# Patient Record
Sex: Male | Born: 1950 | Race: Black or African American | Hispanic: No | Marital: Married | Smoking: Former smoker
Health system: Southern US, Community
[De-identification: ages and names within clinical notes are randomized; demographics above are authoritative.]

## PROBLEM LIST (undated history)

## (undated) DIAGNOSIS — E785 Hyperlipidemia, unspecified: Secondary | ICD-10-CM

## (undated) DIAGNOSIS — I10 Essential (primary) hypertension: Secondary | ICD-10-CM

## (undated) DIAGNOSIS — Z720 Tobacco use: Secondary | ICD-10-CM

## (undated) DIAGNOSIS — E0590 Thyrotoxicosis, unspecified without thyrotoxic crisis or storm: Secondary | ICD-10-CM

## (undated) HISTORY — DX: Thyrotoxicosis, unspecified without thyrotoxic crisis or storm: E05.90

## (undated) HISTORY — DX: Hyperlipidemia, unspecified: E78.5

## (undated) HISTORY — PX: DENTAL SURGERY: SHX609

## (undated) HISTORY — PX: OTHER SURGICAL HISTORY: SHX169

## (undated) HISTORY — DX: Essential (primary) hypertension: I10

## (undated) HISTORY — DX: Tobacco use: Z72.0

---

## 2010-10-16 ENCOUNTER — Encounter: Payer: Self-pay | Admitting: Internal Medicine

## 2010-10-22 LAB — CONVERTED CEMR LAB
AST: 29 units/L
Albumin: 5 g/dL
Cholesterol: 279 mg/dL
Glucose, Bld: 144 mg/dL
HDL: 31 mg/dL
Hgb A1c MFr Bld: 8.9 %
PSA: 1.1 ng/mL
Protein, U semiquant: 53
Specific Gravity, Urine: 1.029
Total Protein: 7.3 g/dL
Triglyceride fasting, serum: 390 mg/dL

## 2010-11-08 ENCOUNTER — Ambulatory Visit: Payer: Self-pay | Admitting: Internal Medicine

## 2010-11-08 DIAGNOSIS — J309 Allergic rhinitis, unspecified: Secondary | ICD-10-CM | POA: Insufficient documentation

## 2010-11-08 DIAGNOSIS — E1169 Type 2 diabetes mellitus with other specified complication: Secondary | ICD-10-CM | POA: Insufficient documentation

## 2010-11-08 DIAGNOSIS — M1990 Unspecified osteoarthritis, unspecified site: Secondary | ICD-10-CM | POA: Insufficient documentation

## 2010-11-08 DIAGNOSIS — E785 Hyperlipidemia, unspecified: Secondary | ICD-10-CM | POA: Insufficient documentation

## 2010-11-08 DIAGNOSIS — K219 Gastro-esophageal reflux disease without esophagitis: Secondary | ICD-10-CM | POA: Insufficient documentation

## 2011-01-09 NOTE — Assessment & Plan Note (Signed)
Summary: New / Bcbs/ # cd   Vital Signs:  Patient profile:   61 year old male Height:      71 inches Weight:      252.50 pounds BMI:     35.34 O2 Sat:      97 % on Room air Temp:     98.5 degrees F oral Pulse rate:   64 / minute Pulse rhythm:   regular Resp:     16 per minute BP sitting:   126 / 80  (left arm) Cuff size:   large  Vitals Entered By: Rock Nephew CMA (November 08, 2010 10:15 AM)  Nutrition Counseling: Patient's BMI is greater than 25 and therefore counseled on weight management options.  O2 Flow:  Room air CC: New to establish Is Patient Diabetic? Yes Did you bring your meter with you today? No Pain Assessment Patient in pain? no       Does patient need assistance? Functional Status Self care Ambulation Normal   Primary Care Provider:  Etta Grandchild MD  CC:  New to establish.  History of Present Illness: New to me he was found to have Type II DM on recent labs done for a life insurance physical ( see scanned documents). He feels well and has no complaints today.  Preventive Screening-Counseling & Management  Alcohol-Tobacco     Alcohol drinks/day: 0     Alcohol Counseling: not indicated; patient does not drink     Smoking Status: current     Tobacco Counseling: to quit use of tobacco products  Caffeine-Diet-Exercise     Does Patient Exercise: yes  Hep-HIV-STD-Contraception     Hepatitis Risk: no risk noted     HIV Risk: no risk noted     STD Risk: no risk noted      Sexual History:  currently monogamous.        Drug Use:  no.        Blood Transfusions:  no.    Clinical Review Panels:  Prevention   Last Colonoscopy:  Normal (08/24/2008)   Last PSA:  1.1 (10/22/2010)  Lipid Management   Cholesterol:  279 (10/22/2010)   LDL (bad choesterol):  170 (10/22/2010)   HDL (good cholesterol):  31 (10/22/2010)   Triglycerides:  390 (10/22/2010)  Diabetes Management   HgBA1C:  8.9 (10/22/2010)   Creatinine:  1.0 (10/22/2010)   Last  Foot Exam:  yes (11/08/2010)  Complete Metabolic Panel   Glucose:  144 (10/22/2010)   BUN:  10 (10/22/2010)   Creatinine:  1.0 (10/22/2010)   Albumin:  5.0 (10/22/2010)   Total Protein:  7.3 (10/22/2010)   Total Bili:  0.4 (10/22/2010)   Alk Phos:  67 (10/22/2010)   SGPT (ALT):  31 (10/22/2010)   SGOT (AST):  29 (10/22/2010)   -  Date:  10/22/2010    BUN: 10    Creatinine: 1.0    Cholesterol: 279    LDL: 170    HDL: 31    Triglycerides: 161    HgbA1c: 8.9    Alk Phos: 67    SGPT (ALT): 31    SGOT (AST): 29    GGT: 36    Bilirubin-total: 0.4    BG Random: 144    Total Protein: 7.3    Albumin: 5.0    CMP Comment: cholesterol ratio- 9.0, Fructosamine-2.2, LDL/HDL ratio-5.48, Globulin 2.3,     Specific Gravity: 1.029    Protein: 53    PSA: 1.1    Urinalysis  Comments: Cotinine- 1.09,cocaine -Neg, Microalbumin/creatin-198, Protein/creatinine 0.3533, Microalbuminuria 29.7, Adult Creatinine-150.0   Medications Prior to Update: 1)  None  Current Medications (verified): 1)  Janumet 50-500 Mg Tabs (Sitagliptin-Metformin Hcl) .... One By Mouth Two Times A Day For Diabetes 2)  Crestor 20 Mg Tabs (Rosuvastatin Calcium) .... One By Mouth Once Daily For Cholesterol 3)  Onetouch Ultra 2 W/device Kit (Blood Glucose Monitoring Suppl) .... Use Two Times A Day As Directed  Allergies (verified): No Known Drug Allergies  Past History:  Past Medical History: Allergic rhinitis GERD Hyperlipidemia Osteoarthritis  Past Surgical History: Denies surgical history  Family History: Family History of Alcoholism/Addiction Family History Hypertension Family History of diabetes Family History of Heart disease Family History of Colon CA 1st degree relative age 54  Social History: Occupation: truck Hospital doctor Married Current Smoker Alcohol use-no Drug use-no Regular exercise-yes Smoking Status:  current Hepatitis Risk:  no risk noted HIV Risk:  no risk noted STD Risk:  no risk  noted Sexual History:  currently monogamous Blood Transfusions:  no Drug Use:  no Does Patient Exercise:  yes  Review of Systems       The patient complains of weight gain.  The patient denies anorexia, fever, weight loss, chest pain, syncope, dyspnea on exertion, peripheral edema, prolonged cough, headaches, hemoptysis, abdominal pain, melena, hematochezia, severe indigestion/heartburn, hematuria, and suspicious skin lesions.   Endo:  Denies cold intolerance, excessive hunger, excessive thirst, excessive urination, heat intolerance, polyuria, and weight change.  Physical Exam  General:  alert, well-developed, well-nourished, well-hydrated, appropriate dress, normal appearance, healthy-appearing, cooperative to examination, and overweight-appearing.   Head:  normocephalic, atraumatic, no abnormalities observed, and no abnormalities palpated.   Eyes:  vision grossly intact, pupils equal, and no injection.   Nose:  External nasal examination shows no deformity or inflammation. Nasal mucosa are pink and moist without lesions or exudates. Mouth:  pharynx pink and moist, no erythema, no exudates, no posterior lymphoid hypertrophy, no postnasal drip, no pharyngeal crowing, no lesions, no aphthous ulcers, no erosions, no tongue abnormalities, no leukoplakia, no petechiae, and edentulous.   Neck:  supple, full ROM, no masses, no thyromegaly, no JVD, normal carotid upstroke, no carotid bruits, no cervical lymphadenopathy, and no neck tenderness.   Lungs:  normal respiratory effort, no intercostal retractions, no accessory muscle use, normal breath sounds, no dullness, no fremitus, no crackles, and no wheezes.   Heart:  normal rate, regular rhythm, no murmur, no gallop, and no rub.   Abdomen:  soft, non-tender, normal bowel sounds, no distention, no masses, no guarding, no rigidity, no rebound tenderness, no abdominal hernia, no inguinal hernia, no hepatomegaly, and no splenomegaly.   Msk:  normal ROM,  no joint tenderness, no joint swelling, no joint warmth, no redness over joints, no joint deformities, no joint instability, and no crepitation.   Pulses:  R and L carotid,radial,femoral,dorsalis pedis and posterior tibial pulses are full and equal bilaterally Extremities:  No clubbing, cyanosis, edema, or deformity noted with normal full range of motion of all joints.   Neurologic:  No cranial nerve deficits noted. Station and gait are normal. Plantar reflexes are down-going bilaterally. DTRs are symmetrical throughout. Sensory, motor and coordinative functions appear intact. Skin:  turgor normal, color normal, no rashes, no suspicious lesions, no ecchymoses, no petechiae, no purpura, no ulcerations, and no edema.   Cervical Nodes:  no anterior cervical adenopathy and no posterior cervical adenopathy.   Axillary Nodes:  no R axillary adenopathy and no L axillary adenopathy.  Inguinal Nodes:  no R inguinal adenopathy and no L inguinal adenopathy.   Psych:  Cognition and judgment appear intact. Alert and cooperative with normal attention span and concentration. No apparent delusions, illusions, hallucinations  Diabetes Management Exam:    Foot Exam (with socks and/or shoes not present):       Sensory-Pinprick/Light touch:          Left medial foot (L-4): normal          Left dorsal foot (L-5): normal          Left lateral foot (S-1): normal          Right medial foot (L-4): normal          Right dorsal foot (L-5): normal          Right lateral foot (S-1): normal       Sensory-Monofilament:          Left foot: normal          Right foot: normal       Inspection:          Left foot: normal          Right foot: normal       Nails:          Left foot: normal          Right foot: normal   Impression & Recommendations:  Problem # 1:  DIABETES-TYPE 2 (ICD-250.00) Assessment New  His updated medication list for this problem includes:    Janumet 50-500 Mg Tabs (Sitagliptin-metformin hcl)  ..... One by mouth two times a day for diabetes  Orders: Diabetic Clinic Referral (Diabetic) Ophthalmology Referral (Ophthalmology)  Labs Reviewed: Creat: 1.0 (10/22/2010)    Reviewed HgBA1c results: 8.9 (10/22/2010)  Problem # 2:  HYPERLIPIDEMIA (ICD-272.4) Assessment: New  His updated medication list for this problem includes:    Crestor 20 Mg Tabs (Rosuvastatin calcium) ..... One by mouth once daily for cholesterol  Labs Reviewed: SGOT: 29 (10/22/2010)   SGPT: 31 (10/22/2010)   HDL:31 (10/22/2010)  LDL:170 (10/22/2010)  Chol:279 (10/22/2010)  Trig:390 (10/22/2010)  Problem # 3:  GERD (ICD-530.81) Assessment: Improved  His updated medication list for this problem includes:    Prevacid 24hr 15 Mg Cpdr (Lansoprazole) ..... One by mouth once daily  Complete Medication List: 1)  Janumet 50-500 Mg Tabs (Sitagliptin-metformin hcl) .... One by mouth two times a day for diabetes 2)  Crestor 20 Mg Tabs (Rosuvastatin calcium) .... One by mouth once daily for cholesterol 3)  Onetouch Ultra 2 W/device Kit (Blood glucose monitoring suppl) .... Use two times a day as directed 4)  Prevacid 24hr 15 Mg Cpdr (Lansoprazole) .... One by mouth once daily  Colorectal Screening:  Colonoscopy Results:    Date of Exam: 08/24/2008    Results: Normal  PSA Screening:    PSA: 1.1  (10/22/2010)  Patient Instructions: 1)  Please schedule a follow-up appointment in 2 months. 2)  It is important that you exercise regularly at least 20 minutes 5 times a week. If you develop chest pain, have severe difficulty breathing, or feel very tired , stop exercising immediately and seek medical attention. 3)  You need to lose weight. Consider a lower calorie diet and regular exercise.  4)  Check your blood sugars regularly. If your readings are usually above 200 or below 70 you should contact our office. 5)  It is important that your Diabetic A1c level is checked every 3 months. 6)  See  your eye doctor yearly to  check for diabetic eye damage. 7)  Check your feet each night for sore areas, calluses or signs of infection. 8)  Check your Blood Pressure regularly. If it is above 130/80: you should make an appointment. Prescriptions: ONETOUCH ULTRA 2 W/DEVICE KIT (BLOOD GLUCOSE MONITORING SUPPL) Use two times a day as directed  #1 x 0   Entered and Authorized by:   Etta Grandchild MD   Signed by:   Etta Grandchild MD on 11/08/2010   Method used:   Samples Given   RxID:   [id number] CRESTOR 20 MG TABS (ROSUVASTATIN CALCIUM) one by mouth once daily for cholesterol  #77 x 0   Entered and Authorized by:   Etta Grandchild MD   Signed by:   Etta Grandchild MD on 11/08/2010   Method used:   Samples Given   RxID:   [id number] JANUMET 50-500 MG TABS (SITAGLIPTIN-METFORMIN HCL) one by mouth two times a day for diabetes  #112 x 0   Entered and Authorized by:   Etta Grandchild MD   Signed by:   Etta Grandchild MD on 11/08/2010   Method used:   Samples Given   RxID:   [id number]    Orders Added: 1)  Diabetic Clinic Referral [Diabetic] 2)  Ophthalmology Referral [Ophthalmology] 3)  New Patient Level IV [99204]   Not Administered:    Tetanus Vaccine not given due to: declined    Pneumonia Vaccine not given due to: declined    Influenza Vaccine not given due to: declined

## 2011-01-23 ENCOUNTER — Ambulatory Visit: Payer: Self-pay | Admitting: Internal Medicine

## 2011-11-23 ENCOUNTER — Ambulatory Visit (INDEPENDENT_AMBULATORY_CARE_PROVIDER_SITE_OTHER): Payer: Self-pay | Admitting: Internal Medicine

## 2011-11-23 ENCOUNTER — Encounter: Payer: Self-pay | Admitting: Internal Medicine

## 2011-11-23 VITALS — BP 161/99 | HR 86 | Temp 97.3°F | Ht 71.0 in | Wt 250.6 lb

## 2011-11-23 DIAGNOSIS — E785 Hyperlipidemia, unspecified: Secondary | ICD-10-CM

## 2011-11-23 DIAGNOSIS — I10 Essential (primary) hypertension: Secondary | ICD-10-CM

## 2011-11-23 DIAGNOSIS — E119 Type 2 diabetes mellitus without complications: Secondary | ICD-10-CM

## 2011-11-23 DIAGNOSIS — L03019 Cellulitis of unspecified finger: Secondary | ICD-10-CM | POA: Insufficient documentation

## 2011-11-23 MED ORDER — AMOXICILLIN 500 MG PO TABS: 500.0000 mg | ORAL_TABLET | Freq: Two times a day (BID) | ORAL | Status: DC

## 2011-11-23 MED ORDER — LISINOPRIL 20 MG PO TABS: 20.0000 mg | ORAL_TABLET | Freq: Every day | ORAL | Status: DC

## 2011-11-23 MED ORDER — GLIPIZIDE 10 MG PO TABS: 10.0000 mg | ORAL_TABLET | Freq: Every day | ORAL | Status: DC

## 2011-11-23 MED ORDER — ASPIRIN EC 81 MG PO TBEC: 81.0000 mg | DELAYED_RELEASE_TABLET | Freq: Every day | ORAL | Status: DC

## 2011-11-23 MED ORDER — METFORMIN HCL 1000 MG PO TABS: 1000.0000 mg | ORAL_TABLET | Freq: Two times a day (BID) | ORAL | Status: DC

## 2011-11-23 MED ORDER — DOXYCYCLINE HYCLATE 100 MG PO TABS: 100.0000 mg | ORAL_TABLET | Freq: Two times a day (BID) | ORAL | Status: DC

## 2011-11-23 MED ORDER — PRAVASTATIN SODIUM 40 MG PO TABS: 40.0000 mg | ORAL_TABLET | Freq: Every day | ORAL | Status: DC

## 2011-11-23 MED ORDER — GLIPIZIDE 5 MG PO TABS: 5.0000 mg | ORAL_TABLET | Freq: Two times a day (BID) | ORAL | Status: DC

## 2011-11-23 NOTE — Progress Notes (Signed)
Addended by: Lyn Hollingshead C on: 11/23/2011 03:12 PM   Modules accepted: Orders

## 2011-11-23 NOTE — Assessment & Plan Note (Signed)
Lab Results  Component Value Date   HGBA1C 10.8 11/23/2011   HGBA1C 8.9 10/22/2010   CREATININE 1.0 10/22/2010   CHOL 279 10/22/2010   HDL 31 10/22/2010    Last eye exam and foot exam: No results found for this basename: HMDIABEYEEXA, HMDIABFOOTEX    Assessment: Diabetes control: not controlled Progress toward goals: deteriorated Barriers to meeting goals: nonadherence to medications  Plan: Diabetes treatment: Start on metformin thousand milligram by mouth twice a day and glipizide 5 mg by mouth daily. I would refer to [facility]- but would wait until he has orange card. I did do referral today already. Refer to: diabetes educator for self-management training, diabetes educator for medical nutrition therapy and nutritionist Instruction/counseling given: reminded to get eye exam, reminded to bring medications to each visit and discussed the need for weight loss

## 2011-11-23 NOTE — Assessment & Plan Note (Addendum)
Lab Results  Component Value Date   BUN 10 10/22/2010   CREATININE 1.0 10/22/2010    BP Readings from Last 3 Encounters:  11/23/11 161/99  11/08/10 126/80    Assessment: Hypertension control:  moderately elevated  Progress toward goals:  deteriorated Barriers to meeting goals:  nonadherence to medications  Plan: Hypertension treatment:  Start on lisinopril 20 mg daily considering his diabetes. I will check BMP today and due to problem with needle sticks- I will see him back in 3 months and we'll recheck his creatinine at that time- provided his creatinine today is within normal limits.

## 2011-11-23 NOTE — Assessment & Plan Note (Signed)
Check lipid profile today. Start Pravachol 40 mg daily.

## 2011-11-23 NOTE — Patient Instructions (Signed)
Please make an appointment in 3 months.  Finger infection:   Soak finger in warm water for about 10 minutes every 3 hours. Also start taking amoxicillin 500 mg one tablet twice daily and doxycycline 100 mg one tablet twice daily- both for total of 7 days. If the finger starts having more or gets swollen or red and tense again, give [name] a call to make an early appointment.  Diabetes :              Start taking metformin 1000 mg one tablet at breakfast and one tablet at supper.              Also start taking glipizide 5 mg one tablet at breakfast for diabetes.  High blood pressure Start taking lisinopril 20 mg one tablet daily.  High cholesterol  Start taking Pravachol- 40 mg one tablet daily.

## 2011-11-23 NOTE — Assessment & Plan Note (Signed)
As described in history of present illness, Carlos Bowman has acute paronychia of right middle finger- which is getting better. He soaked his finger in Epsom salt at home. 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 Also advised him to soak his finger in warm water for 10 minutes every 3 hours. No indication for surgery at present- but described him and his wife that if it gets worse give [name] a call and we'll do the surgical referral.

## 2011-11-23 NOTE — Progress Notes (Signed)
  Subjective:    Patient ID: Carlos Bowman, male    DOB: Jan 04, 1951, 60 y.o.   MRN: [id number]  HPI Carlos Bowman is a pleasant 60 year old man who comes to the clinic as a new patient, has history of DM 2, hyper lipidemia, osteoarthritis. He comes today with distal right middle finger pain and swelling.  He noticed the pain and swelling along with redness of his right middle finger on medial side of his nailbed on last Sunday- it got bigger and tense and more red and was tender to touch until this morning. Today he feels much better and the pain is down, is less tense than before and less red. He denies any fever, chills, nausea, vomiting. He did try to stick needle at the prominent white part on the medial side of the nail bed- thinking to get the pus out- but was unsuccessful. He used alcohol to sterilize the needle and skin before doing it. Advised him not to do in again in future.  He has history of diabetes with last HbA1c of 8.7 in November 2011. Rechecked today- 10.8. Has history of hyperlipidemia- last total cholesterol 279 and LDL cholesterol 170 with HDL 31. He is not on any medications for past 6 months or so due to financial issues.  He is really afraid of needles sticks- since early age when he had few incidences of needles breaking into the skin.  Since then he tries to avoid lab tests if possible and also does not take any immunizations.  He is accompanied by his wife today.  He denies any nausea vomiting, abdominal pain, diarrhea, constipation, headache, chest pain, shortness of breath or recent weight loss.   Review of Systems    as per history of present illness, all other systems reviewed and negative. Objective:   Physical Exam  Vitals: Reviewed. General: resting in bed HEENT: PERRL, EOMI, no scleral icterus Cardiac: S1, S2, RRR, no rubs, murmurs or gallops Pulm: clear to auscultation bilaterally, moving normal volumes of air Abd: soft, nontender, nondistended, BS  present Ext: Right middle finger- distal acute paronychia - medial side of nailbed , minimal redness and minimal pain. Not tense. No open wound . Neuro: alert and oriented X3, cranial nerves II-XII grossly intact, strength and sensation to light touch equal in bilateral upper and lower extremities       Assessment & Plan:

## 2011-11-24 LAB — LIPID PANEL
Cholesterol: 274 mg/dL — ABNORMAL HIGH (ref 0–200)
Total CHOL/HDL Ratio: 9.1 Ratio

## 2011-11-24 LAB — BASIC METABOLIC PANEL WITH GFR
Chloride: 101 mEq/L (ref 96–112)
GFR, Est African American: >89 mL/min
GFR, Est Non African American: 88 mL/min
Potassium: 4.6 mEq/L (ref 3.5–5.3)
Sodium: 136 mEq/L (ref 135–145)

## 2011-11-26 ENCOUNTER — Telehealth: Payer: Self-pay | Admitting: Dietician

## 2011-11-26 NOTE — Telephone Encounter (Signed)
Thank you very much. Carlos Bowman

## 2011-11-26 NOTE — Telephone Encounter (Signed)
Appointment scheduled for 12/16/10. Patient to bring wife and also paperwork to complete application for Financial assistance.

## 2011-11-30 ENCOUNTER — Encounter: Payer: Self-pay | Admitting: Internal Medicine

## 2011-11-30 ENCOUNTER — Ambulatory Visit (INDEPENDENT_AMBULATORY_CARE_PROVIDER_SITE_OTHER): Payer: Self-pay | Admitting: Internal Medicine

## 2011-11-30 DIAGNOSIS — E119 Type 2 diabetes mellitus without complications: Secondary | ICD-10-CM

## 2011-11-30 DIAGNOSIS — L03019 Cellulitis of unspecified finger: Secondary | ICD-10-CM

## 2011-11-30 DIAGNOSIS — I10 Essential (primary) hypertension: Secondary | ICD-10-CM

## 2011-11-30 NOTE — Assessment & Plan Note (Signed)
Lab Results  Component Value Date   NA 136 11/23/2011   K 4.6 11/23/2011   CL 101 11/23/2011   CO2 25 11/23/2011   BUN 11 11/23/2011   CREATININE 0.93 11/23/2011   CREATININE 1.0 10/22/2010    BP Readings from Last 3 Encounters:  11/30/11 150/100  11/23/11 161/99  11/08/10 126/80    Assessment: Hypertension control:  mildly elevated  Progress toward goals:  improved Barriers to meeting goals:  no barriers identified  Plan: Hypertension treatment:  continue current medications

## 2011-11-30 NOTE — Assessment & Plan Note (Signed)
As described in physical exam, superficial incision and drainage of right middle finger nail base was done by Dr. Phillips Odor. Consent was obtained. Alcohol for was used to sterilize the area and small stab incision was made with #11 scalpel. Pus was drained out and was irrigated with normal saline and was dry dressed.  Recommend him to wash the area daily with warm soap water 2 to 3 times and dress it with dry dressing.  Will see him back if it gets worse.

## 2011-11-30 NOTE — Assessment & Plan Note (Signed)
Started taking all his medications. Reports that CBGs are better- 140s now.  Is going to see Norm Parcel on 12/17/2011.  will call him back inin 3 months for repeat A1c.

## 2011-11-30 NOTE — Patient Instructions (Signed)
Followup with appointment with Norm Parcel.  Take all your medications regularly.  Wash the finger with warm soap water 2-3 times a day and dress it with dry dressing. If you have increased pain, swelling-fever and chills since give [name] a call to make an early appointment.

## 2011-11-30 NOTE — Progress Notes (Signed)
  Subjective:    Patient ID: Jaelyn Cloninger, male    DOB: 07/08/1951, 60 y.o.   MRN: [id number]  HPI Mr. Wenner is a pleasant 60 year old man with past history of DM 2, hyper lipidemia, hypertension who comes the clinic for followup visit for his acute paronychia.  I saw him a week before - but he came to the clinic for the first time.  He was sent home on one week course of amoxicillin and doxycycline for his right middle finger paronychia. He feels better today and the pain has disappeared. But has a pocket of pus collection at the base of nail of right middle finger. He denies any fever, chills, nausea vomiting or night sweats.  He is afraid of needles and sticks- due to childhood bad experiences with needles.  He started all his medications which are prescribed during last visit- namely metformin, glipizide, lisinopril, Pravachol, aspirin and also completed his 7 day course of antibiotics.  He is going to see Norm Parcel on 12/16/2010.    Review of Systems    as per history of present illness, all other systems reviewed and negative. Objective:   Physical Exam  General: NAD HEENT: PERRL, EOMI, no scleral icterus Cardiac: S1, S2, RRR, no rubs, murmurs or gallops Pulm: clear to auscultation bilaterally, moving normal volumes of air Abd: soft, nontender, nondistended, BS present Ext: Right middle finger swelling little better than last visit except pocket of pus collection at base of right middle nail . No tenderness to palpation.  Post procedure exam: After superficial incision and drainage of the pus collection- the pus was successfully drained without any complications. Neuro: alert and oriented X3, cranial nerves II-XII grossly intact       Assessment & Plan:

## 2011-12-17 ENCOUNTER — Ambulatory Visit (INDEPENDENT_AMBULATORY_CARE_PROVIDER_SITE_OTHER): Payer: Self-pay | Admitting: Dietician

## 2011-12-17 DIAGNOSIS — E119 Type 2 diabetes mellitus without complications: Secondary | ICD-10-CM

## 2011-12-17 NOTE — Patient Instructions (Signed)
Please make a follow up appointment in 3-4 weeks.  Try to eat at least 3-5 times a day keeping your carbohydrates consistent- (around the same amount each time you eat)  Call with any questions about your diabetes

## 2011-12-17 NOTE — Progress Notes (Signed)
Diabetes Self-Management Training (DSMT)  Initial Visit  12/17/2011 [name], identified by name and date of birth, is a 61 y.o. male with Type 2 Diabetes. Year of diabetes diagnosis: 2011 Other persons present: spouse/SO  ASSESSMENT Patient concerns are Healthy Lifestyle, Glycemic control and Weight control.  Height 5' 10.5" (1.791 m), weight 255 lb 1.6 oz (115.713 kg). Body mass index is 36.09 kg/(m^2). Lab Results  Component Value Date   LDLCALC Comment:   Not calculated due to Triglyceride >400. Suggest ordering Direct LDL (Unit Code: 56213).   Total Cholesterol/HDL Ratio:CHD Risk                        Coronary Heart Disease Risk Table                                        Men       Women          1/2 Average Risk              3.4        3.3              Average Risk              5.0        4.4           2X Average Risk              9.6        7.1           3X Average Risk             23.4       11.0 Use the calculated Patient Ratio above and the CHD Risk table  to determine the patient's CHD Risk. ATP III Classification (LDL):       < 100        mg/dL         Optimal      086 - 129     mg/dL         Near or Above Optimal      130 - 159     mg/dL         Borderline High      160 - 189     mg/dL         High       > 578        mg/dL         Very High   46/96/2952   Lab Results  Component Value Date   HGBA1C 10.8 11/23/2011   Medication Nutrition Monitor: one touch ultra 2  Labs reviewed.  DIABETES BUNDLE: A1C in past 6 months? Yes.  Less than 7%? No LDL in past year? Yes.  Less than 100 mg/dL? No Microalbumin ratio in past year? No. Patient taking ACE or ARB? Yes. Blood pressure less than 130/80? No.  Sent note to MD. Foot exam in last year? No. Sent note to MD. Eye exam in past year? No.  Reminded patient to schedule eye exam. Tobacco use? No. Pneumovax? No Flu vaccine? No Asprin? Yes  Family history of diabetes: No Support systems: spouse friends Special needs:  None Patients belief/attitude about diabetes: Diabetes can be controlled. Self foot exams daily: No Diabetes Complications: None Prior DM Education: No  Medications See Medications list.  Has adequate knowledge and Is interested in learning more   Exercise Plan Doing physical/active work and walking for 30 minutesa day.   Self-Monitoring Frequency of testing: 1-2 times/day Breakfast: 80-105 is late- 11-1 PM Lunch- skips Supper: 120-151 after meal 7-10 PM Bedtime: 94  Hyperglycemia: Yes Rarely Hypoglycemia: Yes Rarely at blood sugar of 80 mg/dl   Meal Planning Some knowledge   Assessment comments: wife with patient and very supportive. Patient seems serious about caring for his diabetes. He is using the after meal flag in his meter. His goal seems to be a1c< 6.5% and weight loss.  ( recommended by AACE) He may need to back off glipizide to as he learns how to eat to improve his diabetes control and starts near future exercise program of walking with his wife.      INDIVIDUAL DIABETES EDUCATION PLAN:  Diabetes disease state Nutrition management Physical activity and exercise Medication Monitoring Acute complication: Chronic complications Goal setting _______________________________________________________________________  Intervention TOPICS COVERED TODAY:  Diabetes disease state Definition of diabetes, type 1 and 2, and the diagnosis of diabetes. Explored patient's options for treatment of their diabetes. Nutrition management  Role of diet in the treatment of diabetes and the relationship between the three main macronutritents and blood glucose control. Food label reading, portion sizes and measuring food. very litte label readings today- did show him one serving carb is 15 grams. used Living with diabetes and basic carb coutjnhign pamphlet Monitoring  Taught/discussed recording of test results and interpretation of SMBG. Identified appropriate SMBG and A1C  goals.  PATIENTS GOALS/PLAN (copy and paste in patient instructions so patient receives a copy): 1.  Learning Objective:       Know what makes blood sugar go up and down 2.  Behavioral Objective:         Nutrition: To improve blood glucose control I will follow meal plan of consistent carbs throughout day Half of the time 50% Medications: To improve blood glucose levels, I will take my medication as prescribed Most of the time 75% Monitoring: To identify blood glucose trends, I will bring meter to all visits Most of the time 75%  Personalized Follow-Up Plan for Ongoing Self Management Support:  Doctor's Office, church, friends, family and CDE visits ______________________________________________________________________   Outcomes Expected outcomes: Demonstrated interest in learning.Expect positive changes in lifestyle.  Self-care Barriers: None  Education material provided: living with diabetes and basic carb counting Patient to contact team via Phone if problems or questions. Time in: 1330     Time out: 1430 Future DSMT - 4-6 wks   Plyler, Lupita Leash

## 2012-02-20 ENCOUNTER — Ambulatory Visit (INDEPENDENT_AMBULATORY_CARE_PROVIDER_SITE_OTHER): Payer: Self-pay | Admitting: Internal Medicine

## 2012-02-20 ENCOUNTER — Encounter: Payer: Self-pay | Admitting: Internal Medicine

## 2012-02-20 VITALS — BP 146/86 | HR 94 | Temp 97.8°F | Ht 71.0 in | Wt 253.9 lb

## 2012-02-20 DIAGNOSIS — Z79899 Other long term (current) drug therapy: Secondary | ICD-10-CM

## 2012-02-20 DIAGNOSIS — E119 Type 2 diabetes mellitus without complications: Secondary | ICD-10-CM

## 2012-02-20 DIAGNOSIS — E785 Hyperlipidemia, unspecified: Secondary | ICD-10-CM

## 2012-02-20 DIAGNOSIS — I10 Essential (primary) hypertension: Secondary | ICD-10-CM

## 2012-02-20 LAB — POCT GLYCOSYLATED HEMOGLOBIN (HGB A1C): Hemoglobin A1C: 6.8

## 2012-02-20 LAB — GLUCOSE, CAPILLARY: Glucose-Capillary: 88 mg/dL (ref 70–99)

## 2012-02-20 MED ORDER — GLIPIZIDE 5 MG PO TABS: 5.0000 mg | ORAL_TABLET | Freq: Two times a day (BID) | ORAL | Status: DC

## 2012-02-20 NOTE — Progress Notes (Signed)
  Subjective:    Patient ID: Carlos Bowman, male    DOB: 12-26-1950, 61 y.o.   MRN: [id number]  HPI Carlos Bowman is a pleasant 61 year old gentleman with past history of DM 2, hyper lipidemia, GERD, hypertension who comes the clinic for diabetes followup and sinus congestion for past 5 days.  He complains of nasal congestion with postnasal drip and cough with thick light yellow sputum since last Saturday. He reports improvement in his symptoms and feels much better today compared to yesterday.  He does complain of chronic seasonal allergic sinusitis- which gets worse with season changes.  Had some mild chills but denies any fever, swelling, palpitations, headache, vision changes. He also denies any chest pain, short of breath, abdominal pain, diarrhea, recurrent fingernail infection.  He takes all his medications regularly and checks his sugars intermittently.  His blood pressure today is 146/86 and hemoglobin A1c is 6.8 today from 10.4 in December 2012. His glucose meter shows lowest reading of 62 in first week of February-with highest being 159. He denies any significant hypoglycemic symptoms including dizziness or loss of consciousness or confusion.    Review of Systems    as per history of present illness, all other systems reviewed and negative. Objective:   Physical Exam  General: NAD HEENT: PERRL, EOMI, no scleral icterus. TM normal bilaterally. Right ear wax present. Cardiac: RRR, no rubs, murmurs or gallops Pulm: clear to auscultation bilaterally, moving normal volumes of air Abd: soft, nontender, nondistended, BS present Ext: warm and well perfused, no pedal edema Neuro: alert and oriented X3, cranial nerves II-XII grossly intact       Assessment & Plan:

## 2012-02-20 NOTE — Patient Instructions (Signed)
Please make a followup appointment in 3-4 months for diabetes check up.  Your hemoglobin A1c number is 6.8 today which is improved from 10 in December- which is really good.  Decrease the dose of Glipizide to 5 mg twice a day before meals. Keep taking metformin 1000 mg twice a day as you do.  Take your aspirin, Pravachol and lisinopril at similar doses.  You can take over-the-counter Benadryl or Claritin or Zyrtec or Allegra for sinus congestion and allergies.

## 2012-02-20 NOTE — Assessment & Plan Note (Signed)
Lab Results  Component Value Date   HGBA1C 6.8 02/20/2012   HGBA1C 8.9 10/22/2010   CREATININE 0.93 11/23/2011   CREATININE 1.0 10/22/2010   CHOL 274* 11/23/2011   HDL 30* 11/23/2011   TRIG 119* 11/23/2011    Last eye exam and foot exam: No results found for this basename: HMDIABEYEEXA, HMDIABFOOTEX    Assessment: Diabetes control: controlled Progress toward goals: improved Barriers to meeting goals: no barriers identified  Plan: Diabetes treatment: Due to 1 hypoglycemic episode   with CBG 62 and 1 reading of 82 , I will decrease the dose of glipizide to 5 mg twice a day and continue metformin 1000 mg twice a day . Will check hemoglobin A1c in 3 months.  discussed with him about signs and symptoms of hypoglycemia and prompt management. He verbalized understanding. Refer to: none Instruction/counseling given: reminded to bring blood glucose meter & log to each visit, reminded to bring medications to each visit, discussed foot care and discussed diet

## 2012-02-20 NOTE — Assessment & Plan Note (Signed)
His last lipid profile shows high triglyceride in 500s and so LDL was not calculated. He is on Pravachol 40 mg daily.  I will recheck his lipid profile next visit and make appropriate changes in statin therapy.  If needed, will called him back just for a lab appointment for lipid profile- but he is uninsured and would probably do it during next visit which will put less burden on him financially.

## 2012-02-20 NOTE — Assessment & Plan Note (Addendum)
Lab Results  Component Value Date   NA 136 11/23/2011   K 4.6 11/23/2011   CL 101 11/23/2011   CO2 25 11/23/2011   BUN 11 11/23/2011   CREATININE 0.93 11/23/2011   CREATININE 1.0 10/22/2010    BP Readings from Last 3 Encounters:  02/20/12 146/86  11/30/11 150/100  11/23/11 161/99    Assessment: Hypertension control:  mildly elevated  Progress toward goals:  improved Barriers to meeting goals:  no barriers identified  Plan: Hypertension treatment:  continue current medications- lisinopril 20 mg daily.

## 2012-05-14 ENCOUNTER — Encounter: Payer: Self-pay | Admitting: Internal Medicine

## 2012-06-11 ENCOUNTER — Encounter: Payer: Self-pay | Admitting: Internal Medicine

## 2012-06-11 ENCOUNTER — Ambulatory Visit (INDEPENDENT_AMBULATORY_CARE_PROVIDER_SITE_OTHER): Payer: Self-pay | Admitting: Internal Medicine

## 2012-06-11 VITALS — BP 138/85 | HR 82 | Temp 98.4°F | Ht 71.0 in | Wt 253.3 lb

## 2012-06-11 DIAGNOSIS — Z79899 Other long term (current) drug therapy: Secondary | ICD-10-CM

## 2012-06-11 DIAGNOSIS — E119 Type 2 diabetes mellitus without complications: Secondary | ICD-10-CM

## 2012-06-11 DIAGNOSIS — I10 Essential (primary) hypertension: Secondary | ICD-10-CM

## 2012-06-11 LAB — GLUCOSE, CAPILLARY: Glucose-Capillary: 85 mg/dL (ref 70–99)

## 2012-06-11 LAB — POCT GLYCOSYLATED HEMOGLOBIN (HGB A1C): Hemoglobin A1C: 6.8

## 2012-06-11 MED ORDER — GLUCOSE BLOOD VI STRP: ORAL_STRIP | Status: DC

## 2012-06-11 NOTE — Progress Notes (Signed)
  Subjective:    Patient ID: Carlos Bowman, male    DOB: Dec 30, 1950, 61 y.o.   MRN: [id number]  HPI Mr. Stairs is a pleasant 61 year old man with past history of DM 2, hyper lipidemia, hypertension who comes in the clinic for followup visit for his chronic medical problems. He reports that his CBGs are staying low and he is experiencing hypoglycemic symptoms 2-3 times a week- feels dizzy, sweaty and anxious. His lowest CBG reading was 54 in April, 63 in may.  He is on metformin thousand milligram twice daily and glipizide 5 mg twice daily- although he stopped taking the evening dose of both medications for last one month and is feeling better after that.  His blood pressure is well-controlled.  He denies any chest pain, short of breath, abdominal pain, nausea, vomiting, increased urination, headache, palpitations. He wants prescription for glucose test strips.  Also was curious about interpretation of the glucose meter reading print out.    Review of Systems    is per history of present illness, all other systems reviewed and negative. Objective:   Physical Exam  General: resting in bed HEENT: PERRL, EOMI, no scleral icterus Cardiac: S1, S2, RRR, no rubs, murmurs or gallops Pulm: clear to auscultation bilaterally, moving normal volumes of air Abd: soft, nontender, nondistended, BS present Ext: warm and well perfused, no pedal edema Neuro: alert and oriented X3, cranial nerves II-XII grossly intact       Assessment & Plan:

## 2012-06-11 NOTE — Patient Instructions (Signed)
Please make a follow up appointment in 3-4 months for diabetes follow up.  Diabetes: Please stop taking Glipizide altogether. Continue taking Metformin 1000 mg 2 times daily for Diabetes. Please check your sugars when you feel that your sugars are low.  Also continue taking all other meds daily.  Call clinic or early appointment.

## 2012-06-11 NOTE — Assessment & Plan Note (Signed)
Blood pressure better controlled than before. At goal. Continue lisinopril 20 mg daily.

## 2012-06-11 NOTE — Assessment & Plan Note (Signed)
Patient had hypoglycemia and symptoms 2-3 times a week and so decided to stop evening dose of metformin and glipizide for last one month and is doing well after that. I discussed with him in detail about the likely cause of his hyperglycemia being glipizide. His HbA1c today 6.8 which is exactly similar to what it was in March 2013.  Plan: Stop glipizide due to hypoglycemia and symptoms. - Continue metformin at 1000 mg twice daily- he will start taking evening dose from today. - Recheck A1c in 3-4 months and adjustment of medications as needed. - He will call the clinic if his CBGs running high or low.

## 2012-09-17 ENCOUNTER — Encounter: Payer: Self-pay | Admitting: Internal Medicine

## 2012-09-17 ENCOUNTER — Ambulatory Visit (INDEPENDENT_AMBULATORY_CARE_PROVIDER_SITE_OTHER): Payer: Self-pay | Admitting: Internal Medicine

## 2012-09-17 VITALS — BP 131/81 | HR 78 | Temp 98.2°F | Ht 71.0 in | Wt 244.4 lb

## 2012-09-17 DIAGNOSIS — I10 Essential (primary) hypertension: Secondary | ICD-10-CM

## 2012-09-17 DIAGNOSIS — E785 Hyperlipidemia, unspecified: Secondary | ICD-10-CM

## 2012-09-17 DIAGNOSIS — Z79899 Other long term (current) drug therapy: Secondary | ICD-10-CM

## 2012-09-17 DIAGNOSIS — R0789 Other chest pain: Secondary | ICD-10-CM

## 2012-09-17 DIAGNOSIS — R071 Chest pain on breathing: Secondary | ICD-10-CM

## 2012-09-17 DIAGNOSIS — E119 Type 2 diabetes mellitus without complications: Secondary | ICD-10-CM

## 2012-09-17 LAB — POCT GLYCOSYLATED HEMOGLOBIN (HGB A1C): Hemoglobin A1C: 6.5

## 2012-09-17 MED ORDER — SIMVASTATIN 40 MG PO TABS: 40.0000 mg | ORAL_TABLET | Freq: Every evening | ORAL | Status: DC

## 2012-09-17 NOTE — Progress Notes (Signed)
  Subjective:    Patient ID: Carlos Bowman, male    DOB: 1951/03/13, 61 y.o.   MRN: [id number]  HPI patient is a pleasant 61 year old man with past history of DM 2, hyper lipidemia and hypertension who comes the clinic for followup visit. His sugar and A1c today 6.5. His blood pressure is at goal. He denies any short of breath, abdominal pain, nausea, vomiting, fever, chills, diarrhea. He does complain of intermittent right lower rib cage pain- which comes on when he goes to bed after work. Does not happen during work time. Does not get worse with food. Has no associated nausea or vomiting.     Review of Systems    As per history of present illness, all other systems reviewed and negative. Objective:   Physical Exam  Constitutional: Vital signs reviewed.  Patient is a well-developed and well-nourished in no acute distress and cooperative with exam. Alert and oriented x3.  Head: Normocephalic and atraumatic Mouth: no erythema or exudates, MMM Eyes: PERRL, EOMI, conjunctivae normal, No scleral icterus.  Neck: Supple, Trachea midline normal ROM, No JVD Cardiovascular: RRR, S1 normal, S2 normal, no MRG Pulmonary/Chest: CTAB, no wheezes, rales, or rhonchi Abdominal: Soft. Non-tender, non-distended, bowel sounds are normal Musculoskeletal: No joint deformities, erythema, or stiffness, ROM full and no nontender Neurological: A&O x3, Strength is normal and symmetric bilaterally, cranial nerve II-XII are grossly intact, no focal motor deficit, sensory intact to light touch bilaterally.  Skin: Warm, dry and intact. No rash, cyanosis, or clubbing.  Psychiatric: Normal mood and affect. speech and behavior is normal. Judgment and thought content normal. Cognition and memory are normal.          Assessment & Plan:

## 2012-09-17 NOTE — Patient Instructions (Addendum)
Please make a follow up appointment in 3-4 months for diabetes. Please take Ibuprofen 600 mg after dinner to try for the R chest/rib pain. Please continue same medications. Take Benadryl 25 mg 3 times a day as needed for sinus congestion.

## 2012-09-17 NOTE — Assessment & Plan Note (Signed)
Lab Results  Component Value Date   HGBA1C 6.5 09/17/2012   HGBA1C 8.9 10/22/2010   CREATININE 0.93 11/23/2011   CREATININE 1.0 10/22/2010   CHOL 274* 11/23/2011   HDL 30* 11/23/2011   TRIG 119* 11/23/2011    Last eye exam and foot exam: No results found for this basename: HMDIABEYEEXA, HMDIABFOOTEX    Assessment: Diabetes control: controlled Progress toward goals: at goal Barriers to meeting goals: no barriers identified  Plan: Diabetes treatment: continue current medications- metformin 1000 mg twice daily. Refer to: none Instruction/counseling given: reminded to bring blood glucose meter & log to each visit, reminded to bring medications to each visit and discussed diet

## 2012-09-17 NOTE — Assessment & Plan Note (Signed)
Lab Results  Component Value Date   NA 136 11/23/2011   K 4.6 11/23/2011   CL 101 11/23/2011   CO2 25 11/23/2011   BUN 11 11/23/2011   CREATININE 0.93 11/23/2011   CREATININE 1.0 10/22/2010    BP Readings from Last 3 Encounters:  09/17/12 131/81  06/11/12 138/85  02/20/12 146/86    Assessment: Hypertension control:  controlled  Progress toward goals:  improved Barriers to meeting goals:  no barriers identified  Plan: Hypertension treatment:  continue current medications. Lisinopril 20 mg daily.

## 2012-09-17 NOTE — Assessment & Plan Note (Addendum)
Right lower rib cage pain- intermittent, comes on while lying in bed. As described in history of present illness, is not associated with any nausea, vomiting, fever, chills or not associated with food intake. Also does not have any diarrhea.  Most likely musculoskeletal pain. Advised him to take ibuprofen 600 mg after dinner as needed. He is not Much of a medicine taker- but stated will try if needed. If pain continues, I will see him back and do further workup including possible chest x-ray.

## 2012-09-17 NOTE — Addendum Note (Signed)
Addended by: Lyn Hollingshead C on: 09/17/2012 05:44 PM   Modules accepted: Orders

## 2012-09-17 NOTE — Assessment & Plan Note (Addendum)
Discontinue Pravachol and start on Zocor 40 mg daily after discussing with Dr. Eben Burow. Will check lipid profile as soon as possible. Mamie will call the patient tomorrow morning. We'll possibly add ezetimibe / colestipol /fish oil as needed for hypertriglyceridemia.

## 2012-09-24 ENCOUNTER — Telehealth: Payer: Self-pay | Admitting: *Deleted

## 2012-09-24 NOTE — Telephone Encounter (Signed)
Pharmacy called new chol med is not a $4.00 med. Does have Bar Special. Suggest for pt to call [facility] pharmacy for med. Stanton Kidney Ajax Schroll RN 09/24/12 11:30AM

## 2012-11-24 ENCOUNTER — Other Ambulatory Visit: Payer: Self-pay | Admitting: Internal Medicine

## 2012-12-16 ENCOUNTER — Ambulatory Visit: Payer: Self-pay

## 2012-12-25 NOTE — Addendum Note (Signed)
Addended by: Remus Blake on: 12/25/2012 09:16 AM   Modules accepted: Orders

## 2012-12-31 ENCOUNTER — Ambulatory Visit (INDEPENDENT_AMBULATORY_CARE_PROVIDER_SITE_OTHER): Payer: No Typology Code available for payment source | Admitting: Internal Medicine

## 2012-12-31 ENCOUNTER — Encounter: Payer: Self-pay | Admitting: Internal Medicine

## 2012-12-31 ENCOUNTER — Ambulatory Visit: Payer: Self-pay

## 2012-12-31 VITALS — BP 136/86 | HR 96 | Temp 97.2°F | Ht 71.0 in | Wt 240.6 lb

## 2012-12-31 DIAGNOSIS — E785 Hyperlipidemia, unspecified: Secondary | ICD-10-CM

## 2012-12-31 DIAGNOSIS — R071 Chest pain on breathing: Secondary | ICD-10-CM

## 2012-12-31 DIAGNOSIS — E119 Type 2 diabetes mellitus without complications: Secondary | ICD-10-CM

## 2012-12-31 DIAGNOSIS — I10 Essential (primary) hypertension: Secondary | ICD-10-CM

## 2012-12-31 DIAGNOSIS — R0789 Other chest pain: Secondary | ICD-10-CM

## 2012-12-31 DIAGNOSIS — Z79899 Other long term (current) drug therapy: Secondary | ICD-10-CM

## 2012-12-31 LAB — POCT GLYCOSYLATED HEMOGLOBIN (HGB A1C): Hemoglobin A1C: 6.5

## 2012-12-31 LAB — LIPID PANEL
Cholesterol: 262 mg/dL — ABNORMAL HIGH (ref 0–200)
Total CHOL/HDL Ratio: 7.5 Ratio

## 2012-12-31 MED ORDER — ASPIRIN EC 81 MG PO TBEC: 81.0000 mg | DELAYED_RELEASE_TABLET | Freq: Every day | ORAL | Status: AC

## 2012-12-31 MED ORDER — LISINOPRIL 20 MG PO TABS: 20.0000 mg | ORAL_TABLET | Freq: Every day | ORAL | Status: DC

## 2012-12-31 MED ORDER — PRAVASTATIN SODIUM 40 MG PO TABS: 40.0000 mg | ORAL_TABLET | Freq: Every day | ORAL | Status: DC

## 2012-12-31 NOTE — Assessment & Plan Note (Signed)
Patient was advised during last visit take ibuprofen after meals as needed for pain- which he hasn't tried yet. Has no new changes in pain including no fever, nausea vomiting or, signs of urinary stones. Discussed with him about trying ibuprofen again along with heat pad. He will try this and see if it helps. Will not do imaging at this point of time yet.

## 2012-12-31 NOTE — Assessment & Plan Note (Signed)
Lab Results  Component Value Date   HGBA1C 6.5 12/31/2012   HGBA1C 8.9 10/22/2010   CREATININE 0.93 11/23/2011   CREATININE 1.0 10/22/2010   CHOL 274* 11/23/2011   HDL 30* 11/23/2011   TRIG 478* 11/23/2011    Last eye exam and foot exam: No results found for this basename: HMDIABEYEEXA, HMDIABFOOTEX    Assessment: Diabetes control: controlled Progress toward goals: at goal Barriers to meeting goals: no barriers identified  Plan: Diabetes treatment: continue current medications. Continue metformin 1000 mg twice daily. Asked patient to get help from his wife reminded him to take the evening pill which he is forgetting lately. We'll consider changing him to extended release during next visit if he still continues to have this problem. Refer to: none Instruction/counseling given: reminded to bring blood glucose meter & log to each visit, reminded to bring medications to each visit, discussed foot care and discussed diet

## 2012-12-31 NOTE — Assessment & Plan Note (Signed)
Lab Results  Component Value Date   NA 136 11/23/2011   K 4.6 11/23/2011   CL 101 11/23/2011   CO2 25 11/23/2011   BUN 11 11/23/2011   CREATININE 0.93 11/23/2011   CREATININE 1.0 10/22/2010    BP Readings from Last 3 Encounters:  12/31/12 136/86  09/17/12 131/81  06/11/12 138/85    Assessment: Hypertension control:  controlled  Progress toward goals:  at goal Barriers to meeting goals:  no barriers identified  Plan: Hypertension treatment:  continue current medications continue lisinopril 20 mg daily.

## 2012-12-31 NOTE — Patient Instructions (Addendum)
Please make a follow up appointment in 3-4 months. Take all meds regularly. Will check your Cholesterol today. Try Advil 2 pills before bed with water- if you have the back pain.

## 2012-12-31 NOTE — Progress Notes (Signed)
  Subjective:    Patient ID: Carlos Bowman, male    DOB: 11/14/1951, 62 y.o.   MRN: [id number]  HPI patient is a pleasant 62 year old man with history of well-controlled DM 2, hyperlipidemia, hypertension who comes to the clinic for diabetes and other medical problems followup.  His A1c today is 6.5 which is stable. His blood pressure today is 136/86. He denies any fever, nausea, vomiting abdominal pain, chest pain, short of breath, diarrhea.  She does report forgetting taking evening dose of metformin. 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 He denies forgetting phone numbers, paying bills, names, addresses. Does not have any other signs of dementia.  History of complaints of right-sided upper back pain- intermittent, mostly at night when he sleeps. 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 Tylenol does not help. Hasn't tried ibuprofen or heat yet.    Review of Systems    As per history of present illness.  Objective:   Physical Exam  General: NAD HEENT: PERRL, EOMI, no scleral icterus Cardiac: S1, S2, RRR, no rubs, murmurs or gallops Pulm: clear to auscultation bilaterally, moving normal volumes of air Abd: soft, nontender, nondistended, BS present Ext: warm and well perfused, no pedal edema Neuro: alert and oriented X3, cranial nerves II-XII grossly intact       Assessment & Plan:

## 2012-12-31 NOTE — Assessment & Plan Note (Signed)
Repeat lipid panel today. Patient not taking Pravachol for past few months as the refill was sent to [facility] which was not cost effective for him. I gave him printed prescription today which he will take to get [facility] pharmacy and get it filled today. Advised him to call the clinic if something like this happens again.

## 2013-04-22 ENCOUNTER — Ambulatory Visit (INDEPENDENT_AMBULATORY_CARE_PROVIDER_SITE_OTHER): Payer: No Typology Code available for payment source | Admitting: Internal Medicine

## 2013-04-22 ENCOUNTER — Encounter: Payer: Self-pay | Admitting: Internal Medicine

## 2013-04-22 VITALS — BP 126/83 | HR 82 | Temp 97.6°F | Ht 71.0 in | Wt 237.5 lb

## 2013-04-22 DIAGNOSIS — J309 Allergic rhinitis, unspecified: Secondary | ICD-10-CM

## 2013-04-22 DIAGNOSIS — E785 Hyperlipidemia, unspecified: Secondary | ICD-10-CM

## 2013-04-22 DIAGNOSIS — E119 Type 2 diabetes mellitus without complications: Secondary | ICD-10-CM

## 2013-04-22 DIAGNOSIS — I10 Essential (primary) hypertension: Secondary | ICD-10-CM

## 2013-04-22 LAB — GLUCOSE, CAPILLARY: Glucose-Capillary: 91 mg/dL (ref 70–99)

## 2013-04-22 MED ORDER — FEXOFENADINE-PSEUDOEPHED ER 60-120 MG PO TB12: 1.0000 | ORAL_TABLET | Freq: Two times a day (BID) | ORAL | Status: DC

## 2013-04-22 NOTE — Assessment & Plan Note (Signed)
Continue Pravachol 40 mg daily. Last LDL was 165 in January 2014.  Repeat lipid panel due in January 2015.

## 2013-04-22 NOTE — Assessment & Plan Note (Signed)
Prescription for Allegra-D given.

## 2013-04-22 NOTE — Patient Instructions (Signed)
Please make a followup appointment in 6-8 months.  Meanwhile continue taking metformin, Pravachol, aspirin, lisinopril regularly as you do.  Start taking Allegra-D for allergies until the allergy season is over.  Call clinic for further questions or concerns or early appointment if needed.

## 2013-04-22 NOTE — Assessment & Plan Note (Signed)
BP Readings from Last 3 Encounters:  04/22/13 126/83  12/31/12 136/86  09/17/12 131/81    Lab Results  Component Value Date   NA 136 11/23/2011   K 4.6 11/23/2011   CREATININE 0.93 11/23/2011    Assessment: Blood pressure control:   at goal Progress toward BP goal:    improved Comments:   Plan: Medications:  continue current medications, Lisinopril 20 daily Educational resources provided: brochure;handout;video Self management tools provided:   Other plans: No changes in medications.

## 2013-04-22 NOTE — Assessment & Plan Note (Signed)
Lab Results  Component Value Date   HGBA1C 6.0 04/22/2013   HGBA1C 6.5 12/31/2012   HGBA1C 6.5 09/17/2012     Assessment: Diabetes control:   excellent Progress toward A1C goal:    improved Comments:   Plan: Medications:  continue current medications, metformin 1000 mg twice daily Home glucose monitoring: Frequency:   Timing:   Instruction/counseling given: reminded to bring medications to each visit and discussed diet Educational resources provided: brochure;handout Self management tools provided: copy of home glucose meter download Other plans: Please in the office visits and A1c check to every 6-8 months considering stable hemoglobin A1c levels for past 3-4 visits.

## 2013-04-22 NOTE — Progress Notes (Signed)
  Subjective:    Patient ID: Carlos Bowman, male    DOB: 1951/12/06, 62 y.o.   MRN: [id number]  HPI patient is a pleasant 62 year man with uncontrolled DM 2, hyperlipidemia, hypertension and other problems as per problem list who comes the clinic for regular followup visit.  His A1c today 6.0. Blood pressure 126/83. Last LDL cholesterol was 165 in January 2014.  He does report having some allergy symptoms lately if he goes out of house including itching of eyes, sinus congestion, throat congestion and some shortness of breath. He went to pharmacy to get Allegra, but they somehow refused to give him medication.  He denies any fever, chills, nausea, vomiting, abdominal pain, diarrhea, headache, palpitations.    Review of Systems    as per history of present illness. Objective:   Physical Exam  General: NAD HEENT: PERRL, EOMI, no scleral icterus Cardiac: S1, S2, RRR, no rubs, murmurs or gallops Pulm: clear to auscultation bilaterally, moving normal volumes of air Abd: soft, nontender, nondistended, BS present Ext: warm and well perfused, no pedal edema Neuro: alert and oriented X3, cranial nerves II-XII grossly intact       Assessment & Plan:

## 2013-05-20 ENCOUNTER — Encounter: Payer: Self-pay | Admitting: Dietician

## 2013-06-18 ENCOUNTER — Other Ambulatory Visit: Payer: Self-pay

## 2013-06-26 ENCOUNTER — Ambulatory Visit: Payer: No Typology Code available for payment source

## 2013-07-02 ENCOUNTER — Encounter: Payer: Self-pay | Admitting: Internal Medicine

## 2013-08-24 ENCOUNTER — Encounter: Payer: Self-pay | Admitting: Internal Medicine

## 2013-08-24 ENCOUNTER — Other Ambulatory Visit: Payer: Self-pay | Admitting: Internal Medicine

## 2013-08-24 DIAGNOSIS — I10 Essential (primary) hypertension: Secondary | ICD-10-CM

## 2013-08-24 DIAGNOSIS — E785 Hyperlipidemia, unspecified: Secondary | ICD-10-CM

## 2013-08-24 DIAGNOSIS — E119 Type 2 diabetes mellitus without complications: Secondary | ICD-10-CM

## 2013-08-24 MED ORDER — ROSUVASTATIN CALCIUM 10 MG PO TABS: 10.0000 mg | ORAL_TABLET | Freq: Every day | ORAL | Status: DC

## 2013-08-24 MED ORDER — DESLORATADINE 5 MG PO TABS: 5.0000 mg | ORAL_TABLET | Freq: Every day | ORAL | Status: DC

## 2013-11-27 ENCOUNTER — Other Ambulatory Visit: Payer: Self-pay | Admitting: *Deleted

## 2013-11-27 DIAGNOSIS — I10 Essential (primary) hypertension: Secondary | ICD-10-CM

## 2013-11-30 MED ORDER — LISINOPRIL 20 MG PO TABS: 20.0000 mg | ORAL_TABLET | Freq: Every day | ORAL | Status: DC

## 2013-11-30 MED ORDER — METFORMIN HCL 1000 MG PO TABS: 1000.0000 mg | ORAL_TABLET | Freq: Two times a day (BID) | ORAL | Status: DC

## 2013-12-31 ENCOUNTER — Ambulatory Visit (INDEPENDENT_AMBULATORY_CARE_PROVIDER_SITE_OTHER): Payer: Medicare HMO | Admitting: Internal Medicine

## 2013-12-31 ENCOUNTER — Encounter: Payer: Self-pay | Admitting: Internal Medicine

## 2013-12-31 VITALS — BP 136/89 | HR 80 | Temp 97.3°F | Ht 71.0 in | Wt 237.0 lb

## 2013-12-31 DIAGNOSIS — E119 Type 2 diabetes mellitus without complications: Secondary | ICD-10-CM

## 2013-12-31 DIAGNOSIS — E785 Hyperlipidemia, unspecified: Secondary | ICD-10-CM

## 2013-12-31 DIAGNOSIS — Z720 Tobacco use: Secondary | ICD-10-CM

## 2013-12-31 DIAGNOSIS — F17200 Nicotine dependence, unspecified, uncomplicated: Secondary | ICD-10-CM

## 2013-12-31 DIAGNOSIS — J309 Allergic rhinitis, unspecified: Secondary | ICD-10-CM

## 2013-12-31 DIAGNOSIS — I10 Essential (primary) hypertension: Secondary | ICD-10-CM

## 2013-12-31 DIAGNOSIS — Z0000 Encounter for general adult medical examination without abnormal findings: Secondary | ICD-10-CM | POA: Insufficient documentation

## 2013-12-31 LAB — BASIC METABOLIC PANEL WITH GFR
BUN: 13 mg/dL (ref 6–23)
CALCIUM: 10.1 mg/dL (ref 8.4–10.5)
CO2: 25 meq/L (ref 19–32)
CREATININE: 0.89 mg/dL (ref 0.50–1.35)
Chloride: 102 mEq/L (ref 96–112)
GFR, Est African American: >89 mL/min
Glucose, Bld: 89 mg/dL (ref 70–99)
Potassium: 4.8 mEq/L (ref 3.5–5.3)
SODIUM: 140 meq/L (ref 135–145)

## 2013-12-31 LAB — LIPID PANEL
CHOL/HDL RATIO: 4.6 ratio
Cholesterol: 172 mg/dL (ref 0–200)
HDL: 37 mg/dL — AB (ref >39)
LDL CALC: 74 mg/dL (ref 0–99)
TRIGLYCERIDES: 304 mg/dL — AB (ref <150)
VLDL: 61 mg/dL — AB (ref 0–40)

## 2013-12-31 LAB — GLUCOSE, CAPILLARY: Glucose-Capillary: 81 mg/dL (ref 70–99)

## 2013-12-31 LAB — HM DIABETES EYE EXAM

## 2013-12-31 LAB — POCT GLYCOSYLATED HEMOGLOBIN (HGB A1C): HEMOGLOBIN A1C: 6.1

## 2013-12-31 MED ORDER — LISINOPRIL 20 MG PO TABS: 20.0000 mg | ORAL_TABLET | Freq: Every day | ORAL | Status: DC

## 2013-12-31 MED ORDER — DESLORATADINE 5 MG PO TABS: 5.0000 mg | ORAL_TABLET | Freq: Every day | ORAL | Status: DC

## 2013-12-31 NOTE — Assessment & Plan Note (Addendum)
BP Readings from Last 3 Encounters:  12/31/13 136/89  04/22/13 126/83  12/31/12 136/86    Lab Results  Component Value Date   NA 136 11/23/2011   K 4.6 11/23/2011   CREATININE 0.93 11/23/2011    Assessment: Blood pressure control: controlled Progress toward BP goal:  at goal Comments: n/a   Plan: Medications:  continue current medications (Lisinopril 20) Educational resources provided: brochure;handout Self management tools provided: home blood pressure logbook Other plans: Rx refill Lisinopril, BMET today

## 2013-12-31 NOTE — Assessment & Plan Note (Addendum)
Lab Results  Component Value Date   HGBA1C 6.1 12/31/2013   HGBA1C 6.0 04/22/2013   HGBA1C 6.5 12/31/2012     Assessment: Diabetes control: good control (HgbA1C at goal) Progress toward A1C goal:  at goal Comments: hypoglycemic episodes since last visit  Plan: Medications:  discontinued Metformin 1000 mg bid  Home glucose monitoring: Frequency: once a day especially if feeling hypoglycemic  Timing: N/A  Instruction/counseling given: reminded to get eye exam and provided printed educational material Educational resources provided: brochure;handout Self management tools provided: copy of home glucose meter download Other plans: f/u in 3 months, check microAlb/Cr today, retinal pro1200 exam today, DM foot exam today

## 2013-12-31 NOTE — Progress Notes (Signed)
   Subjective:    Patient ID: Carlos Bowman, male    DOB: [birth year], 63 y.o.   MRN: [id number]  HPI Comments: 63 y.o Diabetes mellitus (6.1 cbg 110),  Hyperlipidemia  (LDL 165), GERD (gastroesophageal reflux disease), HTN (BP 142/89 with repeat 136/89), allergic rhinitis, osteoarthritis, tobacco abuse (smokes 1/2 ppd smoking since age 62 smoking less)  1.  He presents for DM follow up.  He has been having lows of meter from 37 to 50s with sxs such as shaking and sweating.  When he had the low reading of 37 it read error and not enough blood was on the glucose strip.  Highest fsbs is 118.  He states his appetite has been decreased.  He eats 2 meals per day but does not eat a full course meal.  He states he ate more when he was working.  He is still taking Metformin 1000 mg bid.  He reports last colonoscopy was with Dr. Delfina Redwood.    2. He states he recently (last week) had sinus problems including nasal congestion, stuffiness in head thick yellow nasal drainage, running nose, cough with clear phlegm which improved after taking OTC Alkaseltzer cold plus         3. He needs Rx refill of Lisinopril 20, Clarinex 5 mg daily.  He goes to [place] for these medications.    4. HM-he needs to speak to [facility] to get a retinal exam today.  He declines flu vaccine   SH: retired Administrator x 3 years, married x 43 years.    No kids but has step kids.  He works at the [employer] in spare time.       Review of Systems  Respiratory: Negative for shortness of breath.   Cardiovascular: Negative for chest pain and leg swelling.  Gastrointestinal: Negative for abdominal pain.       Objective:   Physical Exam  Nursing note and vitals reviewed. Constitutional: He is oriented to person, place, and time. Vital signs are normal. He appears well-developed and well-nourished. He is cooperative.  HENT:  Head: Normocephalic and atraumatic.  Mouth/Throat: Oropharynx is clear and moist and  mucous membranes are normal. Abnormal dentition. No oropharyngeal exudate.  Eyes: Conjunctivae are normal. Pupils are equal, round, and reactive to light. Right eye exhibits no discharge. Left eye exhibits no discharge. No scleral icterus.  Cardiovascular: Normal rate, regular rhythm, S1 normal, S2 normal and normal heart sounds.   No murmur heard. Pulses:      Dorsalis pedis pulses are 2+ on the right side, and 2+ on the left side.       Posterior tibial pulses are 2+ on the right side, and 2+ on the left side.  No lower ext edema  Pulmonary/Chest: Effort normal and breath sounds normal.  Abdominal: Soft. Bowel sounds are normal. He exhibits no distension. There is no tenderness.  Musculoskeletal: He exhibits no edema.  Neurological: He is alert and oriented to person, place, and time. Gait normal.  Skin: Skin is warm, dry and intact. No rash noted.  Psychiatric: He has a normal mood and affect. His speech is normal and behavior is normal. Judgment and thought content normal. Cognition and memory are normal.          Assessment & Plan:  F/u in 3 months

## 2013-12-31 NOTE — Assessment & Plan Note (Addendum)
Declined flu shot today.  Retinal exam today, will check lipid panel, microAlb/Creatinine, DM foot exam today  Will try to get records from ?Dr. Delfina Redwood (GI) for previous colonoscopy

## 2013-12-31 NOTE — Assessment & Plan Note (Signed)
Rx refill Clarinex 5 mg today

## 2013-12-31 NOTE — Assessment & Plan Note (Signed)
  Assessment: Progress toward smoking cessation:  smoking the same amount Barriers to progress toward smoking cessation:   (duration ) Comments: none  Plan: Instruction/counseling given:  I counseled patient on the dangers of tobacco use, advised patient to stop smoking, and reviewed strategies to maximize success. Educational resources provided:  [employer] account manager) brochure Self management tools provided: handout  Medications to assist with smoking cessation:  None Patient agreed to the following self-care plans for smoking cessation: call [employer] (1-800-QUIT-NOW)  Other plans: counseling

## 2013-12-31 NOTE — Assessment & Plan Note (Signed)
Lipid Panel     Component Value Date/Time   CHOL 262* 12/31/2012 1516   TRIG 311* 12/31/2012 1516   HDL 35* 12/31/2012 1516   CHOLHDL 7.5 12/31/2012 1516   VLDL 62* 12/31/2012 1516   LDLCALC 165* 12/31/2012 1516   Will repeat lipid panel today  Cont. Statin

## 2013-12-31 NOTE — Patient Instructions (Addendum)
General Instructions: Read all the information below especially about low blood sugar.  Check you blood glucose if you are having symptoms and eat candy or drink juice and check glucose again  Follow up in 3 months   Try to stop smoking  Stop taking Metformin for now     Treatment Goals:  Goals (1 Years of Data) as of 12/31/13         As of Today As of Today 04/22/13 12/31/12 09/17/12     Blood Pressure    . Blood Pressure < 140/90  136/89 142/89 126/83 136/86 131/81     Result Component    . HEMOGLOBIN A1C < 7.0  6.1  6.0 6.5 6.5    . LDL CALC < 100     165       Progress Toward Treatment Goals:  Treatment Goal 12/31/2013  Hemoglobin A1C at goal  Blood pressure at goal  Stop smoking smoking the same amount  Prevent falls improved    Self Care Goals & Plans:  Self Care Goal 12/31/2013  Manage my medications take my medicines as prescribed; bring my medications to every visit; refill my medications on time; follow the sick day instructions if I am sick  Monitor my health keep track of my blood glucose; bring my glucose meter and log to each visit  Eat healthy foods drink diet soda or water instead of juice or soda; eat more vegetables; eat foods that are low in salt; eat baked foods instead of fried foods; eat fruit for snacks and desserts; eat smaller portions  Be physically active find an activity I enjoy  Stop smoking call [facility] (1-800-QUIT-NOW)  Meeting treatment goals maintain the current self-care plan    Home Blood Glucose Monitoring 12/31/2013  Check my blood sugar once a day  When to check my blood sugar N/A     Care Management & Community Referrals:  Referral 12/31/2013  Referrals made for care management support none needed  Referrals made to community resources none       Cholesterol Cholesterol is a white, waxy, fat-like protein needed by your body in small amounts. The liver makes all the cholesterol you need. It is carried from the liver by the  blood through the blood vessels. Deposits (plaque) may build up on blood vessel walls. This makes the arteries narrower and stiffer. Plaque increases the risk for heart attack and stroke. You cannot feel your cholesterol level even if it is very high. The only way to know is by a blood test to check your lipid (fats) levels. Once you know your cholesterol levels, you should keep a record of the test results. Work with your caregiver to to keep your levels in the desired range. WHAT THE RESULTS MEAN:  Total cholesterol is a rough measure of all the cholesterol in your blood.  LDL is the so-called bad cholesterol. This is the type that deposits cholesterol in the walls of the arteries. You want this level to be low.  HDL is the good cholesterol because it cleans the arteries and carries the LDL away. You want this level to be high.  Triglycerides are fat that the body can either burn for energy or store. High levels are closely linked to heart disease. DESIRED LEVELS:  Total cholesterol below 200.  LDL below 100 for people at risk, below 70 for very high risk.  HDL above 50 is good, above 60 is best.  Triglycerides below 150. HOW TO LOWER YOUR  CHOLESTEROL:  Diet.  Choose fish or white meat chicken and [place], roasted or baked. Limit fatty cuts of red meat, fried foods, and processed meats, such as sausage and lunch meat.  Eat lots of fresh fruits and vegetables. Choose whole grains, beans, pasta, potatoes and cereals.  Use only small amounts of olive, corn or canola oils. Avoid butter, mayonnaise, shortening or palm kernel oils. Avoid foods with trans-fats.  Use skim/nonfat milk and low-fat/nonfat yogurt and cheeses. Avoid whole milk, cream, ice cream, egg yolks and cheeses. Healthy desserts include angel food cake, ginger snaps, animal crackers, hard candy, popsicles, and low-fat/nonfat frozen yogurt. Avoid pastries, cakes, pies and cookies.  Exercise.  A regular program helps  decrease LDL and raises HDL.  Helps with weight control.  Do things that increase your activity level like gardening, walking, or taking the stairs.  Medication.  May be prescribed by your caregiver to help lowering cholesterol and the risk for heart disease.  You may need medicine even if your levels are normal if you have several risk factors. HOME CARE INSTRUCTIONS   Follow your diet and exercise programs as suggested by your caregiver.  Take medications as directed.  Have blood work done when your caregiver feels it is necessary. MAKE SURE YOU:   Understand these instructions.  Will watch your condition.  Will get help right away if you are not doing well or get worse. Document Released: 08/21/2001 Document Revised: 02/18/2012 Document Reviewed: 09/09/2013 [facility] Patient Information 2014 [place], [place].  Hypoglycemia (Low Blood Sugar) Hypoglycemia is when the glucose (sugar) in your blood is too low. Hypoglycemia can happen for many reasons. It can happen to people with or without diabetes. Hypoglycemia can develop quickly and can be a medical emergency.  CAUSES  Having hypoglycemia does not mean that you will develop diabetes. Different causes include:  Missed or delayed meals or not enough carbohydrates eaten.  Medication overdose. This could be by accident or deliberate. If by accident, your medication may need to be adjusted or changed.  Exercise or increased activity without adjustments in carbohydrates or medications.  A nerve disorder that affects body functions like your heart rate, blood pressure and digestion (autonomic neuropathy).  A condition where the stomach muscles do not function properly (gastroparesis). Therefore, medications may not absorb properly.  The inability to recognize the signs of hypoglycemia (hypoglycemic unawareness).  Absorption of insulin  may be altered.  Alcohol consumption.  Pregnancy/menstrual cycles/postpartum. This may be  due to hormones.  Certain kinds of tumors. This is very rare. SYMPTOMS   Sweating.  Hunger.  Dizziness.  Blurred vision.  Drowsiness.  Weakness.  Headache.  Rapid heart beat.  Shakiness.  Nervousness. DIAGNOSIS  Diagnosis is made by monitoring blood glucose in one or all of the following ways:  Fingerstick blood glucose monitoring.  Laboratory results. TREATMENT  If you think your blood glucose is low:  Check your blood glucose, if possible. If it is less than 70 mg/dl, take one of the following:  3-4 glucose tablets.   cup juice (prefer clear like apple).   cup "regular" soda pop.  1 cup milk.  -1 tube of glucose gel.  5-6 hard candies.  Do not over treat because your blood glucose (sugar) will only go too high.  Wait 15 minutes and recheck your blood glucose. If it is still less than 70 mg/dl (or below your target range), repeat treatment.  Eat a snack if it is more than one hour until your next  meal. Sometimes, your blood glucose may go so low that you are unable to treat yourself. You may need someone to help you. You may even pass out or be unable to swallow. This may require you to get an injection of glucagon, which raises the blood glucose. HOME CARE INSTRUCTIONS  Check blood glucose as recommended by your caregiver.  Take medication as prescribed by your caregiver.  Follow your meal plan. Do not skip meals. Eat on time.  If you are going to drink alcohol, drink it only with meals.  Check your blood glucose before driving.  Check your blood glucose before and after exercise. If you exercise longer or different than usual, be sure to check blood glucose more frequently.  Always carry treatment with you. Glucose tablets are the easiest to carry.  Always wear medical alert jewelry or carry some form of identification that states that you have diabetes. This will alert people that you have diabetes. If you have hypoglycemia, they will have a  better idea on what to do. SEEK MEDICAL CARE IF:   You are having problems keeping your blood sugar at target range.  You are having frequent episodes of hypoglycemia.  You feel you might be having side effects from your medicines.  You have symptoms of an illness that is not improving after 3-4 days.  You notice a change in vision or a new problem with your vision. SEEK IMMEDIATE MEDICAL CARE IF:   You are a family member or friend of a person whose blood glucose goes below 70 mg/dl and is accompanied by:  Confusion.  A change in mental status.  The inability to swallow.  Passing out. Document Released: 11/26/2005 Document Revised: 02/18/2012 Document Reviewed: 03/24/2012 [facility] Patient Information 2014 [place], [place].  Smoking Cessation Quitting smoking is important to your health and has many advantages. However, it is not always easy to quit since nicotine is a very addictive drug. Often times, people try 3 times or more before being able to quit. This document explains the best ways for you to prepare to quit smoking. Quitting takes hard work and a lot of effort, but you can do it. ADVANTAGES OF QUITTING SMOKING  You will live longer, feel better, and live better.  Your body will feel the impact of quitting smoking almost immediately.  Within 20 minutes, blood pressure decreases. Your pulse returns to its normal level.  After 8 hours, carbon monoxide levels in the blood return to normal. Your oxygen level increases.  After 24 hours, the chance of having a heart attack starts to decrease. Your breath, hair, and body stop smelling like smoke.  After 48 hours, damaged nerve endings begin to recover. Your sense of taste and smell improve.  After 72 hours, the body is virtually free of nicotine. Your bronchial tubes relax and breathing becomes easier.  After 2 to 12 weeks, lungs can hold more air. Exercise becomes easier and circulation improves.  The risk of having a  heart attack, stroke, cancer, or lung disease is greatly reduced.  After 1 year, the risk of coronary heart disease is cut in half.  After 5 years, the risk of stroke falls to the same as a nonsmoker.  After 10 years, the risk of lung cancer is cut in half and the risk of other cancers decreases significantly.  After 15 years, the risk of coronary heart disease drops, usually to the level of a nonsmoker.  If you are pregnant, quitting smoking will improve your  chances of having a healthy baby.  The people you live with, especially any children, will be healthier.  You will have extra money to spend on things other than cigarettes. QUESTIONS TO THINK ABOUT BEFORE ATTEMPTING TO QUIT You may want to talk about your answers with your caregiver.  Why do you want to quit?  If you tried to quit in the past, what helped and what did not?  What will be the most difficult situations for you after you quit? How will you plan to handle them?  Who can help you through the tough times? Your family? Friends? A caregiver?  What pleasures do you get from smoking? What ways can you still get pleasure if you quit? Here are some questions to ask your caregiver:  How can you help me to be successful at quitting?  What medicine do you think would be best for me and how should I take it?  What should I do if I need more help?  What is smoking withdrawal like? How can I get information on withdrawal? GET READY  Set a quit date.  Change your environment by getting rid of all cigarettes, ashtrays, matches, and lighters in your home, car, or work. Do not let people smoke in your home.  Review your past attempts to quit. Think about what worked and what did not. GET SUPPORT AND ENCOURAGEMENT You have a better chance of being successful if you have help. You can get support in many ways.  Tell your family, friends, and co-workers that you are going to quit and need their support. Ask them not to  smoke around you.  Get individual, group, or telephone counseling and support. Programs are available at [employer] and health centers. Call your local health department for information about programs in your area.  Spiritual beliefs and practices may help some smokers quit.  Download a "quit meter" on your computer to keep track of quit statistics, such as how long you have gone without smoking, cigarettes not smoked, and money saved.  Get a self-help book about quitting smoking and staying off of tobacco. Palmer yourself from urges to smoke. Talk to someone, go for a walk, or occupy your time with a task.  Change your normal routine. Take a different route to work. Drink tea instead of coffee. Eat breakfast in a different place.  Reduce your stress. Take a hot bath, exercise, or read a book.  Plan something enjoyable to do every day. Reward yourself for not smoking.  Explore interactive web-based programs that specialize in helping you quit. GET MEDICINE AND USE IT CORRECTLY Medicines can help you stop smoking and decrease the urge to smoke. Combining medicine with the above behavioral methods and support can greatly increase your chances of successfully quitting smoking.  Nicotine replacement therapy helps deliver nicotine to your body without the negative effects and risks of smoking. Nicotine replacement therapy includes nicotine gum, lozenges, inhalers, nasal sprays, and skin patches. Some may be available over-the-counter and others require a prescription.  Antidepressant medicine helps people abstain from smoking, but how this works is unknown. This medicine is available by prescription.  Nicotinic receptor partial agonist medicine simulates the effect of nicotine in your brain. This medicine is available by prescription. Ask your caregiver for advice about which medicines to use and how to use them based on your health history. Your  caregiver will tell you what side effects to look out for if you  choose to be on a medicine or therapy. Carefully read the information on the package. Do not use any other product containing nicotine while using a nicotine replacement product.  RELAPSE OR DIFFICULT SITUATIONS Most relapses occur within the first 3 months after quitting. Do not be discouraged if you start smoking again. Remember, most people try several times before finally quitting. You may have symptoms of withdrawal because your body is used to nicotine. You may crave cigarettes, be irritable, feel very hungry, cough often, get headaches, or have difficulty concentrating. The withdrawal symptoms are only temporary. They are strongest when you first quit, but they will go away within 10 14 days. To reduce the chances of relapse, try to:  Avoid drinking alcohol. Drinking lowers your chances of successfully quitting.  Reduce the amount of caffeine you consume. Once you quit smoking, the amount of caffeine in your body increases and can give you symptoms, such as a rapid heartbeat, sweating, and anxiety.  Avoid smokers because they can make you want to smoke.  Do not let weight gain distract you. Many smokers will gain weight when they quit, usually less than 10 pounds. Eat a healthy diet and stay active. You can always lose the weight gained after you quit.  Find ways to improve your mood other than smoking. FOR MORE INFORMATION  www.smokefree.gov  Document Released: 11/20/2001 Document Revised: 05/27/2012 Document Reviewed: 03/06/2012 [facility] Patient Information 2014 [place], [place].  DASH Diet The DASH diet stands for "Dietary Approaches to Stop Hypertension." It is a healthy eating plan that has been shown to reduce high blood pressure (hypertension) in as little as 14 days, while also possibly providing other significant health benefits. These other health benefits include reducing the risk of breast cancer after menopause  and reducing the risk of type 2 diabetes, heart disease, colon cancer, and stroke. Health benefits also include weight loss and slowing kidney failure in patients with chronic kidney disease.  DIET GUIDELINES  Limit salt (sodium). Your diet should contain less than 1500 mg of sodium daily.  Limit refined or processed carbohydrates. Your diet should include mostly whole grains. Desserts and added sugars should be used sparingly.  Include small amounts of heart-healthy fats. These types of fats include nuts, oils, and tub margarine. Limit saturated and trans fats. These fats have been shown to be harmful in the body. CHOOSING FOODS  The following food groups are based on a 2000 calorie diet. See your Registered Dietitian for individual calorie needs. Grains and Grain Products (6 to 8 servings daily)  Eat More Often: Whole-wheat bread, brown rice, whole-grain or wheat pasta, quinoa, popcorn without added fat or salt (air popped).  Eat Less Often: White bread, white pasta, white rice, cornbread. Vegetables (4 to 5 servings daily)  Eat More Often: Fresh, frozen, and canned vegetables. Vegetables may be raw, steamed, roasted, or grilled with a minimal amount of fat.  Eat Less Often/Avoid: Creamed or fried vegetables. Vegetables in a cheese sauce. Fruit (4 to 5 servings daily)  Eat More Often: All fresh, canned (in natural juice), or frozen fruits. Dried fruits without added sugar. One hundred percent fruit juice ( cup [237 mL] daily).  Eat Less Often: Dried fruits with added sugar. Canned fruit in light or heavy syrup. [employer], Fish, and Poultry (2 servings or less daily. One serving is 3 to 4 oz [85-114 g]).  Eat More Often: Ninety percent or leaner ground beef, tenderloin, sirloin. Round cuts of beef, chicken breast, [place] breast. All  fish. Grill, bake, or broil your meat. Nothing should be fried.  Eat Less Often/Avoid: Fatty cuts of meat, [place], or chicken leg, thigh, or wing. Fried  cuts of meat or fish. Dairy (2 to 3 servings)  Eat More Often: Low-fat or fat-free milk, low-fat plain or light yogurt, reduced-fat or part-skim cheese.  Eat Less Often/Avoid: Milk (whole, 2%).Whole milk yogurt. Full-fat cheeses. Nuts, Seeds, and Legumes (4 to 5 servings per week)  Eat More Often: All without added salt.  Eat Less Often/Avoid: Salted nuts and seeds, canned beans with added salt. Fats and Sweets (limited)  Eat More Often: Vegetable oils, tub margarines without trans fats, sugar-free gelatin. Mayonnaise and salad dressings.  Eat Less Often/Avoid: Coconut oils, palm oils, butter, stick margarine, cream, half and half, cookies, candy, pie. FOR MORE INFORMATION The Dash Diet Eating Plan: www.dashdiet.org Document Released: 11/15/2011 Document Revised: 02/18/2012 Document Reviewed: 11/15/2011 [facility] Patient Information 2014 [place], [place].  Hypertension As your heart beats, it forces blood through your arteries. This force is your blood pressure. If the pressure is too high, it is called hypertension (HTN) or high blood pressure. HTN is dangerous because you may have it and not know it. High blood pressure may mean that your heart has to work harder to pump blood. Your arteries may be narrow or stiff. The extra work puts you at risk for heart disease, stroke, and other problems.  Blood pressure consists of two numbers, a higher number over a lower, 110/72, for example. It is stated as "110 over 72." The ideal is below 120 for the top number (systolic) and under 80 for the bottom (diastolic). Write down your blood pressure today. You should pay close attention to your blood pressure if you have certain conditions such as:  Heart failure.  Prior heart attack.  Diabetes  Chronic kidney disease.  Prior stroke.  Multiple risk factors for heart disease. To see if you have HTN, your blood pressure should be measured while you are seated with your arm held at the level of  the heart. It should be measured at least twice. A one-time elevated blood pressure reading (especially in the Emergency Department) does not mean that you need treatment. There may be conditions in which the blood pressure is different between your right and left arms. It is important to see your caregiver soon for a recheck. Most people have essential hypertension which means that there is not a specific cause. This type of high blood pressure may be lowered by changing lifestyle factors such as:  Stress.  Smoking.  Lack of exercise.  Excessive weight.  Drug/tobacco/alcohol use.  Eating less salt. Most people do not have symptoms from high blood pressure until it has caused damage to the body. Effective treatment can often prevent, delay or reduce that damage. TREATMENT  When a cause has been identified, treatment for high blood pressure is directed at the cause. There are a large number of medications to treat HTN. These fall into several categories, and your caregiver will help you select the medicines that are best for you. Medications may have side effects. You should review side effects with your caregiver. If your blood pressure stays high after you have made lifestyle changes or started on medicines,   Your medication(s) may need to be changed.  Other problems may need to be addressed.  Be certain you understand your prescriptions, and know how and when to take your medicine.  Be sure to follow up with your caregiver within  the time frame advised (usually within two weeks) to have your blood pressure rechecked and to review your medications.  If you are taking more than one medicine to lower your blood pressure, make sure you know how and at what times they should be taken. Taking two medicines at the same time can result in blood pressure that is too low. SEEK IMMEDIATE MEDICAL CARE IF:  You develop a severe headache, blurred or changing vision, or confusion.  You have  unusual weakness or numbness, or a faint feeling.  You have severe chest or abdominal pain, vomiting, or breathing problems. MAKE SURE YOU:   Understand these instructions.  Will watch your condition.  Will get help right away if you are not doing well or get worse. Document Released: 11/26/2005 Document Revised: 02/18/2012 Document Reviewed: 07/16/2008 [facility] Patient Information 2014 [place].  Sinusitis Sinusitis is redness, soreness, and swelling (inflammation) of the paranasal sinuses. Paranasal sinuses are air pockets within the bones of your face (beneath the eyes, the middle of the forehead, or above the eyes). In healthy paranasal sinuses, mucus is able to drain out, and air is able to circulate through them by way of your nose. However, when your paranasal sinuses are inflamed, mucus and air can become trapped. This can allow bacteria and other germs to grow and cause infection. Sinusitis can develop quickly and last only a short time (acute) or continue over a long period (chronic). Sinusitis that lasts for more than 12 weeks is considered chronic.  CAUSES  Causes of sinusitis include:  Allergies.  Structural abnormalities, such as displacement of the cartilage that separates your nostrils (deviated septum), which can decrease the air flow through your nose and sinuses and affect sinus drainage.  Functional abnormalities, such as when the small hairs (cilia) that line your sinuses and help remove mucus do not work properly or are not present. SYMPTOMS  Symptoms of acute and chronic sinusitis are the same. The primary symptoms are pain and pressure around the affected sinuses. Other symptoms include:  Upper toothache.  Earache.  Headache.  Bad breath.  Decreased sense of smell and taste.  A cough, which worsens when you are lying flat.  Fatigue.  Fever.  Thick drainage from your nose, which often is green and may contain pus (purulent).  Swelling and  warmth over the affected sinuses. DIAGNOSIS  Your caregiver will perform a physical exam. During the exam, your caregiver may:  Look in your nose for signs of abnormal growths in your nostrils (nasal polyps).  Tap over the affected sinus to check for signs of infection.  View the inside of your sinuses (endoscopy) with a special imaging device with a light attached (endoscope), which is inserted into your sinuses. If your caregiver suspects that you have chronic sinusitis, one or more of the following tests may be recommended:  Allergy tests.  Nasal culture A sample of mucus is taken from your nose and sent to a lab and screened for bacteria.  Nasal cytology A sample of mucus is taken from your nose and examined by your caregiver to determine if your sinusitis is related to an allergy. TREATMENT  Most cases of acute sinusitis are related to a viral infection and will resolve on their own within 10 days. Sometimes medicines are prescribed to help relieve symptoms (pain medicine, decongestants, nasal steroid sprays, or saline sprays).  However, for sinusitis related to a bacterial infection, your caregiver will prescribe antibiotic medicines. These are medicines that will help  kill the bacteria causing the infection.  Rarely, sinusitis is caused by a fungal infection. In theses cases, your caregiver will prescribe antifungal medicine. For some cases of chronic sinusitis, surgery is needed. Generally, these are cases in which sinusitis recurs more than 3 times per year, despite other treatments. HOME CARE INSTRUCTIONS   Drink plenty of water. Water helps thin the mucus so your sinuses can drain more easily.  Use a humidifier.  Inhale steam 3 to 4 times a day (for example, sit in the bathroom with the shower running).  Apply a warm, moist washcloth to your face 3 to 4 times a day, or as directed by your caregiver.  Use saline nasal sprays to help moisten and clean your sinuses.  Take  over-the-counter or prescription medicines for pain, discomfort, or fever only as directed by your caregiver. SEEK IMMEDIATE MEDICAL CARE IF:  You have increasing pain or severe headaches.  You have nausea, vomiting, or drowsiness.  You have swelling around your face.  You have vision problems.  You have a stiff neck.  You have difficulty breathing. MAKE SURE YOU:   Understand these instructions.  Will watch your condition.  Will get help right away if you are not doing well or get worse. Document Released: 11/26/2005 Document Revised: 02/18/2012 Document Reviewed: 12/11/2011 [facility] Patient Information 2014 [place], [place].  Cough, Adult  A cough is a reflex that helps clear your throat and airways. It can help heal the body or may be a reaction to an irritated airway. A cough may only last 2 or 3 weeks (acute) or may last more than 8 weeks (chronic).  CAUSES Acute cough:  Viral or bacterial infections. Chronic cough:  Infections.  Allergies.  Asthma.  Post-nasal drip.  Smoking.  Heartburn or acid reflux.  Some medicines.  Chronic lung problems (COPD).  Cancer. SYMPTOMS   Cough.  Fever.  Chest pain.  Increased breathing rate.  High-pitched whistling sound when breathing (wheezing).  Colored mucus that you cough up (sputum). TREATMENT   A bacterial cough may be treated with antibiotic medicine.  A viral cough must run its course and will not respond to antibiotics.  Your caregiver may recommend other treatments if you have a chronic cough. HOME CARE INSTRUCTIONS   Only take over-the-counter or prescription medicines for pain, discomfort, or fever as directed by your caregiver. Use cough suppressants only as directed by your caregiver.  Use a cold steam vaporizer or humidifier in your bedroom or home to help loosen secretions.  Sleep in a semi-upright position if your cough is worse at night.  Rest as needed.  Stop smoking if you  smoke. SEEK IMMEDIATE MEDICAL CARE IF:   You have pus in your sputum.  Your cough starts to worsen.  You cannot control your cough with suppressants and are losing sleep.  You begin coughing up blood.  You have difficulty breathing.  You develop pain which is getting worse or is uncontrolled with medicine.  You have a fever. MAKE SURE YOU:   Understand these instructions.  Will watch your condition.  Will get help right away if you are not doing well or get worse. Document Released: 05/25/2011 Document Revised: 02/18/2012 Document Reviewed: 05/25/2011 [facility] Patient Information 2014 [place].   Fall Prevention and Home Safety Falls cause injuries and can affect all age groups. It is possible to use preventive measures to significantly decrease the likelihood of falls. There are many simple measures which can make your home safer and  prevent falls. OUTDOORS  Repair cracks and edges of walkways and driveways.  Remove high doorway thresholds.  Trim shrubbery on the main path into your home.  Have good outside lighting.  Clear walkways of tools, rocks, debris, and clutter.  Check that handrails are not broken and are securely fastened. Both sides of steps should have handrails.  Have leaves, snow, and ice cleared regularly.  Use sand or salt on walkways during winter months.  In the garage, clean up grease or oil spills. BATHROOM  Install night lights.  Install grab bars by the toilet and in the tub and shower.  Use non-skid mats or decals in the tub or shower.  Place a plastic non-slip stool in the shower to sit on, if needed.  Keep floors dry and clean up all water on the floor immediately.  Remove soap buildup in the tub or shower on a regular basis.  Secure bath mats with non-slip, double-sided rug tape.  Remove throw rugs and tripping hazards from the floors. BEDROOMS  Install night lights.  Make sure a bedside light is easy to  reach.  Do not use oversized bedding.  Keep a telephone by your bedside.  Have a firm chair with side arms to use for getting dressed.  Remove throw rugs and tripping hazards from the floor. KITCHEN  Keep handles on pots and pans turned toward the center of the stove. Use back burners when possible.  Clean up spills quickly and allow time for drying.  Avoid walking on wet floors.  Avoid hot utensils and knives.  Position shelves so they are not too high or low.  Place commonly used objects within easy reach.  If necessary, use a sturdy step stool with a grab bar when reaching.  Keep electrical cables out of the way.  Do not use floor polish or wax that makes floors slippery. If you must use wax, use non-skid floor wax.  Remove throw rugs and tripping hazards from the floor. STAIRWAYS  Never leave objects on stairs.  Place handrails on both sides of stairways and use them. Fix any loose handrails. Make sure handrails on both sides of the stairways are as long as the stairs.  Check carpeting to make sure it is firmly attached along stairs. Make repairs to worn or loose carpet promptly.  Avoid placing throw rugs at the top or bottom of stairways, or properly secure the rug with carpet tape to prevent slippage. Get rid of throw rugs, if possible.  Have an electrician put in a light switch at the top and bottom of the stairs. OTHER FALL PREVENTION TIPS  Wear low-heel or rubber-soled shoes that are supportive and fit well. Wear closed toe shoes.  When using a stepladder, make sure it is fully opened and both spreaders are firmly locked. Do not climb a closed stepladder.  Add color or contrast paint or tape to grab bars and handrails in your home. Place contrasting color strips on first and last steps.  Learn and use mobility aids as needed. Install an electrical emergency response system.  Turn on lights to avoid dark areas. Replace light bulbs that burn out immediately.  Get light switches that glow.  Arrange furniture to create clear pathways. Keep furniture in the same place.  Firmly attach carpet with non-skid or double-sided tape.  Eliminate uneven floor surfaces.  Select a carpet pattern that does not visually hide the edge of steps.  Be aware of all pets. OTHER HOME SAFETY TIPS  Set the water temperature for 120 F (48.8 C).  Keep emergency numbers on or near the telephone.  Keep smoke detectors on every level of the home and near sleeping areas. Document Released: 11/16/2002 Document Revised: 05/27/2012 Document Reviewed: 02/15/2012 [facility] Patient Information 2014 [place].

## 2014-01-01 ENCOUNTER — Encounter: Payer: Self-pay | Admitting: Internal Medicine

## 2014-01-01 LAB — MICROALBUMIN / CREATININE URINE RATIO
Creatinine, Urine: 129 mg/dL
Microalb Creat Ratio: 170.9 mg/g — ABNORMAL HIGH (ref 0.0–30.0)
Microalb, Ur: 22.04 mg/dL — ABNORMAL HIGH (ref 0.00–1.89)

## 2014-01-01 NOTE — Progress Notes (Signed)
Case discussed with Dr. McLean soon after the resident saw the patient.  We reviewed the resident's history and exam and pertinent patient test results.  I agree with the assessment, diagnosis, and plan of care documented in the resident's note. 

## 2014-02-05 ENCOUNTER — Telehealth: Payer: Self-pay | Admitting: *Deleted

## 2014-02-05 NOTE — Telephone Encounter (Signed)
Pharmacy states crestor is $135.00 with insurance, could you prescribe something cheaper?

## 2014-02-08 ENCOUNTER — Other Ambulatory Visit: Payer: Self-pay | Admitting: Internal Medicine

## 2014-02-08 MED ORDER — PRAVASTATIN SODIUM 40 MG PO TABS: 40.0000 mg | ORAL_TABLET | Freq: Every day | ORAL | Status: DC

## 2014-04-01 ENCOUNTER — Encounter: Payer: Self-pay | Admitting: Internal Medicine

## 2014-04-01 ENCOUNTER — Ambulatory Visit (INDEPENDENT_AMBULATORY_CARE_PROVIDER_SITE_OTHER): Payer: Medicare HMO | Admitting: Internal Medicine

## 2014-04-01 VITALS — BP 127/77 | HR 80 | Temp 97.3°F | Ht 71.0 in | Wt 237.0 lb

## 2014-04-01 DIAGNOSIS — J309 Allergic rhinitis, unspecified: Secondary | ICD-10-CM

## 2014-04-01 DIAGNOSIS — M25511 Pain in right shoulder: Secondary | ICD-10-CM

## 2014-04-01 DIAGNOSIS — E119 Type 2 diabetes mellitus without complications: Secondary | ICD-10-CM

## 2014-04-01 DIAGNOSIS — Z720 Tobacco use: Secondary | ICD-10-CM

## 2014-04-01 DIAGNOSIS — I10 Essential (primary) hypertension: Secondary | ICD-10-CM

## 2014-04-01 DIAGNOSIS — E785 Hyperlipidemia, unspecified: Secondary | ICD-10-CM

## 2014-04-01 DIAGNOSIS — R269 Unspecified abnormalities of gait and mobility: Secondary | ICD-10-CM | POA: Insufficient documentation

## 2014-04-01 DIAGNOSIS — F17200 Nicotine dependence, unspecified, uncomplicated: Secondary | ICD-10-CM

## 2014-04-01 DIAGNOSIS — Z0000 Encounter for general adult medical examination without abnormal findings: Secondary | ICD-10-CM

## 2014-04-01 DIAGNOSIS — M25519 Pain in unspecified shoulder: Secondary | ICD-10-CM

## 2014-04-01 LAB — POCT GLYCOSYLATED HEMOGLOBIN (HGB A1C): HEMOGLOBIN A1C: 7.5

## 2014-04-01 LAB — GLUCOSE, CAPILLARY: GLUCOSE-CAPILLARY: 138 mg/dL — AB (ref 70–99)

## 2014-04-01 MED ORDER — PRAVASTATIN SODIUM 40 MG PO TABS: 40.0000 mg | ORAL_TABLET | Freq: Every day | ORAL | Status: DC

## 2014-04-01 MED ORDER — METFORMIN HCL 1000 MG PO TABS: 1000.0000 mg | ORAL_TABLET | Freq: Two times a day (BID) | ORAL | Status: DC

## 2014-04-01 NOTE — Addendum Note (Signed)
Addended by: Cresenciano Genre on: 04/01/2014 05:31 PM   Modules accepted: Level of Service

## 2014-04-01 NOTE — Assessment & Plan Note (Addendum)
  Assessment: Progress toward smoking cessation:  smoking less Barriers to progress toward smoking cessation:   (duration of smoking) Comments: none. Not ready to quit   Plan: Instruction/counseling given:  I counseled patient on the dangers of tobacco use, advised patient to stop smoking, and reviewed strategies to maximize success. Educational resources provided:  other (see comments) Self management tools provided:  other (see comments) Medications to assist with smoking cessation:  None Patient agreed to the following self-care plans for smoking cessation: call [facility] (1-800-QUIT-NOW)  Other plans: keep reassessing for cessation

## 2014-04-01 NOTE — Assessment & Plan Note (Addendum)
Lab Results  Component Value Date   HGBA1C 7.5 04/01/2014   HGBA1C 6.1 12/31/2013   HGBA1C 6.0 04/22/2013     Assessment: Diabetes control: fair control Progress toward A1C goal:  deteriorated Comments:  7.5 HA1C today   Plan: Medications:  resume Metformin 1000 mg bid  Home glucose monitoring: Frequency: no home glucose monitoring Timing: N/A Instruction/counseling given: discussed foot care and provided printed educational material Educational resources provided: other (see comments) Self management tools provided: other (see comments) Other plans: f/u in 3 months, resume Metformin 1000 mg bid

## 2014-04-01 NOTE — Assessment & Plan Note (Signed)
Fall Screening 12/31/2012 04/22/2013 12/31/2013 04/01/2014 04/01/2014  Falls in the past year? No No Yes No Yes  Number of falls in past year - - 2 or more - 2 or more  Risk Factor Category  - - High Fall Risk - High Fall Risk      Assessment: Progress toward fall prevention goal:  deteriorated Comments: trips over items in his pantry seem mechanical   Plan: Fall prevention plans: read info given  Educational resources provided: other (see comments) Self management tools provided: other (see comments)

## 2014-04-01 NOTE — Patient Instructions (Addendum)
General Instructions: Follow up in 3-6 months, sooner if needed  Take care  Reconsider the pneumonia vaccine  Try to quit smoking  Resume Metformin 1000 mg bid   Treatment Goals:  Goals (1 Years of Data) as of 04/01/14         As of Today 12/31/13 12/31/13 04/22/13 12/31/12     Blood Pressure    . Blood Pressure < 140/90  127/77 136/89 142/89 126/83 136/86     Result Component    . HEMOGLOBIN A1C < 7.0   6.1  6.0 6.5    . LDL CALC < 100   74   165      Progress Toward Treatment Goals:  Treatment Goal 04/01/2014  Hemoglobin A1C at goal  Blood pressure at goal  Stop smoking unable to assess  Prevent falls deteriorated    Self Care Goals & Plans:  Self Care Goal 04/01/2014  Manage my medications take my medicines as prescribed; bring my medications to every visit; refill my medications on time  Monitor my health keep track of my blood pressure; check my feet daily; keep track of my weight  Eat healthy foods drink diet soda or water instead of juice or soda; eat more vegetables; eat foods that are low in salt; eat baked foods instead of fried foods; eat fruit for snacks and desserts; eat smaller portions  Be physically active find an activity I enjoy  Stop smoking call [facility] (1-800-QUIT-NOW)  Meeting treatment goals maintain the current self-care plan    Home Blood Glucose Monitoring 04/01/2014  Check my blood sugar no home glucose monitoring  When to check my blood sugar N/A     Care Management & Community Referrals:  Referral 04/01/2014  Referrals made for care management support none needed  Referrals made to community resources none       Fall Prevention and Home Safety Falls cause injuries and can affect all age groups. It is possible to prevent falls.  HOW TO PREVENT FALLS  Wear shoes with rubber soles that do not have an opening for your toes.  Keep the inside and outside of your house well lit.  Use night lights throughout your home.  Remove clutter  from floors.  Clean up floor spills.  Remove throw rugs or fasten them to the floor with carpet tape.  Do not place electrical cords across pathways.  Put grab bars by your tub, shower, and toilet. Do not use towel bars as grab bars.  Put handrails on both sides of the stairway. Fix loose handrails.  Do not climb on stools or stepladders, if possible.  Do not wax your floors.  Repair uneven or unsafe sidewalks, walkways, or stairs.  Keep items you use a lot within reach.  Be aware of pets.  Keep emergency numbers next to the telephone.  Put smoke detectors in your home and near bedrooms. Ask your doctor what other things you can do to prevent falls. Document Released: 09/22/2009 Document Revised: 05/27/2012 Document Reviewed: 02/26/2012 [facility] Patient Information 2014 [place], [place].  Pneumococcal Vaccine, Polyvalent solution for injection What is this medicine? PNEUMOCOCCAL VACCINE, POLYVALENT (NEU mo KOK al vak SEEN, pol ee VEY luhnt) is a vaccine to prevent pneumococcus bacteria infection. These bacteria are a major cause of ear infections, Strep throat infections, and serious pneumonia, meningitis, or blood infections worldwide. These vaccines help the body to produce antibodies (protective substances) that help your body defend against these bacteria. This vaccine is recommended for people 2  years of age and older with health problems. It is also recommended for all adults over 49 years old. This vaccine will not treat an infection. This medicine may be used for other purposes; ask your health care provider or pharmacist if you have questions. COMMON BRAND NAME(S): Pneumovax 23 What should I tell my health care provider before I take this medicine? They need to know if you have any of these conditions: -bleeding problems -bone marrow or organ transplant -cancer, Hodgkin's disease -fever -infection -immune system problems -low platelet count in the  blood -seizures -an unusual or allergic reaction to pneumococcal vaccine, diphtheria toxoid, other vaccines, latex, other medicines, foods, dyes, or preservatives -pregnant or trying to get pregnant -breast-feeding How should I use this medicine? This vaccine is for injection into a muscle or under the skin. It is given by a health care professional. A copy of Vaccine Information Statements will be given before each vaccination. Read this sheet carefully each time. The sheet may change frequently. Talk to your pediatrician regarding the use of this medicine in children. While this drug may be prescribed for children as young as 52 years of age for selected conditions, precautions do apply. Overdosage: If you think you have taken too much of this medicine contact a poison control center or emergency room at once. NOTE: This medicine is only for you. Do not share this medicine with others. What if I miss a dose? It is important not to miss your dose. Call your doctor or health care professional if you are unable to keep an appointment. What may interact with this medicine? -medicines for cancer chemotherapy -medicines that suppress your immune function -medicines that treat or prevent blood clots like warfarin, enoxaparin, and dalteparin -steroid medicines like prednisone or cortisone This list may not describe all possible interactions. Give your health care provider a list of all the medicines, herbs, non-prescription drugs, or dietary supplements you use. Also tell them if you smoke, drink alcohol, or use illegal drugs. Some items may interact with your medicine. What should I watch for while using this medicine? Mild fever and pain should go away in 3 days or less. Report any unusual symptoms to your doctor or health care professional. What side effects may I notice from receiving this medicine? Side effects that you should report to your doctor or health care professional as soon as  possible: -allergic reactions like skin rash, itching or hives, swelling of the face, lips, or tongue -breathing problems -confused -fever over 102 degrees F -pain, tingling, numbness in the hands or feet -seizures -unusual bleeding or bruising -unusual muscle weakness Side effects that usually do not require medical attention (report to your doctor or health care professional if they continue or are bothersome): -aches and pains -diarrhea -fever of 102 degrees F or less -headache -irritable -loss of appetite -pain, tender at site where injected -trouble sleeping This list may not describe all possible side effects. Call your doctor for medical advice about side effects. You may report side effects to FDA at 1-800-FDA-1088. Where should I keep my medicine? This does not apply. This vaccine is given in a clinic, pharmacy, doctor's office, or other health care setting and will not be stored at home. NOTE: This sheet is a summary. It may not cover all possible information. If you have questions about this medicine, talk to your doctor, pharmacist, or health care provider.  2014, Elsevier/Gold Standard. (2008-07-02 14:32:37)  Smoking Cessation Quitting smoking is important to your health  and has many advantages. However, it is not always easy to quit since nicotine is a very addictive drug. Often times, people try 3 times or more before being able to quit. This document explains the best ways for you to prepare to quit smoking. Quitting takes hard work and a lot of effort, but you can do it. ADVANTAGES OF QUITTING SMOKING  You will live longer, feel better, and live better.  Your body will feel the impact of quitting smoking almost immediately.  Within 20 minutes, blood pressure decreases. Your pulse returns to its normal level.  After 8 hours, carbon monoxide levels in the blood return to normal. Your oxygen level increases.  After 24 hours, the chance of having a heart attack  starts to decrease. Your breath, hair, and body stop smelling like smoke.  After 48 hours, damaged nerve endings begin to recover. Your sense of taste and smell improve.  After 72 hours, the body is virtually free of nicotine. Your bronchial tubes relax and breathing becomes easier.  After 2 to 12 weeks, lungs can hold more air. Exercise becomes easier and circulation improves.  The risk of having a heart attack, stroke, cancer, or lung disease is greatly reduced.  After 1 year, the risk of coronary heart disease is cut in half.  After 5 years, the risk of stroke falls to the same as a nonsmoker.  After 10 years, the risk of lung cancer is cut in half and the risk of other cancers decreases significantly.  After 15 years, the risk of coronary heart disease drops, usually to the level of a nonsmoker.  If you are pregnant, quitting smoking will improve your chances of having a healthy baby.  The people you live with, especially any children, will be healthier.  You will have extra money to spend on things other than cigarettes. QUESTIONS TO THINK ABOUT BEFORE ATTEMPTING TO QUIT You may want to talk about your answers with your caregiver.  Why do you want to quit?  If you tried to quit in the past, what helped and what did not?  What will be the most difficult situations for you after you quit? How will you plan to handle them?  Who can help you through the tough times? Your family? Friends? A caregiver?  What pleasures do you get from smoking? What ways can you still get pleasure if you quit? Here are some questions to ask your caregiver:  How can you help me to be successful at quitting?  What medicine do you think would be best for me and how should I take it?  What should I do if I need more help?  What is smoking withdrawal like? How can I get information on withdrawal? GET READY  Set a quit date.  Change your environment by getting rid of all cigarettes, ashtrays,  matches, and lighters in your home, car, or work. Do not let people smoke in your home.  Review your past attempts to quit. Think about what worked and what did not. GET SUPPORT AND ENCOURAGEMENT You have a better chance of being successful if you have help. You can get support in many ways.  Tell your family, friends, and co-workers that you are going to quit and need their support. Ask them not to smoke around you.  Get individual, group, or telephone counseling and support. Programs are available at [employer] and health centers. Call your local health department for information about programs in your area.  Spiritual  beliefs and practices may help some smokers quit.  Download a "quit meter" on your computer to keep track of quit statistics, such as how long you have gone without smoking, cigarettes not smoked, and money saved.  Get a self-help book about quitting smoking and staying off of tobacco. [place] yourself from urges to smoke. Talk to someone, go for a walk, or occupy your time with a task.  Change your normal routine. Take a different route to work. Drink tea instead of coffee. Eat breakfast in a different place.  Reduce your stress. Take a hot bath, exercise, or read a book.  Plan something enjoyable to do every day. Reward yourself for not smoking.  Explore interactive web-based programs that specialize in helping you quit. GET MEDICINE AND USE IT CORRECTLY Medicines can help you stop smoking and decrease the urge to smoke. Combining medicine with the above behavioral methods and support can greatly increase your chances of successfully quitting smoking.  Nicotine replacement therapy helps deliver nicotine to your body without the negative effects and risks of smoking. Nicotine replacement therapy includes nicotine gum, lozenges, inhalers, nasal sprays, and skin patches. Some may be available over-the-counter and others require a  prescription.  Antidepressant medicine helps people abstain from smoking, but how this works is unknown. This medicine is available by prescription.  Nicotinic receptor partial agonist medicine simulates the effect of nicotine in your brain. This medicine is available by prescription. Ask your caregiver for advice about which medicines to use and how to use them based on your health history. Your caregiver will tell you what side effects to look out for if you choose to be on a medicine or therapy. Carefully read the information on the package. Do not use any other product containing nicotine while using a nicotine replacement product.  RELAPSE OR DIFFICULT SITUATIONS Most relapses occur within the first 3 months after quitting. Do not be discouraged if you start smoking again. Remember, most people try several times before finally quitting. You may have symptoms of withdrawal because your body is used to nicotine. You may crave cigarettes, be irritable, feel very hungry, cough often, get headaches, or have difficulty concentrating. The withdrawal symptoms are only temporary. They are strongest when you first quit, but they will go away within 10 14 days. To reduce the chances of relapse, try to:  Avoid drinking alcohol. Drinking lowers your chances of successfully quitting.  Reduce the amount of caffeine you consume. Once you quit smoking, the amount of caffeine in your body increases and can give you symptoms, such as a rapid heartbeat, sweating, and anxiety.  Avoid smokers because they can make you want to smoke.  Do not let weight gain distract you. Many smokers will gain weight when they quit, usually less than 10 pounds. Eat a healthy diet and stay active. You can always lose the weight gained after you quit.  Find ways to improve your mood other than smoking. FOR MORE INFORMATION  www.smokefree.gov  Document Released: 11/20/2001 Document Revised: 05/27/2012 Document Reviewed:  03/06/2012 [facility] Patient Information 2014 [place], [place].  Type 2 Diabetes Mellitus, Adult Type 2 diabetes mellitus is a long-term (chronic) disease. In type 2 diabetes:  The pancreas does not make enough of a hormone called insulin.  The cells in the body do not respond as well to the insulin that is made.  Both of the above can happen. Normally, insulin moves sugars from food into tissue cells. This gives  you energy. If you have type 2 diabetes, sugars cannot be moved into tissue cells. This causes high blood sugar (hyperglycemia).  HOME CARE  Have your hemoglobin A1c level checked twice a year. The level shows if your diabetes is under control or out of control.  Perform daily blood sugar testing as told by your doctor.  Check your ketone levels by testing your pee (urine) when you are sick and as told.  Take your diabetes or insulin medicine as told by your doctor.  Never run out of insulin.  Adjust how much insulin you give yourself based on how many carbs (carbohydrates) you eat. Carbs are in many foods, such as fruits, vegetables, whole grains, and dairy products.  Have a healthy snack between every healthy meal. Have 3 meals and 3 snacks a day.  Lose weight if you are overweight.  Carry a medical alert card or wear your medical alert jewelry.  Carry a 15 gram carb snack with you at all times. Examples include:  Glucose pills, 3 or 4.  Glucose gel, 15 gram tube.  Raisins, 2 tablespoons (24 grams).  Jelly beans, 6.  Animal crackers, 8.  Sugar pop, 4 ounces (120 milliliters).  Gummy treats, 9.  Notice low blood sugar (hypoglycemia) symptoms, such as:  Shaking (tremors).  Decreased ability to think clearly.  Sweating.  Increased heart rate.  Headache.  Dry mouth.  Hunger.  Crabbiness (irritability).  Being worried or tense (anxiety).  Restless sleep.  A change in speech or coordination.  Confusion.  Treat low blood sugar right away. If  you are alert and can swallow, follow the 15:15 rule:  Take 15 20 grams of a rapid-acting glucose or carb. This includes glucose gel, glucose pills, or 4 ounces (120 milliliters) of fruit juice, regular pop, or low-fat milk.  Check your blood sugar level after taking the glucose.  Take 15 20 grams of more glucose if the repeat blood sugar level is still 70 mg/dL (milligrams/deciliter) or below.  Eat a meal or snack within 1 hour of the blood sugar levels going back to normal.  Notice early symptoms of high blood sugar, such as:  Being really thirsty or drinking a lot (polydipsia).  Peeing (urinating) a lot (polyuria).  Do at least 150 minutes of physical activity a week or as told.  Split the 150 minutes of activity up during the week. Do not do 150 minutes of activity in one day.  Perform exercises, such as weight lifting, at least 2 times a week or as told.  Adjust your insulin or food intake as needed if you start a new exercise or sport.  Follow your sick day plan when you are not able to eat or drink as usual.  Avoid tobacco use.  Women who are not pregnant should drink no more than 1 drink a day. Men should drink no more than 2 drinks a day.  Only drink alcohol with food.  Ask your doctor if alcohol is safe for you.  Tell your doctor if you drink alcohol several times during the week.  See your doctor regularly.  Schedule an eye exam soon after you are diagnosed with diabetes. Schedule exams once every year.  Check your skin and feet every day. Check for cuts, bruises, redness, nail problems, bleeding, blisters, or sores. A doctor should do a foot exam once a year.  Brush your teeth and gums twice a day. Floss once a day. Visit your dentist regularly.  Share your diabetes  plan with your workplace or school.  Stay up-to-date with shots that fight against diseases (immunizations).  Learn how to manage stress.  Get diabetes education and support as needed.  Ask  your doctor for special help if:  You need help to maintain or improve how you to do things on your own.  You need help to maintain or improve the quality of your life.  You have foot or hand problems.  You have trouble cleaning yourself, dressing, eating, or doing physical activity. GET HELP RIGHT AWAY IF:  You have trouble breathing.  You have moderate to large ketone levels.  You are unable to eat food or drink fluids for more than 6 hours.  You feel sick to your stomach (nauseous) or throw up (vomit) for more than 6 hours.  Your blood sugar level is over 240 mg/dL.  There is a change in mental status.  You get another serious illness.  You have watery poop (diarrhea) for more than 6 hours.  You have been sick or have had a fever for 2 or more days and are not getting better.  You have pain when you are physically active. MAKE SURE YOU:  Understand these instructions.  Will watch your condition.  Will get help right away if you are not doing well or get worse. Document Released: 09/04/2008 Document Revised: 09/16/2013 Document Reviewed: 03/27/2013 [facility] Patient Information 2014 [place], [place].  DASH Diet The DASH diet stands for "Dietary Approaches to Stop Hypertension." It is a healthy eating plan that has been shown to reduce high blood pressure (hypertension) in as little as 14 days, while also possibly providing other significant health benefits. These other health benefits include reducing the risk of breast cancer after menopause and reducing the risk of type 2 diabetes, heart disease, colon cancer, and stroke. Health benefits also include weight loss and slowing kidney failure in patients with chronic kidney disease.  DIET GUIDELINES  Limit salt (sodium). Your diet should contain less than 1500 mg of sodium daily.  Limit refined or processed carbohydrates. Your diet should include mostly whole grains. Desserts and added sugars should be used  sparingly.  Include small amounts of heart-healthy fats. These types of fats include nuts, oils, and tub margarine. Limit saturated and trans fats. These fats have been shown to be harmful in the body. CHOOSING FOODS  The following food groups are based on a 2000 calorie diet. See your Registered Dietitian for individual calorie needs. Grains and Grain Products (6 to 8 servings daily)  Eat More Often: Whole-wheat bread, brown rice, whole-grain or wheat pasta, quinoa, popcorn without added fat or salt (air popped).  Eat Less Often: White bread, white pasta, white rice, cornbread. Vegetables (4 to 5 servings daily)  Eat More Often: Fresh, frozen, and canned vegetables. Vegetables may be raw, steamed, roasted, or grilled with a minimal amount of fat.  Eat Less Often/Avoid: Creamed or fried vegetables. Vegetables in a cheese sauce. Fruit (4 to 5 servings daily)  Eat More Often: All fresh, canned (in natural juice), or frozen fruits. Dried fruits without added sugar. One hundred percent fruit juice ( cup [237 mL] daily).  Eat Less Often: Dried fruits with added sugar. Canned fruit in light or heavy syrup. [employer], Fish, and Poultry (2 servings or less daily. One serving is 3 to 4 oz [85-114 g]).  Eat More Often: Ninety percent or leaner ground beef, tenderloin, sirloin. Round cuts of beef, chicken breast, [place] breast. All fish. Grill, bake, or  broil your meat. Nothing should be fried.  Eat Less Often/Avoid: Fatty cuts of meat, [place], or chicken leg, thigh, or wing. Fried cuts of meat or fish. Dairy (2 to 3 servings)  Eat More Often: Low-fat or fat-free milk, low-fat plain or light yogurt, reduced-fat or part-skim cheese.  Eat Less Often/Avoid: Milk (whole, 2%).Whole milk yogurt. Full-fat cheeses. Nuts, Seeds, and Legumes (4 to 5 servings per week)  Eat More Often: All without added salt.  Eat Less Often/Avoid: Salted nuts and seeds, canned beans with added salt. Fats and Sweets  (limited)  Eat More Often: Vegetable oils, tub margarines without trans fats, sugar-free gelatin. Mayonnaise and salad dressings.  Eat Less Often/Avoid: Coconut oils, palm oils, butter, stick margarine, cream, half and half, cookies, candy, pie. FOR MORE INFORMATION The Dash Diet Eating Plan: www.dashdiet.org Document Released: 11/15/2011 Document Revised: 02/18/2012 Document Reviewed: 11/15/2011 [facility] Patient Information 2014 [place], [place].  Hypertension As your heart beats, it forces blood through your arteries. This force is your blood pressure. If the pressure is too high, it is called hypertension (HTN) or high blood pressure. HTN is dangerous because you may have it and not know it. High blood pressure may mean that your heart has to work harder to pump blood. Your arteries may be narrow or stiff. The extra work puts you at risk for heart disease, stroke, and other problems.  Blood pressure consists of two numbers, a higher number over a lower, 110/72, for example. It is stated as "110 over 72." The ideal is below 120 for the top number (systolic) and under 80 for the bottom (diastolic). Write down your blood pressure today. You should pay close attention to your blood pressure if you have certain conditions such as:  Heart failure.  Prior heart attack.  Diabetes  Chronic kidney disease.  Prior stroke.  Multiple risk factors for heart disease. To see if you have HTN, your blood pressure should be measured while you are seated with your arm held at the level of the heart. It should be measured at least twice. A one-time elevated blood pressure reading (especially in the Emergency Department) does not mean that you need treatment. There may be conditions in which the blood pressure is different between your right and left arms. It is important to see your caregiver soon for a recheck. Most people have essential hypertension which means that there is not a specific cause. This type  of high blood pressure may be lowered by changing lifestyle factors such as:  Stress.  Smoking.  Lack of exercise.  Excessive weight.  Drug/tobacco/alcohol use.  Eating less salt. Most people do not have symptoms from high blood pressure until it has caused damage to the body. Effective treatment can often prevent, delay or reduce that damage. TREATMENT  When a cause has been identified, treatment for high blood pressure is directed at the cause. There are a large number of medications to treat HTN. These fall into several categories, and your caregiver will help you select the medicines that are best for you. Medications may have side effects. You should review side effects with your caregiver. If your blood pressure stays high after you have made lifestyle changes or started on medicines,   Your medication(s) may need to be changed.  Other problems may need to be addressed.  Be certain you understand your prescriptions, and know how and when to take your medicine.  Be sure to follow up with your caregiver within the time frame advised (  usually within two weeks) to have your blood pressure rechecked and to review your medications.  If you are taking more than one medicine to lower your blood pressure, make sure you know how and at what times they should be taken. Taking two medicines at the same time can result in blood pressure that is too low. SEEK IMMEDIATE MEDICAL CARE IF:  You develop a severe headache, blurred or changing vision, or confusion.  You have unusual weakness or numbness, or a faint feeling.  You have severe chest or abdominal pain, vomiting, or breathing problems. MAKE SURE YOU:   Understand these instructions.  Will watch your condition.  Will get help right away if you are not doing well or get worse. Document Released: 11/26/2005 Document Revised: 02/18/2012 Document Reviewed: 07/16/2008 [facility] Patient Information 2014 [place].

## 2014-04-01 NOTE — Assessment & Plan Note (Signed)
BP Readings from Last 3 Encounters:  04/01/14 127/77  12/31/13 136/89  04/22/13 126/83    Lab Results  Component Value Date   NA 140 12/31/2013   K 4.8 12/31/2013   CREATININE 0.89 12/31/2013    Assessment: Blood pressure control: controlled Progress toward BP goal:  at goal Comments: none  Plan: Medications:  continue current medications (Lisinopril 20 mg qd)  Educational resources provided: other (see comments) Self management tools provided: other (see comments) Other plans: f/u in 3 months, given info to read

## 2014-04-01 NOTE — Progress Notes (Signed)
   Subjective:    Patient ID: Carlos Bowman, male    DOB: [birth year], 63 y.o.   MRN: [id number]  HPI Comments: 63 y.o Diabetes mellitus history (6.1 cbg 12/2013),  Hyperlipidemia  (LDL 74), GERD (gastroesophageal reflux disease), HTN (BP 127/77), allergic rhinitis, osteoarthritis, tobacco abuse (smokes 1/2 ppd smoking since age 1 smoking less)  He presents for f/u for chronic medical conditions  1) HTN controlled 2) DM-will check HA1C today (7.5, cbg 138). He was off Metformin 1000 mg bid but has been eating cake, ice cream and cookies at night since he cut back on smoking to compensate 3) He is tripping over items at his pantry  4) He has right shoulder pain that is intermittent worse with reaching and reaching backwards. He thinks it is arthritis. Pain gets as worse as 7/10 last 5 min and goes away w/o intervention.  He tries OTC meds that help.  Pain is sharp and radiates from right shoulder to right arm  5) He needs Rx refill of Pravachol and asks for #90 d supply  6)Allergies bothering causing nasal congestion. Taking Clarinex which ehlps.     SH: smoking 1/2 ppd working on quitting used to smoke 2-4 pks/week.  He has been smoking since age 84.  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   Cardiovascular: Negative for chest pain and leg swelling.  Gastrointestinal: Negative for abdominal pain and blood in stool.  Genitourinary: Negative for difficulty urinating.  Neurological: Negative for dizziness.       Objective:   Physical Exam  Nursing note and vitals reviewed. Constitutional: He is oriented to person, place, and time. Vital signs are normal. He appears well-developed and well-nourished. He is cooperative.  HENT:  Head: Normocephalic and atraumatic.  Mouth/Throat: Oropharynx is clear and moist and  mucous membranes are normal. Abnormal dentition. No oropharyngeal exudate.  No teeth  Eyes: Conjunctivae are normal. Pupils are equal, round, and reactive to light. Right eye exhibits no discharge. Left eye exhibits no discharge. No scleral icterus.  Cardiovascular: Normal rate, regular rhythm, S1 normal, S2 normal and normal heart sounds.   No murmur heard. No lower ext edema   Pulmonary/Chest: Effort normal and breath sounds normal.  Abdominal: Soft. Bowel sounds are normal. He exhibits no distension. There is no tenderness.  Musculoskeletal: He exhibits no edema.  Neurological: He is alert and oriented to person, place, and time. Gait normal.  Skin: Skin is warm, dry and intact. No rash noted.  Psychiatric: He has a normal mood and affect. His speech is normal and behavior is normal. Judgment and thought content normal. Cognition and memory are normal.          Assessment & Plan:  F/u in 3 months

## 2014-04-01 NOTE — Assessment & Plan Note (Addendum)
Pt declines pneumonia vx today fear of needles

## 2014-04-01 NOTE — Assessment & Plan Note (Signed)
Rx refill Pravachol #90 d supply

## 2014-04-01 NOTE — Assessment & Plan Note (Signed)
Continue Clarinex 5 mg qd

## 2014-04-01 NOTE — Assessment & Plan Note (Signed)
Etiology could be rotator cuff vs OA Continue OTC tx if helping Return if worsening and consider imaging/Sports medicine referral

## 2014-04-04 NOTE — Progress Notes (Signed)
Case discussed with Dr. McLean soon after the resident saw the patient.  We reviewed the resident's history and exam and pertinent patient test results.  I agree with the assessment, diagnosis, and plan of care documented in the resident's note. 

## 2014-04-05 NOTE — Progress Notes (Signed)
Case discussed with Dr. McLean soon after the resident saw the patient.  We reviewed the resident's history and exam and pertinent patient test results.  I agree with the assessment, diagnosis, and plan of care documented in the resident's note. 

## 2014-06-24 ENCOUNTER — Encounter: Payer: Self-pay | Admitting: Internal Medicine

## 2014-07-08 ENCOUNTER — Encounter: Payer: Self-pay | Admitting: Internal Medicine

## 2014-07-08 ENCOUNTER — Ambulatory Visit (HOSPITAL_COMMUNITY)
Admission: RE | Admit: 2014-07-08 | Discharge: 2014-07-08 | Disposition: A | Discharge status: Home / Self Care | Payer: Medicare HMO | Source: Ambulatory Visit | Attending: Internal Medicine | Admitting: Internal Medicine

## 2014-07-08 ENCOUNTER — Ambulatory Visit (INDEPENDENT_AMBULATORY_CARE_PROVIDER_SITE_OTHER): Payer: Medicare HMO | Admitting: Internal Medicine

## 2014-07-08 ENCOUNTER — Other Ambulatory Visit: Payer: Self-pay | Admitting: Internal Medicine

## 2014-07-08 VITALS — BP 118/68 | HR 70 | Temp 98.3°F | Ht 71.0 in | Wt 230.9 lb

## 2014-07-08 DIAGNOSIS — R05 Cough: Secondary | ICD-10-CM | POA: Diagnosis present

## 2014-07-08 DIAGNOSIS — E119 Type 2 diabetes mellitus without complications: Secondary | ICD-10-CM

## 2014-07-08 DIAGNOSIS — Z720 Tobacco use: Secondary | ICD-10-CM

## 2014-07-08 DIAGNOSIS — Z0000 Encounter for general adult medical examination without abnormal findings: Secondary | ICD-10-CM

## 2014-07-08 DIAGNOSIS — R21 Rash and other nonspecific skin eruption: Secondary | ICD-10-CM

## 2014-07-08 DIAGNOSIS — R634 Abnormal weight loss: Secondary | ICD-10-CM

## 2014-07-08 DIAGNOSIS — F17200 Nicotine dependence, unspecified, uncomplicated: Secondary | ICD-10-CM

## 2014-07-08 DIAGNOSIS — I10 Essential (primary) hypertension: Secondary | ICD-10-CM

## 2014-07-08 DIAGNOSIS — R059 Cough, unspecified: Secondary | ICD-10-CM | POA: Diagnosis present

## 2014-07-08 LAB — CBC WITH DIFFERENTIAL/PLATELET
BASOS ABS: 0 10*3/uL (ref 0.0–0.1)
Basophils Relative: 0 % (ref 0–1)
Eosinophils Absolute: 0.3 10*3/uL (ref 0.0–0.7)
Eosinophils Relative: 3 % (ref 0–5)
HCT: 45 % (ref 39.0–52.0)
Hemoglobin: 15.4 g/dL (ref 13.0–17.0)
LYMPHS PCT: 28 % (ref 12–46)
Lymphs Abs: 2.4 10*3/uL (ref 0.7–4.0)
MCH: 27.1 pg (ref 26.0–34.0)
MCHC: 34.2 g/dL (ref 30.0–36.0)
MCV: 79.1 fL (ref 78.0–100.0)
Monocytes Absolute: 0.4 10*3/uL (ref 0.1–1.0)
Monocytes Relative: 5 % (ref 3–12)
Neutro Abs: 5.5 10*3/uL (ref 1.7–7.7)
Neutrophils Relative %: 64 % (ref 43–77)
Platelets: 231 10*3/uL (ref 150–400)
RBC: 5.69 MIL/uL (ref 4.22–5.81)
RDW: 15.2 % (ref 11.5–15.5)
WBC: 8.6 10*3/uL (ref 4.0–10.5)

## 2014-07-08 LAB — COMPLETE METABOLIC PANEL WITH GFR
ALK PHOS: 51 U/L (ref 39–117)
ALT: 15 U/L (ref 0–53)
AST: 18 U/L (ref 0–37)
Albumin: 4.7 g/dL (ref 3.5–5.2)
BILIRUBIN TOTAL: 0.5 mg/dL (ref 0.2–1.2)
BUN: 10 mg/dL (ref 6–23)
CO2: 25 meq/L (ref 19–32)
Calcium: 9.7 mg/dL (ref 8.4–10.5)
Chloride: 104 mEq/L (ref 96–112)
Creat: 0.93 mg/dL (ref 0.50–1.35)
GFR, Est African American: >89 mL/min
GFR, Est Non African American: 86 mL/min
Glucose, Bld: 109 mg/dL — ABNORMAL HIGH (ref 70–99)
Potassium: 4.5 mEq/L (ref 3.5–5.3)
SODIUM: 140 meq/L (ref 135–145)
TOTAL PROTEIN: 7.2 g/dL (ref 6.0–8.3)

## 2014-07-08 LAB — POCT GLYCOSYLATED HEMOGLOBIN (HGB A1C): HEMOGLOBIN A1C: 6.7

## 2014-07-08 LAB — GLUCOSE, CAPILLARY: Glucose-Capillary: 114 mg/dL — ABNORMAL HIGH (ref 70–99)

## 2014-07-08 MED ORDER — TRIAMCINOLONE ACETONIDE 0.1 % EX CREA: 1.0000 "application " | TOPICAL_CREAM | Freq: Two times a day (BID) | CUTANEOUS | Status: DC

## 2014-07-08 MED ORDER — METFORMIN HCL 1000 MG PO TABS: 500.0000 mg | ORAL_TABLET | Freq: Every day | ORAL | Status: DC

## 2014-07-08 NOTE — Patient Instructions (Addendum)
General Instructions: Your blood pressure looks good today  Please follow up in 3 months  Use steroid cream to rash on leg 2x per day (not face, underarms, private area)-follow up with Dermatology  Please try to quit smoking Take care     Treatment Goals:  Goals (1 Years of Data) as of 07/08/14         07/08/14 04/01/14 12/31/13 12/31/13 04/22/13     Blood Pressure    . Blood Pressure < 140/90  118/68 127/77 136/89 142/89 126/83     Result Component    . HEMOGLOBIN A1C < 7.0  6.7 7.5 6.1  6.0    . LDL CALC < 100    74        Progress Toward Treatment Goals:  Treatment Goal 07/08/2014  Hemoglobin A1C at goal  Blood pressure at goal  Stop smoking smoking less  Prevent falls -    Self Care Goals & Plans:  Self Care Goal 07/08/2014  Manage my medications take my medicines as prescribed; bring my medications to every visit; refill my medications on time  Monitor my health keep track of my blood glucose; keep track of my blood pressure; keep track of my weight; check my feet daily  Eat healthy foods drink diet soda or water instead of juice or soda; eat more vegetables; eat foods that are low in salt; eat baked foods instead of fried foods; eat fruit for snacks and desserts; eat smaller portions  Be physically active find an activity I enjoy  Stop smoking call [facility] (1-800-QUIT-NOW)  Meeting treatment goals maintain the current self-care plan    Home Blood Glucose Monitoring 07/08/2014  Check my blood sugar no home glucose monitoring  When to check my blood sugar N/A     Care Management & Community Referrals:  Referral 07/08/2014  Referrals made for care management support none needed  Referrals made to community resources none        Triamcinolone skin cream, ointment, lotion, or aerosol What is this medicine? TRIAMCINOLONE (trye am SIN oh lone) is a corticosteroid. It is used on the skin to reduce swelling, redness, itching, and allergic reactions. This medicine  may be used for other purposes; ask your health care provider or pharmacist if you have questions. COMMON BRAND NAME(S): Aristocort, Aristocort A, Aristocort HP, Cinalog, Cinolar, DERMASORB TA Complete, Flutex, Kenalog, Pediaderm TA, SP Rx 228, Triacet, Trianex, Triderm What should I tell my health care provider before I take this medicine? They need to know if you have any of these conditions: -diabetes -infection, like tuberculosis, herpes, or fungal infection -large areas of burned or damaged skin -skin wasting or thinning -an unusual or allergic reaction to triamcinolone, corticosteroids, other medicines, foods, dyes, or preservatives -pregnant or trying to get pregnant -breast-feeding How should I use this medicine? This medicine is for external use only. Do not take by mouth. Follow the directions on the prescription label. Wash your hands before and after use. Apply a thin film of medicine to the affected area. Do not cover with a bandage or dressing unless your doctor or health care professional tells you to. Do not use on healthy skin or over large areas of skin. Do not get this medicine in your eyes. If you do, rinse out with plenty of cool tap water. It is important not to use more medicine than prescribed. Do not use your medicine more often than directed. Talk to your pediatrician regarding the use of this medicine in  children. Special care may be needed. Elderly patients are more likely to have damaged skin through aging, and this may increase side effects. This medicine should only be used for brief periods and infrequently in older patients. Overdosage: If you think you have taken too much of this medicine contact a poison control center or emergency room at once. NOTE: This medicine is only for you. Do not share this medicine with others. What if I miss a dose? If you miss a dose, use it as soon as you can. If it is almost time for your next dose, use only that dose. Do not use  double or extra doses. What may interact with this medicine? Interactions are not expected. This list may not describe all possible interactions. Give your health care provider a list of all the medicines, herbs, non-prescription drugs, or dietary supplements you use. Also tell them if you smoke, drink alcohol, or use illegal drugs. Some items may interact with your medicine. What should I watch for while using this medicine? Tell your doctor or health care professional if your symptoms do not start to get better within one week. Do not use for more than 14 days. Do not use on healthy skin or over large areas of skin. Tell your doctor or health care professional if you are exposed to anyone with measles or chickenpox, or if you develop sores or blisters that do not heal properly. Do not use an airtight bandage to cover the affected area unless your doctor or health care professional tells you to. If you are to cover the area, follow the instructions carefully. Covering the area where the medicine is applied can increase the amount that passes through the skin and increases the risk of side effects. If treating the diaper area of a child, avoid covering the treated area with tight-fitting diapers or plastic pants. This may increase the amount of medicine that passes through the skin and increase the risk of serious side effects. What side effects may I notice from receiving this medicine? Side effects that you should report to your doctor or health care professional as soon as possible: -burning or itching of the skin -dark red spots on the skin -infection -painful, red, pus filled blisters in hair follicles -thinning of the skin, sunburn more likely especially on the face Side effects that usually do not require medical attention (report to your doctor or health care professional if they continue or are bothersome): -dry skin, irritation -unusual increased growth of hair on the face or body This  list may not describe all possible side effects. Call your doctor for medical advice about side effects. You may report side effects to FDA at 1-800-FDA-1088. Where should I keep my medicine? Keep out of the reach of children. Store at room temperature between 15 and 30 degrees C (59 and 86 degrees F). Do not freeze. Throw away any unused medicine after the expiration date. NOTE: This sheet is a summary. It may not cover all possible information. If you have questions about this medicine, talk to your doctor, pharmacist, or health care provider.  2015, Elsevier/Gold Standard. (2014-03-18 15:59:51)  Eczema Eczema, also called atopic dermatitis, is a skin disorder that causes inflammation of the skin. It causes a red rash and dry, scaly skin. The skin becomes very itchy. Eczema is generally worse during the cooler winter months and often improves with the warmth of summer. Eczema usually starts showing signs in infancy. Some children outgrow eczema, but it may  last through adulthood.  CAUSES  The exact cause of eczema is not known, but it appears to run in families. People with eczema often have a family history of eczema, allergies, asthma, or hay fever. Eczema is not contagious. Flare-ups of the condition may be caused by:   Contact with something you are sensitive or allergic to.   Stress. SIGNS AND SYMPTOMS  Dry, scaly skin.   Red, itchy rash.   Itchiness. This may occur before the skin rash and may be very intense.  DIAGNOSIS  The diagnosis of eczema is usually made based on symptoms and medical history. TREATMENT  Eczema cannot be cured, but symptoms usually can be controlled with treatment and other strategies. A treatment plan might include:  Controlling the itching and scratching.   Use over-the-counter antihistamines as directed for itching. This is especially useful at night when the itching tends to be worse.   Use over-the-counter steroid creams as directed for  itching.   Avoid scratching. Scratching makes the rash and itching worse. It may also result in a skin infection (impetigo) due to a break in the skin caused by scratching.   Keeping the skin well moisturized with creams every day. This will seal in moisture and help prevent dryness. Lotions that contain alcohol and water should be avoided because they can dry the skin.   Limiting exposure to things that you are sensitive or allergic to (allergens).   Recognizing situations that cause stress.   Developing a plan to manage stress.  HOME CARE INSTRUCTIONS   Only take over-the-counter or prescription medicines as directed by your health care provider.   Do not use anything on the skin without checking with your health care provider.   Keep baths or showers short (5 minutes) in warm (not hot) water. Use mild cleansers for bathing. These should be unscented. You may add nonperfumed bath oil to the bath water. It is best to avoid soap and bubble bath.   Immediately after a bath or shower, when the skin is still damp, apply a moisturizing ointment to the entire body. This ointment should be a petroleum ointment. This will seal in moisture and help prevent dryness. The thicker the ointment, the better. These should be unscented.   Keep fingernails cut short. Children with eczema may need to wear soft gloves or mittens at night after applying an ointment.   Dress in clothes made of cotton or cotton blends. Dress lightly, because heat increases itching.   A child with eczema should stay away from anyone with fever blisters or cold sores. The virus that causes fever blisters (herpes simplex) can cause a serious skin infection in children with eczema. SEEK MEDICAL CARE IF:   Your itching interferes with sleep.   Your rash gets worse or is not better within 1 week after starting treatment.   You see pus or soft yellow scabs in the rash area.   You have a fever.   You have a  rash flare-up after contact with someone who has fever blisters.  Document Released: 11/23/2000 Document Revised: 09/16/2013 Document Reviewed: 06/29/2013 [facility] Patient Information 2015 [place], [place]. This information is not intended to replace advice given to you by your health care provider. Make sure you discuss any questions you have with your health care provider.

## 2014-07-09 ENCOUNTER — Encounter: Payer: Self-pay | Admitting: Internal Medicine

## 2014-07-09 DIAGNOSIS — R21 Rash and other nonspecific skin eruption: Secondary | ICD-10-CM | POA: Insufficient documentation

## 2014-07-09 DIAGNOSIS — R634 Abnormal weight loss: Secondary | ICD-10-CM | POA: Insufficient documentation

## 2014-07-09 LAB — TSH: TSH: 0.486 u[IU]/mL (ref 0.350–4.500)

## 2014-07-09 NOTE — Progress Notes (Signed)
   Subjective:    Patient ID: Carlos Bowman, male    DOB: 02/16/1951, 63 y.o.   MRN: [id number]  HPI Comments: 63 y.o PMH DM2, GERD, HLD, HTN, OA, tobacco abuse   He presents for f/u: BP 118/68 1. DM HA1C in 03/2014 was 7.5  Today 6.7 with cbg of 114.  HA1C improved after restarting Metformin 1000 mg qd  2. C/o weight loss he states he used to be in the 260s per chart review pt used to be in the 250s and most recently was 237 at last visit in 03/2014 now 230 lbs.  He states he is eating less, denies fever but c/o chills intermittently, he c/o chronic cough x years noted in the am with white to yellow sputum  3. 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  Rash is sometimes itchy.  He also has another red bumpy rash that appears to right inner calf that has been there for years that is itchy.  He is using Zest soap and Gold Bond diabetic lotion.   4. 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       Review of Systems  Constitutional: Positive for chills. Negative for fever.  Respiratory: Positive for cough. Negative for shortness of breath.   Cardiovascular: Negative for chest pain.  Gastrointestinal: Negative for constipation.  Genitourinary: Negative for difficulty urinating.  Skin: Positive for rash.       Objective:   Physical Exam  Nursing note and vitals reviewed. Constitutional: He is oriented to person, place, and time. Vital signs are normal. He appears well-developed and well-nourished. He is cooperative.  HENT:  Head: Normocephalic and atraumatic.  Mouth/Throat: Oropharynx is clear and  moist and mucous membranes are normal. Abnormal dentition. No oropharyngeal exudate.  Eyes: Conjunctivae are normal. Pupils are equal, round, and reactive to light. Right eye exhibits no discharge. Left eye exhibits no discharge. No scleral icterus.  Cardiovascular: Normal rate, regular rhythm, S1 normal, S2 normal and normal heart sounds.   No murmur heard. No lower ext edema   Pulmonary/Chest: Effort normal and breath sounds normal.  Abdominal: Soft. Bowel sounds are normal. He exhibits no distension. There is no tenderness.  Musculoskeletal: He exhibits no edema.  Neurological: He is alert and oriented to person, place, and time. Gait normal.  Skin: Skin is warm and dry. Rash noted.     Psychiatric: He has a normal mood and affect. His speech is normal and behavior is normal. Judgment and thought content normal. Cognition and memory are normal.          Assessment & Plan:  F/u in 3 months

## 2014-07-09 NOTE — Assessment & Plan Note (Signed)
Pt reports weight loss overtime could be 2/2 decreased po intake will w/u for other causes  CMET, CBC, TSH, CXR with significant smoking history to r/o malignancy (CXR only with bronchitic changes)

## 2014-07-09 NOTE — Assessment & Plan Note (Addendum)
Pt declined vaccines (Tdap, Zostavax, pneumovax) Will refer to [facility] to get new Rx for glasses

## 2014-07-09 NOTE — Progress Notes (Signed)
INTERNAL MEDICINE TEACHING ATTENDING ADDENDUM - Latonga Ponder, MD: I reviewed and discussed at the time of visit with the resident Dr. McLean, the patient's medical history, physical examination, diagnosis and results of pertinent tests and treatment and I agree with the patient's care as documented.  

## 2014-07-09 NOTE — Assessment & Plan Note (Signed)
Lab Results  Component Value Date   HGBA1C 6.7 07/08/2014   HGBA1C 7.5 04/01/2014   HGBA1C 6.1 12/31/2013     Assessment: Diabetes control: good control (HgbA1C at goal) Progress toward A1C goal:  at goal Comments: none  Plan: Medications:  continue current medications (Decrease Metformin 1000 mg qd to 500 mg qd) Home glucose monitoring: Frequency: no home glucose monitoring Timing: N/A Instruction/counseling given: reminded to get eye exam and reminded to bring medications to each visit Educational resources provided: brochure Self management tools provided: none Other plans: f/u in 3 months

## 2014-07-09 NOTE — Assessment & Plan Note (Signed)
BP Readings from Last 3 Encounters:  07/08/14 118/68  04/01/14 127/77  12/31/13 136/89    Lab Results  Component Value Date   NA 140 07/08/2014   K 4.5 07/08/2014   CREATININE 0.93 07/08/2014    Assessment: Blood pressure control: controlled Progress toward BP goal:  at goal Comments: none  Plan: Medications:  continue current medications (Lisinopril 20 mg qd) Educational resources provided: brochure Self management tools provided:   Other plans: f/u in 3 months, CMET today

## 2014-07-09 NOTE — Assessment & Plan Note (Signed)
Pruritic rash to posterior thighs and right medial calf. Rash to thighs ?etiology as fungal vs due to friction, rash to right medial calf ?eczematous change  Will try TMC 0.1 %Cream bid to right medial calf only  Refer to dermatology for second opinion

## 2014-08-13 ENCOUNTER — Encounter: Payer: Self-pay | Admitting: Internal Medicine

## 2014-09-10 ENCOUNTER — Encounter: Payer: Self-pay | Admitting: *Deleted

## 2015-04-07 ENCOUNTER — Other Ambulatory Visit: Payer: Self-pay | Admitting: Internal Medicine

## 2015-04-27 ENCOUNTER — Encounter: Payer: Self-pay | Admitting: *Deleted

## 2015-05-25 ENCOUNTER — Telehealth: Payer: Self-pay | Admitting: Internal Medicine

## 2015-05-25 NOTE — Telephone Encounter (Signed)
Call to patient to confirm appointment for 6/16 at 2:15 no answer

## 2015-05-26 ENCOUNTER — Encounter: Payer: Self-pay | Admitting: Internal Medicine

## 2015-05-26 ENCOUNTER — Ambulatory Visit (INDEPENDENT_AMBULATORY_CARE_PROVIDER_SITE_OTHER): Payer: Medicare HMO | Admitting: Internal Medicine

## 2015-05-26 VITALS — BP 134/80 | HR 71 | Temp 98.0°F | Ht 71.0 in | Wt 221.4 lb

## 2015-05-26 DIAGNOSIS — E785 Hyperlipidemia, unspecified: Secondary | ICD-10-CM | POA: Diagnosis not present

## 2015-05-26 DIAGNOSIS — F17200 Nicotine dependence, unspecified, uncomplicated: Secondary | ICD-10-CM

## 2015-05-26 DIAGNOSIS — Z720 Tobacco use: Secondary | ICD-10-CM

## 2015-05-26 DIAGNOSIS — E119 Type 2 diabetes mellitus without complications: Secondary | ICD-10-CM

## 2015-05-26 DIAGNOSIS — I10 Essential (primary) hypertension: Secondary | ICD-10-CM | POA: Diagnosis not present

## 2015-05-26 DIAGNOSIS — Z79899 Other long term (current) drug therapy: Secondary | ICD-10-CM

## 2015-05-26 DIAGNOSIS — R109 Unspecified abdominal pain: Secondary | ICD-10-CM

## 2015-05-26 DIAGNOSIS — J309 Allergic rhinitis, unspecified: Secondary | ICD-10-CM

## 2015-05-26 DIAGNOSIS — Z0000 Encounter for general adult medical examination without abnormal findings: Secondary | ICD-10-CM

## 2015-05-26 DIAGNOSIS — R1031 Right lower quadrant pain: Secondary | ICD-10-CM

## 2015-05-26 LAB — GLUCOSE, CAPILLARY: GLUCOSE-CAPILLARY: 124 mg/dL — AB (ref 65–99)

## 2015-05-26 LAB — POCT GLYCOSYLATED HEMOGLOBIN (HGB A1C): HEMOGLOBIN A1C: 7.2

## 2015-05-26 MED ORDER — LISINOPRIL 20 MG PO TABS: 20.0000 mg | ORAL_TABLET | Freq: Every day | ORAL | Status: DC

## 2015-05-26 MED ORDER — METFORMIN HCL 500 MG PO TABS: 500.0000 mg | ORAL_TABLET | Freq: Every day | ORAL | Status: DC

## 2015-05-26 MED ORDER — DESLORATADINE 5 MG PO TABS: 5.0000 mg | ORAL_TABLET | Freq: Every day | ORAL | Status: DC

## 2015-05-26 MED ORDER — SALINE SPRAY 0.65 % NA SOLN: 1.0000 | NASAL | Status: DC | PRN

## 2015-05-26 MED ORDER — PRAVASTATIN SODIUM 40 MG PO TABS: 40.0000 mg | ORAL_TABLET | Freq: Every day | ORAL | Status: DC

## 2015-05-26 MED ORDER — ASPIRIN EC 81 MG PO TBEC: 81.0000 mg | DELAYED_RELEASE_TABLET | Freq: Every day | ORAL | Status: AC

## 2015-05-26 NOTE — Patient Instructions (Signed)
General Instructions: Please follow up in 3 months sooner if needed  Take care and take your medications   Treatment Goals:  Goals (1 Years of Data) as of 05/26/15          As of Today 07/08/14 04/01/14 12/31/13     Blood Pressure   . Blood Pressure < 140/90  134/80 118/68 127/77      Result Component   . HEMOGLOBIN A1C < 7.0  7.2 6.7 7.5    . LDL CALC < 100     74      Progress Toward Treatment Goals:  Treatment Goal 07/08/2014  Hemoglobin A1C at goal  Blood pressure at goal  Stop smoking smoking less  Prevent falls -    Self Care Goals & Plans:  Self Care Goal 07/08/2014  Manage my medications take my medicines as prescribed; bring my medications to every visit; refill my medications on time  Monitor my health keep track of my blood glucose; keep track of my blood pressure; keep track of my weight; check my feet daily  Eat healthy foods drink diet soda or water instead of juice or soda; eat more vegetables; eat foods that are low in salt; eat baked foods instead of fried foods; eat fruit for snacks and desserts; eat smaller portions  Be physically active find an activity I enjoy  Stop smoking call [facility] (1-800-QUIT-NOW)  Meeting treatment goals maintain the current self-care plan    Home Blood Glucose Monitoring 07/08/2014  Check my blood sugar no home glucose monitoring  When to check my blood sugar N/A     Care Management & Community Referrals:  Referral 07/08/2014  Referrals made for care management support none needed  Referrals made to community resources none     Type 2 Diabetes Mellitus Type 2 diabetes mellitus is a long-term (chronic) disease. In type 2 diabetes:  The pancreas does not make enough of a hormone called insulin.  The cells in the body do not respond as well to the insulin that is made.  Both of the above can happen. Normally, insulin moves sugars from food into tissue cells. This gives you energy. If you have type 2 diabetes, sugars  cannot be moved into tissue cells. This causes high blood sugar (hyperglycemia).  HOME CARE  Have your hemoglobin A1c level checked twice a year. The level shows if your diabetes is under control or out of control.  Test your blood sugar level every day as told by your doctor.  Check your ketone levels by testing your pee (urine) when you are sick and as told.  Take your diabetes or insulin medicine as told by your doctor.  Never run out of insulin.  Adjust how much insulin you give yourself based on how many carbs (carbohydrates) you eat. Carbs are in many foods, such as fruits, vegetables, whole grains, and dairy products.  Have a healthy snack between every healthy meal. Have 3 meals and 3 snacks a day.  Lose weight if you are overweight.  Carry a medical alert card or wear your medical alert jewelry.  Carry a 15-gram carb snack with you at all times. Examples include:  Glucose pills, 3 or 4.  Glucose gel, 15-gram tube.  Raisins, 2 tablespoons (24 grams).  Jelly beans, 6.  Animal crackers, 8.  Regular (not diet) pop, 4 ounces (120 milliliters).  Gummy treats, 9.  Notice low blood sugar (hypoglycemia) symptoms, such as:  Shaking (tremors).  Trouble thinking clearly.  Sweating.  Faster heart rate.  Headache.  Dry mouth.  Hunger.  Crabbiness (irritability).  Being worried or tense (anxious).  Restless sleep.  A change in speech or coordination.  Confusion.  Treat low blood sugar right away. If you are alert and can swallow, follow the 15:15 rule:  Take 15-20 grams of a rapid-acting glucose or carb. This includes glucose gel, glucose pills, or 4 ounces (120 milliliters) of fruit juice, regular pop, or low-fat milk.  Check your blood sugar level 15 minutes after taking the glucose.  Take 15-20 grams more of glucose if the repeat blood sugar level is still 70 mg/dL (milligrams/deciliter) or below.  Eat a meal or snack within 1 hour of the blood  sugar levels going back to normal.  Notice early symptoms of high blood sugar, such as:  Being really thirsty or drinking a lot (polydipsia).  Peeing a lot (polyuria).  Do at least 150 minutes of physical activity a week or as told.  Split the 150 minutes of activity up during the week. Do not do 150 minutes of activity in one day.  Perform exercises, such as weight lifting, at least 2 times a week or as told.  Spend no more than 90 minutes at one time inactive.  Adjust your insulin or food intake as needed if you start a new exercise or sport.  Follow your sick-day plan when you are not able to eat or drink as usual.  Do not smoke, chew tobacco, or use electronic cigarettes.  Women who are not pregnant should drink no more than 1 drink a day. Men should drink no more than 2 drinks a day.  Only drink alcohol with food.  Ask your doctor if alcohol is safe for you.  Tell your doctor if you drink alcohol several times during the week.  See your doctor regularly.  Schedule an eye exam soon after you are told you have diabetes. Schedule exams once every year.  Check your skin and feet every day. Check for cuts, bruises, redness, nail problems, bleeding, blisters, or sores. A doctor should do a foot exam once a year.  Brush your teeth and gums twice a day. Floss once a day. Visit your dentist regularly.  Share your diabetes plan with your workplace or school.  Stay up-to-date with shots that fight against diseases (immunizations).  Learn how to deal with stress.  Get diabetes education and support as needed.  Ask your doctor for special help if:  You need help to maintain or improve how you do things on your own.  You need help to maintain or improve the quality of your life.  You have foot or hand problems.  You have trouble cleaning yourself, dressing, eating, or doing physical activity. GET HELP IF:  You are unable to eat or drink for more than 6 hours.  You  feel sick to your stomach (nauseous) or throw up (vomit) for more than 6 hours.  Your blood sugar level is over 240 mg/dL.  There is a change in mental status.  You get another serious illness.  You have watery poop (diarrhea) for more than 6 hours.  You have been sick or have had a fever for 2 or more days and are not getting better.  You have pain when you are active. GET HELP RIGHT AWAY IF:  You have trouble breathing.  Your ketone levels are higher than your doctor says they should be. MAKE SURE YOU:  Understand these instructions.  Will watch  your condition.  Will get help right away if you are not doing well or get worse. Document Released: 09/04/2008 Document Revised: 04/12/2014 Document Reviewed: 06/27/2012 [facility] Patient Information 2015 [place], [place]. This information is not intended to replace advice given to you by your health care provider. Make sure you discuss any questions you have with your health care provider.

## 2015-05-27 LAB — COMPLETE METABOLIC PANEL WITHOUT GFR
ALT: 15 U/L (ref 0–53)
AST: 18 U/L (ref 0–37)
Albumin: 4.6 g/dL (ref 3.5–5.2)
Alkaline Phosphatase: 51 U/L (ref 39–117)
BUN: 15 mg/dL (ref 6–23)
CO2: 24 meq/L (ref 19–32)
Calcium: 10 mg/dL (ref 8.4–10.5)
Chloride: 103 meq/L (ref 96–112)
Creat: 0.91 mg/dL (ref 0.50–1.35)
GFR, Est African American: >89 mL/min
GFR, Est Non African American: 88 mL/min
Glucose, Bld: 120 mg/dL — ABNORMAL HIGH (ref 70–99)
Potassium: 4.4 meq/L (ref 3.5–5.3)
Sodium: 137 meq/L (ref 135–145)
Total Bilirubin: 0.4 mg/dL (ref 0.2–1.2)
Total Protein: 7.1 g/dL (ref 6.0–8.3)

## 2015-05-27 LAB — MICROALBUMIN / CREATININE URINE RATIO
Creatinine, Urine: 166.5 mg/dL
Microalb Creat Ratio: 76.3 mg/g — ABNORMAL HIGH (ref 0.0–30.0)
Microalb, Ur: 12.7 mg/dL — ABNORMAL HIGH

## 2015-05-27 LAB — LIPID PANEL
Cholesterol: 288 mg/dL — ABNORMAL HIGH (ref 0–200)
HDL: 31 mg/dL — ABNORMAL LOW
LDL Cholesterol: 205 mg/dL — ABNORMAL HIGH (ref 0–99)
Total CHOL/HDL Ratio: 9.3 ratio
Triglycerides: 262 mg/dL — ABNORMAL HIGH
VLDL: 52 mg/dL — ABNORMAL HIGH (ref 0–40)

## 2015-05-30 ENCOUNTER — Encounter: Payer: Self-pay | Admitting: Internal Medicine

## 2015-05-30 DIAGNOSIS — R109 Unspecified abdominal pain: Secondary | ICD-10-CM | POA: Insufficient documentation

## 2015-05-30 MED ORDER — LISINOPRIL 10 MG PO TABS: 20.0000 mg | ORAL_TABLET | Freq: Every day | ORAL | Status: DC

## 2015-05-30 NOTE — Assessment & Plan Note (Signed)
Encouraged pt to take Clarinex or some antihistamine for environmental allergies. Also Rx nasal saline.  Offered Flonase but pt declined for now

## 2015-05-30 NOTE — Assessment & Plan Note (Signed)
Lab Results  Component Value Date   HGBA1C 7.2 05/26/2015   HGBA1C 6.7 07/08/2014   HGBA1C 7.5 04/01/2014     Assessment: Diabetes control: good control (HgbA1C at goal) Progress toward A1C goal:  at goal Comments: Kleberg with metformin x 4 months   Plan: Medications:  continue current medications encouraged compliance with Metformin 500 mg qd  Home glucose monitoring: Frequency: once a day Timing: before meals Instruction/counseling given: reminded to get eye exam due 07/2015  Other plans: check urine microAlb/Cr today and foot exam, referred for eye exam

## 2015-05-30 NOTE — Assessment & Plan Note (Addendum)
BP Readings from Last 3 Encounters:  05/26/15 134/80  07/08/14 118/68  04/01/14 127/77    Lab Results  Component Value Date   NA 137 05/26/2015   K 4.4 05/26/2015   CREATININE 0.91 05/26/2015    Assessment: Blood pressure control: controlled Progress toward BP goal:  at goal Comments: pt not taking ACEI Lisinopril 20 mg x 4 months   Plan: Medications:  continue current medications though will reduce dose to Lisinopril 10 mg qd  Other plans: Encouraged compliance with meds f/u in 3-4 months, check BMET today

## 2015-05-30 NOTE — Progress Notes (Signed)
   Subjective:    Patient ID: Carlos Bowman, male    DOB: 1951-07-27, 64 y.o.   MRN: [id number]  HPI Comments: 64 y.o PMH DM2, GERD, HLD, HTN, OA, tobacco abuse, seasonal allergies   He presents for f/u: BP 134/80 1. DM HA1C  6.7 to 7.2 today with cbg 124. He was supposed to be on Metformin 500 qd but has not been taking medications for 4 months any of his medications bc it "slipped his mind".  He states his blood glucose ranges from 62 to 170 at home 1 hour after eating and has only eats breakfast and dinner.  Will do foot exam today 2. HTN BP was 134/80 today. He has not been taking Lisinopril 20 mg qd x 4 months  3. He c/o of nasal congestion esp after cutting the grass. He has not been taking any antihistamine  4. He c/o intermittent right flank pain noted 6/15 which feels like a pinch nothing makes worse, nothing makes better. Pain is not present today.  He has not tried any medications.  He does not know if it could be gas but states it could be.  He denies constipation, blood in stool, dysuria.    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  He has been a long term smoker but has cut back. He walks 4 miles per day to his part time job   HM: due to Tdap, Zostavax, pnueumovax pt declines is afraid of injections. Offered low dose CT scan since chronic smoker but pt declines. He does want to be re-referred to [facility] again for annual eye exam        Review of Systems  HENT: Positive for congestion.   Respiratory:       +sob with exertion on inclines   Cardiovascular: Negative for chest pain.  Gastrointestinal: Negative for abdominal pain and blood in stool.  Genitourinary: Negative for dysuria.       Objective:   Physical Exam  Constitutional: He is oriented to person, place, and time. Vital signs are normal. He appears well-developed and well-nourished. He is cooperative.  HENT:  Head: Normocephalic and atraumatic.    Eyes: Conjunctivae are normal. Right eye exhibits no discharge. Left eye exhibits no discharge. No scleral icterus.  Cardiovascular: Normal rate, regular rhythm, S1 normal, S2 normal and normal heart sounds.   No murmur heard. Neg edema  Pulmonary/Chest: Effort normal and breath sounds normal.  Abdominal: Soft. Bowel sounds are normal. He exhibits no distension. There is no tenderness.  Musculoskeletal: He exhibits no edema.  Neurological: He is alert and oriented to person, place, and time. Gait normal.  Skin: Skin is warm and dry. No rash noted.  Psychiatric: He has a normal mood and affect. His speech is normal and behavior is normal. Judgment and thought content normal. Cognition and memory are normal.  Nursing note and vitals reviewed.         Assessment & Plan:  F/u in 3-4 months

## 2015-05-30 NOTE — Assessment & Plan Note (Signed)
Lipid Panel     Component Value Date/Time   CHOL 288* 05/26/2015 1607   TRIG 262* 05/26/2015 1607   TRIG 390 10/22/2010   HDL 31* 05/26/2015 1607   CHOLHDL 9.3 05/26/2015 1607   VLDL 52* 05/26/2015 1607   LDLCALC 205* 05/26/2015 1607   Pt noncompliant with Pravachol 40 mg qhs x 4 months  ASCVD score calculated 57.1 He is on Pravachol 40 mg will continue for now but consider high intensity statin in the future  Pt is exercising daily as well via walking

## 2015-05-30 NOTE — Assessment & Plan Note (Signed)
No longer present though was yesterday. Unclear etiology  Advised if returns and severe to come back to clinic Will check CMET today, no sx's of UTI so will not check UA

## 2015-05-30 NOTE — Assessment & Plan Note (Signed)
Counseled on smoking cessation Offered low dose CT scan but pt declined

## 2015-05-30 NOTE — Assessment & Plan Note (Signed)
Declined low dose CT scan Referred back to [facility] DM foot exam today due 07/2015

## 2015-05-31 NOTE — Progress Notes (Signed)
Internal Medicine Clinic Attending  Case discussed with Dr. McLean soon after the resident saw the patient.  We reviewed the resident's history and exam and pertinent patient test results.  I agree with the assessment, diagnosis, and plan of care documented in the resident's note. 

## 2015-08-10 LAB — HM DIABETES EYE EXAM

## 2015-10-06 ENCOUNTER — Encounter: Payer: Self-pay | Admitting: *Deleted

## 2015-12-01 ENCOUNTER — Ambulatory Visit (INDEPENDENT_AMBULATORY_CARE_PROVIDER_SITE_OTHER): Payer: Medicare HMO | Admitting: Internal Medicine

## 2015-12-01 ENCOUNTER — Encounter: Payer: Medicare HMO | Admitting: Internal Medicine

## 2015-12-01 ENCOUNTER — Encounter: Payer: Self-pay | Admitting: Internal Medicine

## 2015-12-01 VITALS — BP 140/80 | HR 64 | Temp 97.6°F | Ht 71.0 in | Wt 229.6 lb

## 2015-12-01 DIAGNOSIS — Z7984 Long term (current) use of oral hypoglycemic drugs: Secondary | ICD-10-CM | POA: Diagnosis not present

## 2015-12-01 DIAGNOSIS — I10 Essential (primary) hypertension: Secondary | ICD-10-CM

## 2015-12-01 DIAGNOSIS — Z79899 Other long term (current) drug therapy: Secondary | ICD-10-CM

## 2015-12-01 DIAGNOSIS — E785 Hyperlipidemia, unspecified: Secondary | ICD-10-CM

## 2015-12-01 DIAGNOSIS — F17210 Nicotine dependence, cigarettes, uncomplicated: Secondary | ICD-10-CM

## 2015-12-01 DIAGNOSIS — Z720 Tobacco use: Secondary | ICD-10-CM

## 2015-12-01 DIAGNOSIS — J309 Allergic rhinitis, unspecified: Secondary | ICD-10-CM

## 2015-12-01 DIAGNOSIS — E119 Type 2 diabetes mellitus without complications: Secondary | ICD-10-CM | POA: Diagnosis not present

## 2015-12-01 LAB — POCT GLYCOSYLATED HEMOGLOBIN (HGB A1C): Hemoglobin A1C: 6.9

## 2015-12-01 LAB — GLUCOSE, CAPILLARY: Glucose-Capillary: 135 mg/dL — ABNORMAL HIGH (ref 65–99)

## 2015-12-01 MED ORDER — DESLORATADINE 5 MG PO TABS: 5.0000 mg | ORAL_TABLET | Freq: Every day | ORAL | Status: DC

## 2015-12-01 MED ORDER — ATORVASTATIN CALCIUM 80 MG PO TABS: 80.0000 mg | ORAL_TABLET | Freq: Every day | ORAL | Status: DC

## 2015-12-01 MED ORDER — LISINOPRIL 10 MG PO TABS: 10.0000 mg | ORAL_TABLET | Freq: Every day | ORAL | Status: DC

## 2015-12-01 NOTE — Assessment & Plan Note (Signed)
Assessment: Patient reports not taking his Clarinex 5mg  daily for seasonal allergies and states needs a refill.  Plan: - refill sent for Clarinex - recommended nasal saline as needed as well

## 2015-12-01 NOTE — Progress Notes (Signed)
Patient ID: Dawson Tweed, male   DOB: [birth year], 64 y.o.   MRN: [id number]   Subjective:   Patient ID: Maxxon Kukura male   DOB: Feb 23, 1951 64 y.o.   MRN: [id number]  HPI: Mr.Alain Loebs is a 63 y.o. male with past medical history of DM2, HTN, GERD, HLD, tobacco abuse, seasonal allergies who presents to [facility] today for follow up of HLD, DM, HTN.  Please see problem list for status of patients chronic medical conditions addressed at today's visit.    Past Medical History  Diagnosis Date  . Diabetes mellitus   . Hyperlipidemia   . GERD (gastroesophageal reflux disease)   . Hypertension   . Arthritis   . Allergy    Current Outpatient Prescriptions  Medication Sig Dispense Refill  . aspirin EC 81 MG tablet Take 1 tablet (81 mg total) by mouth daily. 90 tablet 1  . lisinopril (PRINIVIL,ZESTRIL) 10 MG tablet Take 2 tablets (20 mg total) by mouth daily. 90 tablet 1  . metFORMIN (GLUCOPHAGE) 500 MG tablet Take 1 tablet (500 mg total) by mouth daily with breakfast. 90 tablet 1  . sodium chloride (OCEAN) 0.65 % SOLN nasal spray Place 1 spray into both nostrils as needed for congestion. 60 mL 0  . atorvastatin (LIPITOR) 80 MG tablet Take 1 tablet (80 mg total) by mouth daily. 30 tablet 0  . desloratadine (CLARINEX) 5 MG tablet Take 1 tablet (5 mg total) by mouth daily. 30 tablet 1   No current facility-administered medications for this visit.   Family History  Problem Relation Age of Onset  . Colon cancer Father     Died at age 95 due to it.  Marland Kitchen Heart disease Mother   . Gout Mother   . Diabetes Sister   . Lung cancer Paternal Uncle   . Cancer Paternal Aunt     Unknown cancer   Social History   Social History  . Marital Status: Married    Spouse Name: N/A  . Number of Children: N/A  . Years of Education: N/A   Social History Main Topics  . Smoking status: Current Every Day Smoker -- 0.50 packs/day    Types: Cigarettes  . Smokeless tobacco: None     Comment: cutting back still  smoking 4.5 packs per week  . Alcohol Use: No  . Drug Use: No  . Sexual Activity: Not Asked   Other Topics Concern  . None   Social History Narrative   Lives in [place] with his wife.   Retired from job earlier  in 2012.   Has no kids.       Does not have any insurance at present - 11/23/2011.    Review of Systems: Review of Systems  Constitutional: Negative for fever, chills and weight loss.  Eyes: Negative for blurred vision.  Respiratory: Positive for shortness of breath. Negative for cough.   Cardiovascular: Negative for chest pain.  Gastrointestinal: Negative for nausea, vomiting, abdominal pain, diarrhea and constipation.  Genitourinary: Negative for dysuria.  Musculoskeletal: Positive for myalgias. Negative for back pain and falls.  Skin: Negative for rash.  Neurological: Negative for dizziness, loss of consciousness and headaches.  Psychiatric/Behavioral: Negative for depression.    Objective:  Physical Exam: Filed Vitals:   12/01/15 1003 12/01/15 1017  BP: 151/82 140/80  Pulse: 64 64  Temp: 97.6 F (36.4 C)   TempSrc: Oral   Height: 5\' 11"  (1.803 m)   Weight: 229 lb 9.6 oz (104.146 kg)   SpO2: 100%  Physical Exam  Constitutional: He is oriented to person, place, and time. He appears well-developed and well-nourished. No distress.  HENT:  Head: Normocephalic and atraumatic.  Eyes: EOM are normal.  Neck: Normal range of motion.  Cardiovascular: Normal rate and regular rhythm.   Pulmonary/Chest: Effort normal and breath sounds normal.  Musculoskeletal: Normal range of motion. He exhibits no edema.  Neurological: He is alert and oriented to person, place, and time.  Skin: Skin is warm and dry.  Psychiatric: He has a normal mood and affect.    Assessment & Plan:  Please see problem list for assessment and plan.  Case discussed with Dr. Daryll Drown.

## 2015-12-01 NOTE — Assessment & Plan Note (Signed)
  Assessment: Progress toward smoking cessation:   smoking less, down to 4 packs per week Barriers to progress toward smoking cessation:   duration of smoking history Comments: none. Not ready to quit   Plan: Instruction/counseling given:  I counseled patient on the dangers of tobacco use, advised patient to stop smoking, and reviewed strategies to maximize success. Educational resources provided:   none  Self management tools provided:   none Medications to assist with smoking cessation:  None Other plans: keep reassessing for cessation

## 2015-12-01 NOTE — Assessment & Plan Note (Signed)
BP Readings from Last 3 Encounters:  12/01/15 140/80  05/26/15 134/80  07/08/14 118/68       Component Value Date/Time   NA 137 05/26/2015 1607   K 4.4 05/26/2015 1607   CREATININE 0.91 05/26/2015 1607   CREATININE 1.0 10/22/2010    Assessment:  Patient with HTN compliant with 1 class (ACE inhibitor) anti-hypertensive therapy who presents today with blood pressure of 151/82 initially and then 140/80 on recheck.  Denies headaches, chest pain, edema, dizziness or lightheadedness  Blood pressure control: at goal on recheck but closer to 130/90 may be more appropriate in the diabetic patient Comments: patient reports good compliance with his BP medications as previously he had not been.  Last office visit in June, he had not been taking his Lisinopril 20mg  daily x 4 months.  Dose at that time was decreased to 10mg  daily, which patient has been following.  Plan:   -Medication changes: continue Lisinopril 10mg  daily -Labs: BMET today -Other plans: patients med list states to be on 20mg  Lisinopril, however, patient has been on 10mg  daily since June based on last office visit notes.  Initial plan was to increase Lisinopril to 20 mg, however, will continue at 10mg  daily.  I called patient and left message with his wife that patient to continue as he has been on 10mg  daily for Lisinopril. -Encourage home BP monitoring 3 times per week or at drug store occasionally  -Encourage regular exercise and healthy diet (decreased salt intake)  Medications after today's visit: 1. Lisinopril 10mg  daily

## 2015-12-01 NOTE — Assessment & Plan Note (Addendum)
Lab Results  Component Value Date   HGBA1C 6.9 12/01/2015   HGBA1C 8.9 10/22/2010    Assessment:  Patient with last A1C 7.2 in June 2016 who presents with CBG of 135.  Patient reports good compliance with medications.  Denies any recent symptomatic hypoglycemia or hyperglycemia.  Diabetes control: controlled, at goal   Progress toward Hgb A1c goal: improved.  A1C 6.9 today Home glucose monitoring: Yes.  Average CBGs according to meter are not obtained as patient did not bring meter  Frequency: once daily  Timing: before meals Comments: patient has been taking his metformin every day without complications  Plan:  -Medication changes: none, continue Metformin 500mg  daily with breakfast  -Next Hgb A1c due March 2017 -BP 140/80, continue Lisinopril -Last annual eye exam: August 2016, no retinopathy -Last annual foot exam: June 2016 -Last annual urine microalbumin/creatine ratio: June 2016 with ratio of 72.  Repeat today now that he has been compliant with Lisinopril. -Continue Aspirin 81mg  daily for secondary CVD prevention -Encourage regular exercise and healthy diet  Medications after today's visit: 1. Metformin 500mg  daily with breakfast  ADDENDUM 12/02/15 03:40am: urine microalbumin/creatine ratio increased from previous to 124.  Will increase Lisinopril to 20mg  daily

## 2015-12-01 NOTE — Assessment & Plan Note (Addendum)
Pertinent Labs: Liver Function Tests:    Component Value Date/Time   AST 18 05/26/2015 1607   ALT 15 05/26/2015 1607   ALKPHOS 51 05/26/2015 1607   BILITOT 0.4 05/26/2015 1607   PROT 7.1 05/26/2015 1607   ALBUMIN 4.6 05/26/2015 1607    Lipid Panel:     Component Value Date/Time   CHOL 288* 05/26/2015 1607   TRIG 262* 05/26/2015 1607   TRIG 390 10/22/2010   HDL 31* 05/26/2015 1607   CHOLHDL 9.3 05/26/2015 1607   VLDL 52* 05/26/2015 1607   LDLCALC 205* 05/26/2015 1607    Assessment:  Patient with hyperlipidemia not compliant with Pravastatin 40mg  daily.  Last lipid panel was June 2016 with TC 288, TG 262, HDL 31, LDL 205.  10-Year ASCVD Calculated Risk is 49%.  Recommendation is high intensity statin.  Comments: patient reports only taking Pravastatin 2-3 times per week due to complaints of muscle aches.  Plan:   -Medications: discontinue Pravastatin.  Start Atorvastatin 80mg  daily.  If patient unable to tolerate can titrate down to 40mg .  -Labs: none -Encourage lifestyle modifications to lower cardiovascular disease risk.  This includes eating a heart-healthy diet, regular aerobic exercise, maintenance of desirable body weight, and avoidance of tobacco products

## 2015-12-01 NOTE — Patient Instructions (Signed)
Thank you for coming to see me today. It was a pleasure. Today we talked about:   Cholesterol:  - STOP taking Pravastatin 40mg  daily.  -START taking Atorvastatin (Lipitor) 80mg  daily.  I have sent a 30 day supply to your pharmacy.  If you tolerate this medication, we will continue it.  If you have side effects, we will lower the dose.  Please call us if you are not tolerating this medication.  High Blood Pressure:  - START taking 2 pills of your Lisinopril so your total dose is 20mg  per day.  Diabetes:  - You are doing a great job, keep up the good work  Smoking:  -Keep trying to cut back. We are here to support you.  Please follow-up with me in 6 months.  If you have any questions or concerns, please do not hesitate to call the office at [phone].  Take Care,   Jule Ser, DO

## 2015-12-02 ENCOUNTER — Telehealth: Payer: Self-pay | Admitting: Internal Medicine

## 2015-12-02 LAB — BMP8+ANION GAP
Anion Gap: 16 mmol/L (ref 10.0–18.0)
BUN / CREAT RATIO: 12 (ref 10–22)
BUN: 10 mg/dL (ref 8–27)
CHLORIDE: 99 mmol/L (ref 96–106)
CO2: 23 mmol/L (ref 18–29)
Calcium: 9.8 mg/dL (ref 8.6–10.2)
Creatinine, Ser: 0.85 mg/dL (ref 0.76–1.27)
GFR calc non Af Amer: 91 mL/min/{1.73_m2} (ref >59)
GFR, EST AFRICAN AMERICAN: 105 mL/min/{1.73_m2} (ref >59)
GLUCOSE: 124 mg/dL — AB (ref 65–99)
POTASSIUM: 4.7 mmol/L (ref 3.5–5.2)
Sodium: 138 mmol/L (ref 134–144)

## 2015-12-02 LAB — MICROALBUMIN / CREATININE URINE RATIO
CREATININE, UR: 185.8 mg/dL
MICROALB/CREAT RATIO: 124.8 mg/g creat — ABNORMAL HIGH (ref 0.0–30.0)
Microalbumin, Urine: 231.9 ug/mL

## 2015-12-02 MED ORDER — LISINOPRIL 10 MG PO TABS: 20.0000 mg | ORAL_TABLET | Freq: Every day | ORAL | Status: DC

## 2015-12-02 NOTE — Addendum Note (Signed)
Addended by: Mignon Pine on: 12/02/2015 03:42 PM   Modules accepted: Orders

## 2015-12-02 NOTE — Telephone Encounter (Signed)
Outgoing call placed to patients contact phone number to discuss recent lab work, in particular, increased microalbumin/creatine ratio.  I instructed patient to increase Lisinopril to 20mg  daily as we had discussed during his office visit.  Jule Ser, DO 12/02/2015, 3:43 PM PGY-1, [facility] Pager: [phone]

## 2015-12-07 NOTE — Progress Notes (Signed)
Internal Medicine Clinic Attending  I saw and evaluated the patient.  I personally confirmed the key portions of the history and exam documented by Dr. Wallace and I reviewed pertinent patient test results.  The assessment, diagnosis, and plan were formulated together and I agree with the documentation in the resident's note. 

## 2016-02-17 ENCOUNTER — Other Ambulatory Visit: Payer: Self-pay | Admitting: *Deleted

## 2016-02-17 DIAGNOSIS — I10 Essential (primary) hypertension: Secondary | ICD-10-CM

## 2016-02-17 MED ORDER — METFORMIN HCL 500 MG PO TABS: 500.0000 mg | ORAL_TABLET | Freq: Every day | ORAL | Status: DC

## 2016-02-17 MED ORDER — TRUE METRIX AIR GLUCOSE METER DEVI: 1.0000 | Freq: Every day | Status: DC

## 2016-02-17 MED ORDER — TRUEPLUS LANCETS 28G MISC: Status: DC

## 2016-02-17 MED ORDER — LISINOPRIL 10 MG PO TABS: 20.0000 mg | ORAL_TABLET | Freq: Every day | ORAL | Status: DC

## 2016-02-17 MED ORDER — ATORVASTATIN CALCIUM 80 MG PO TABS: 80.0000 mg | ORAL_TABLET | Freq: Every day | ORAL | Status: DC

## 2016-02-17 MED ORDER — GLUCOSE BLOOD VI STRP: ORAL_STRIP | Status: DC

## 2016-02-17 NOTE — Telephone Encounter (Signed)
Last appt 12/01/15.   Next appt 06/07/16.

## 2016-06-07 ENCOUNTER — Ambulatory Visit (INDEPENDENT_AMBULATORY_CARE_PROVIDER_SITE_OTHER): Payer: Commercial Managed Care - HMO | Admitting: Internal Medicine

## 2016-06-07 VITALS — BP 143/90 | HR 85 | Temp 98.1°F | Ht 71.0 in | Wt 228.0 lb

## 2016-06-07 DIAGNOSIS — E785 Hyperlipidemia, unspecified: Secondary | ICD-10-CM

## 2016-06-07 DIAGNOSIS — Z7984 Long term (current) use of oral hypoglycemic drugs: Secondary | ICD-10-CM

## 2016-06-07 DIAGNOSIS — E559 Vitamin D deficiency, unspecified: Secondary | ICD-10-CM

## 2016-06-07 DIAGNOSIS — E119 Type 2 diabetes mellitus without complications: Secondary | ICD-10-CM

## 2016-06-07 DIAGNOSIS — I10 Essential (primary) hypertension: Secondary | ICD-10-CM | POA: Diagnosis not present

## 2016-06-07 DIAGNOSIS — M79672 Pain in left foot: Secondary | ICD-10-CM | POA: Diagnosis not present

## 2016-06-07 DIAGNOSIS — M722 Plantar fascial fibromatosis: Secondary | ICD-10-CM

## 2016-06-07 LAB — POCT GLYCOSYLATED HEMOGLOBIN (HGB A1C): Hemoglobin A1C: 7.6

## 2016-06-07 LAB — GLUCOSE, CAPILLARY: Glucose-Capillary: 160 mg/dL — ABNORMAL HIGH (ref 65–99)

## 2016-06-07 MED ORDER — METFORMIN HCL 500 MG PO TABS
1000.0000 mg | ORAL_TABLET | Freq: Two times a day (BID) | ORAL | Status: DC
Start: 2016-06-07 — End: 2016-10-15

## 2016-06-07 MED ORDER — PRAVASTATIN SODIUM 40 MG PO TABS: 40.0000 mg | ORAL_TABLET | Freq: Every evening | ORAL | Status: DC

## 2016-06-07 NOTE — Patient Instructions (Signed)
Thank you for coming to see me today. It was a pleasure. Today we talked about:   Blood Pressure: your BP is a little high today due to stress but in the past has been better controlled.  Keep a BP log at home 3-4 times per week and bring to your next visit.  Diabetes: your A1c which monitors your diabetes is a little higher this time so we need to increase your Metformin.  Over the next couple weeks, increase to 1000mg  twice per day.  I sent a new prescription to your pharmacy.  High Cholesterol: we will check a lipid panel today.  Stop taking Atorvastatin and resume taking Pravastatin daily.  We will check a couple other labs to make sure this is not the cause of your muscle pain.  In the future we may need to add another medication called Zetia.  Heel Pain: I gave you some stretching exercises and sent in a referral to physical therapy.  You can also rest and use ice as needed.  Please follow-up with me in 3 months.  If you have any questions or concerns, please do not hesitate to call the office at [phone].  Take Care,   Jule Ser, DO

## 2016-06-07 NOTE — Progress Notes (Signed)
Patient ID: Carlos Bowman, male   DOB: 1951/01/22, 65 y.o.   MRN: [id number]   CC: left heel pain, follow up of DM, HLD HPI: Carlos Bowman is a 65 y.o. man with PMHx as detailed below presenting for follow up of his DM and HLD.  He also has an acute complaint of left heel pain.  Please see A&P for status of medical conditions addressed today.    Past Medical History  Diagnosis Date  . Diabetes mellitus   . Hyperlipidemia   . GERD (gastroesophageal reflux disease)   . Hypertension   . Arthritis   . Allergy   . Vitamin D deficiency    Review of Systems: Review of Systems  Constitutional: Negative for fever and chills.  Respiratory: Negative for shortness of breath.   Cardiovascular: Negative for chest pain.  Musculoskeletal: Positive for myalgias.       Left heel pain    Physical Exam: Filed Vitals:   06/07/16 1345  BP: 143/90  Pulse: 85  Temp: 98.1 F (36.7 C)  TempSrc: Oral  Height: 5\' 11"  (1.803 m)  Weight: 228 lb (103.42 kg)  SpO2: 99%   Physical Exam  Constitutional: He is oriented to person, place, and time and well-developed, well-nourished, and in no distress.  HENT:  Head: Normocephalic and atraumatic.  Cardiovascular: Normal rate and regular rhythm.   Pulmonary/Chest: Effort normal.  Musculoskeletal:  Mild point tenderness at base of left heel  Neurological: He is alert and oriented to person, place, and time.  Skin: Skin is warm and dry. No erythema.     Assessment & Plan:  See encounters tab for problem based medical decision making. Patient discussed with Dr. Daryll Drown  Hypertension BP Readings from Last 3 Encounters:  06/07/16 143/90  12/01/15 140/80  05/26/15 134/80   Assessment: Patient compliant with 1 class BP regimen of Lisinopril 20mg  daily.  His BP is not at goal of around 130/90 given his HLD and DM.  He reports today has been a stressful day for him and he his hesitant about starting another medication.  Plan: - encouraged to keep a  BP log at home - low salt diet, adequate exercise - continue Lisinopril 20mg  daily - RTC 3 months, if not at goal we discussed adding another BP medication such as HCTZ  Diabetes type 2, controlled Lab Results  Component Value Date   HGBA1C 7.6 06/07/2016   Assessment: His A1c has deteriorated from 6.9 last December to 7.6 today.  He is taking metformin 500mg  daily.  He reports he eats too many sweets and sugary drinks.  Plan: - titrate up his metformin to 1000mg  BID - repeat A1c 3 months - encouraged better diet and exercise - foot exam done today - next eye exam due around August - on ACE - no need to check urine microalbumin/creatine  Dyslipidemia Lipid Panel     Component Value Date/Time   CHOL 251* 06/07/2016 1519   CHOL 288* 05/26/2015 1607   TRIG 233* 06/07/2016 1519   TRIG 390 10/22/2010   HDL 36* 06/07/2016 1519   HDL 31* 05/26/2015 1607   CHOLHDL 7.0* 06/07/2016 1519   CHOLHDL 9.3 05/26/2015 1607   VLDL 52* 05/26/2015 1607   LDLCALC 168* 06/07/2016 1519   LDLCALC 205* 05/26/2015 1607   Assessment:  Last visit, we changed his statin to a high intensity statin due to his ASCVD risk score.  He reports that he only took this a couple times per week due to muscle  pain and has not been taking recently.  In December, he reported that he had similar problems while on Pravastatin but not as bad.  Plan: - repeat lipid panel shows LDL 168, HDL 36, and TC 251 - discontinue atorvastatin since he is not taking and attributes this to his myalgias - resume pravastatin 40mg  daily - look for alternate causes of myalgias by checking TSH and Vitamin D - TSH was mildly low at 0.406.  Subsequent free T3 and T4 were normal suggesting subclinical hyperthyroidism - vitamin D was low at 16.7 - will attempt to correct vitamin D deficiency and see if this enables him to tolerate daily statin dosing - if correcting vitamin D enables him to take pravastatin daily, will repeat a lipid  panel at follow up and assess LDL response.  If inadequate, will likely need to add Zetia 10mg  daily or discuss returning to high-intensity statin - if correcting vitamin D still only enables him to take pravastatin every other day, will add Zetia  Plantar fasciitis of left foot Assessment: Patient reports pain at base of his left heel described as sharp and pinpoint.  Pain worst first thing in the morning when getting out of bed or after sitting for a long time.  Eases off after being able to walk around a little bit.  Plan: - clinically suggestive of plantar fasciitis - recommended stretching exercises, strengthening exercises, ice and rest - can also do short term trial of anti-inflammatories - referral to outpatient PT - RTC in 3 months    Vitamin D deficiency Assessment: In attempt to look for other causes of statin-induced myalgia, vitamin D was found to be deficient at 16.7.  Plan:  - supplement with cholecalciferol 1000units daily - repeat vitamin D level in 3 months

## 2016-06-08 LAB — LIPID PANEL
CHOL/HDL RATIO: 7 ratio — AB (ref 0.0–5.0)
Cholesterol, Total: 251 mg/dL — ABNORMAL HIGH (ref 100–199)
HDL: 36 mg/dL — AB (ref >39)
LDL Calculated: 168 mg/dL — ABNORMAL HIGH (ref 0–99)
TRIGLYCERIDES: 233 mg/dL — AB (ref 0–149)
VLDL Cholesterol Cal: 47 mg/dL — ABNORMAL HIGH (ref 5–40)

## 2016-06-08 LAB — BMP8+ANION GAP
ANION GAP: 18 mmol/L (ref 10.0–18.0)
BUN/Creatinine Ratio: 11 (ref 10–24)
BUN: 10 mg/dL (ref 8–27)
CALCIUM: 10.1 mg/dL (ref 8.6–10.2)
CHLORIDE: 99 mmol/L (ref 96–106)
CO2: 21 mmol/L (ref 18–29)
Creatinine, Ser: 0.9 mg/dL (ref 0.76–1.27)
GFR calc Af Amer: 102 mL/min/{1.73_m2} (ref >59)
GFR calc non Af Amer: 88 mL/min/{1.73_m2} (ref >59)
GLUCOSE: 137 mg/dL — AB (ref 65–99)
POTASSIUM: 4.5 mmol/L (ref 3.5–5.2)
SODIUM: 138 mmol/L (ref 134–144)

## 2016-06-08 LAB — VITAMIN D 25 HYDROXY (VIT D DEFICIENCY, FRACTURES): Vit D, 25-Hydroxy: 16.7 ng/mL — ABNORMAL LOW (ref 30.0–100.0)

## 2016-06-08 LAB — TSH: TSH: 0.406 u[IU]/mL — ABNORMAL LOW (ref 0.450–4.500)

## 2016-06-10 ENCOUNTER — Encounter: Payer: Self-pay | Admitting: Internal Medicine

## 2016-06-10 DIAGNOSIS — M722 Plantar fascial fibromatosis: Secondary | ICD-10-CM | POA: Insufficient documentation

## 2016-06-10 DIAGNOSIS — E559 Vitamin D deficiency, unspecified: Secondary | ICD-10-CM | POA: Insufficient documentation

## 2016-06-10 NOTE — Assessment & Plan Note (Signed)
Lipid Panel     Component Value Date/Time   CHOL 251* 06/07/2016 1519   CHOL 288* 05/26/2015 1607   TRIG 233* 06/07/2016 1519   TRIG 390 10/22/2010   HDL 36* 06/07/2016 1519   HDL 31* 05/26/2015 1607   CHOLHDL 7.0* 06/07/2016 1519   CHOLHDL 9.3 05/26/2015 1607   VLDL 52* 05/26/2015 1607   LDLCALC 168* 06/07/2016 1519   LDLCALC 205* 05/26/2015 1607   Assessment:  Last visit, we changed his statin to a high intensity statin due to his ASCVD risk score.  He reports that he only took this a couple times per week due to muscle pain and has not been taking recently.  In December, he reported that he had similar problems while on Pravastatin but not as bad.  Plan: - repeat lipid panel shows LDL 168, HDL 36, and TC 251 - discontinue atorvastatin since he is not taking and attributes this to his myalgias - resume pravastatin 40mg  daily - look for alternate causes of myalgias by checking TSH and Vitamin D - TSH was mildly low at 0.406.  Subsequent free T3 and T4 were normal suggesting subclinical hyperthyroidism - vitamin D was low at 16.7 - will attempt to correct vitamin D deficiency and see if this enables him to tolerate daily statin dosing - if correcting vitamin D enables him to take pravastatin daily, will repeat a lipid panel at follow up and assess LDL response.  If inadequate, will likely need to add Zetia 10mg  daily or discuss returning to high-intensity statin - if correcting vitamin D still only enables him to take pravastatin every other day, will add Zetia

## 2016-06-10 NOTE — Assessment & Plan Note (Signed)
Assessment: Patient reports pain at base of his left heel described as sharp and pinpoint.  Pain worst first thing in the morning when getting out of bed or after sitting for a long time.  Eases off after being able to walk around a little bit.  Plan: - clinically suggestive of plantar fasciitis - recommended stretching exercises, strengthening exercises, ice and rest - can also do short term trial of anti-inflammatories - referral to outpatient PT - RTC in 3 months

## 2016-06-10 NOTE — Assessment & Plan Note (Signed)
Lab Results  Component Value Date   HGBA1C 7.6 06/07/2016   Assessment: His A1c has deteriorated from 6.9 last December to 7.6 today.  He is taking metformin 500mg  daily.  He reports he eats too many sweets and sugary drinks.  Plan: - titrate up his metformin to 1000mg  BID - repeat A1c 3 months - encouraged better diet and exercise - foot exam done today - next eye exam due around August - on ACE - no need to check urine microalbumin/creatine

## 2016-06-10 NOTE — Assessment & Plan Note (Signed)
BP Readings from Last 3 Encounters:  06/07/16 143/90  12/01/15 140/80  05/26/15 134/80   Assessment: Patient compliant with 1 class BP regimen of Lisinopril 20mg  daily.  His BP is not at goal of around 130/90 given his HLD and DM.  He reports today has been a stressful day for him and he his hesitant about starting another medication.  Plan: - encouraged to keep a BP log at home - low salt diet, adequate exercise - continue Lisinopril 20mg  daily - RTC 3 months, if not at goal we discussed adding another BP medication such as HCTZ

## 2016-06-10 NOTE — Assessment & Plan Note (Signed)
Assessment: In attempt to look for other causes of statin-induced myalgia, vitamin D was found to be deficient at 16.7.  Plan:  - supplement with cholecalciferol 1000units daily - repeat vitamin D level in 3 months

## 2016-06-11 ENCOUNTER — Other Ambulatory Visit: Payer: Self-pay | Admitting: Internal Medicine

## 2016-06-11 DIAGNOSIS — E559 Vitamin D deficiency, unspecified: Secondary | ICD-10-CM

## 2016-06-11 MED ORDER — CHOLECALCIFEROL 25 MCG (1000 UT) PO TABS: 1000.0000 [IU] | ORAL_TABLET | Freq: Every day | ORAL | Status: DC

## 2016-06-11 NOTE — Progress Notes (Signed)
Internal Medicine Clinic Attending  Case discussed with Dr. Wallace at the time of the visit.  We reviewed the resident's history and exam and pertinent patient test results.  I agree with the assessment, diagnosis, and plan of care documented in the resident's note.  

## 2016-06-13 LAB — T3: T3 TOTAL: 125 ng/dL (ref 71–180)

## 2016-06-13 LAB — SPECIMEN STATUS REPORT

## 2016-06-13 LAB — T4, FREE: FREE T4: 1.14 ng/dL (ref 0.82–1.77)

## 2016-06-21 ENCOUNTER — Encounter: Payer: Self-pay | Admitting: *Deleted

## 2016-06-28 ENCOUNTER — Other Ambulatory Visit: Payer: Self-pay | Admitting: Internal Medicine

## 2016-07-24 NOTE — Addendum Note (Signed)
Addended by: Hulan Fray on: 07/24/2016 09:56 PM   Modules accepted: Orders

## 2016-08-20 ENCOUNTER — Encounter: Payer: Self-pay | Admitting: *Deleted

## 2016-09-06 ENCOUNTER — Encounter: Payer: Commercial Managed Care - HMO | Admitting: Internal Medicine

## 2016-09-13 ENCOUNTER — Other Ambulatory Visit: Payer: Self-pay | Admitting: Internal Medicine

## 2016-09-13 ENCOUNTER — Encounter: Payer: Commercial Managed Care - HMO | Admitting: Internal Medicine

## 2016-09-13 DIAGNOSIS — E559 Vitamin D deficiency, unspecified: Secondary | ICD-10-CM

## 2016-09-20 ENCOUNTER — Encounter: Payer: Commercial Managed Care - HMO | Admitting: Internal Medicine

## 2016-10-15 ENCOUNTER — Ambulatory Visit (INDEPENDENT_AMBULATORY_CARE_PROVIDER_SITE_OTHER): Payer: Commercial Managed Care - HMO | Admitting: Student in an Organized Health Care Education/Training Program

## 2016-10-15 VITALS — BP 138/78 | HR 62 | Temp 97.7°F | Ht 71.0 in | Wt 224.2 lb

## 2016-10-15 DIAGNOSIS — E119 Type 2 diabetes mellitus without complications: Secondary | ICD-10-CM | POA: Diagnosis not present

## 2016-10-15 DIAGNOSIS — Z7982 Long term (current) use of aspirin: Secondary | ICD-10-CM

## 2016-10-15 DIAGNOSIS — Z720 Tobacco use: Secondary | ICD-10-CM

## 2016-10-15 DIAGNOSIS — Z7984 Long term (current) use of oral hypoglycemic drugs: Secondary | ICD-10-CM | POA: Diagnosis not present

## 2016-10-15 DIAGNOSIS — E785 Hyperlipidemia, unspecified: Secondary | ICD-10-CM

## 2016-10-15 DIAGNOSIS — Z0000 Encounter for general adult medical examination without abnormal findings: Secondary | ICD-10-CM

## 2016-10-15 DIAGNOSIS — F17210 Nicotine dependence, cigarettes, uncomplicated: Secondary | ICD-10-CM

## 2016-10-15 DIAGNOSIS — I10 Essential (primary) hypertension: Secondary | ICD-10-CM

## 2016-10-15 DIAGNOSIS — Z79899 Other long term (current) drug therapy: Secondary | ICD-10-CM

## 2016-10-15 LAB — GLUCOSE, CAPILLARY: GLUCOSE-CAPILLARY: 137 mg/dL — AB (ref 65–99)

## 2016-10-15 LAB — POCT GLYCOSYLATED HEMOGLOBIN (HGB A1C): Hemoglobin A1C: 7.2

## 2016-10-15 MED ORDER — METFORMIN HCL 500 MG PO TABS: ORAL_TABLET | ORAL | 3 refills | Status: DC

## 2016-10-15 MED ORDER — LISINOPRIL 20 MG PO TABS: 20.0000 mg | ORAL_TABLET | Freq: Every day | ORAL | 3 refills | Status: DC

## 2016-10-15 MED ORDER — PRAVASTATIN SODIUM 40 MG PO TABS: 40.0000 mg | ORAL_TABLET | Freq: Every evening | ORAL | 3 refills | Status: DC

## 2016-10-15 NOTE — Assessment & Plan Note (Signed)
Smoking for the last 50 years, currently 15 cigarettes per day. He is pre-contemplative about quitting, has tried Chantix in the past. I spent greater than 5 minutes counseling on the importance of tobacco cessation. I offered him nicotine replacement therapy or bupropion in the future if he wants to try another quit attempts. No signs so far of COPD.

## 2016-10-15 NOTE — Patient Instructions (Signed)
1. Keep taking your Metformin as you are: 1 pill in the morning, 2 in the evening.  2. Take all your other medications as prescribed.   3. You may stop checking your bleed sugars at home. We will check your A1c here in the clinic every three months.   4. Call me if you are interested in medicines to stop smoking.

## 2016-10-15 NOTE — Assessment & Plan Note (Signed)
Colonoscopy reportedly completed in 2009 and next due in 2019. He is not a candidate for immunizations because of an intense fear of needles.

## 2016-10-15 NOTE — Assessment & Plan Note (Addendum)
Hemoglobin A1c of 7.2% today. Doing well on metformin, though it does cause GI upset in the morning. As a result he is unable to take a full 1000 milligrams in the morning. We are going to continue current metformin dosing of 500 mg in the morning and 1000 mg in the evening. Recheck A1c in 3 months. We will continue aspirin and pravastatin for primary prevention of ischemic vascular disease.

## 2016-10-15 NOTE — Assessment & Plan Note (Signed)
Blood pressure is well-controlled today. Plan to continue lisinopril 20 mg daily. Recheck BMP in June 2018.

## 2016-10-15 NOTE — Progress Notes (Signed)
Assessment and Plan:  See Encounters tab for problem-based medical decision making.   __________________________________________________________  HPI:  65 year old man here for follow up of diabetes. He has been seeing Dr. Juleen China at [facility] but is switching to my panel because of scheduling convenience. Patient has had diabetes for about 5 years historically well managed with metformin only. At his last visit he was told to increase metformin to 1000 mg twice a day. He difficulty increasing his a.m. dose because of upset stomach. As a result he is only taking 500 mg in the morning and 1000 mg in the evening for the last 1 week. Denies any hypoglycemia. He checks his blood sugars at home about twice weekly. No recent hospitalizations. He walks to exercise and denies dyspnea with exertion or angina. He lives at home with his wife. 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 He reports having a significant amount of stress in his life, says that he is constantly worried about members of his congregation who bring a lot of problems to him. He continues to smoke about 15 cigarettes per day. He says that the stress in his life is the biggest limiting factor to quitting smoking. He does endorse some morning sputum production. Denies any episodes of pneumonia or prior diagnoses of COPD or prior use of inhaled medications.  __________________________________________________________  Problem List: Patient Active Problem List   Diagnosis Date Noted  . Tobacco abuse 12/31/2013    Priority: High  . Diabetes type 2, controlled ([place]) 11/08/2010    Priority: High  . Hypertension 11/23/2011    Priority: Medium  . Dyslipidemia 11/08/2010    Priority: Medium  . Health care maintenance 12/31/2013    Priority: Low    Medications: Reconciled today in  Epic __________________________________________________________  Physical Exam:  Vital Signs: Vitals:   10/15/16 0820  BP: 138/78  Pulse: 62  Temp: 97.7 F (36.5 C)  TempSrc: Oral  SpO2: 98%  Weight: 224 lb 3.2 oz (101.7 kg)  Height: 5\' 11"  (1.803 m)    Gen: Well appearing, NAD ENT: OP clear without erythema or exudate.  Neck: No cervical LAD, No thyromegaly. CV: RRR, no murmurs Pulm: Normal effort, CTA throughout, no wheezing Ext: Warm, no edema, trace edema bilaterally Skin: No atypical appearing moles. No rashes

## 2016-10-15 NOTE — Assessment & Plan Note (Signed)
10 year ASCVD risk is 57%. I counseled him about with his current tobacco use his chances of having a ischemic event are almost inevitable. He is doing well on pravastatin 40 mg once daily which we will continue.

## 2017-01-28 ENCOUNTER — Ambulatory Visit (INDEPENDENT_AMBULATORY_CARE_PROVIDER_SITE_OTHER): Payer: Medicare HMO | Admitting: Student in an Organized Health Care Education/Training Program

## 2017-01-28 ENCOUNTER — Encounter: Payer: Self-pay | Admitting: Student in an Organized Health Care Education/Training Program

## 2017-01-28 VITALS — BP 140/71 | HR 56 | Temp 97.7°F | Ht 71.0 in | Wt 221.2 lb

## 2017-01-28 DIAGNOSIS — E038 Other specified hypothyroidism: Secondary | ICD-10-CM

## 2017-01-28 DIAGNOSIS — D225 Melanocytic nevi of trunk: Secondary | ICD-10-CM

## 2017-01-28 DIAGNOSIS — D229 Melanocytic nevi, unspecified: Secondary | ICD-10-CM | POA: Insufficient documentation

## 2017-01-28 DIAGNOSIS — E0590 Thyrotoxicosis, unspecified without thyrotoxic crisis or storm: Secondary | ICD-10-CM | POA: Diagnosis not present

## 2017-01-28 DIAGNOSIS — F17211 Nicotine dependence, cigarettes, in remission: Secondary | ICD-10-CM | POA: Diagnosis not present

## 2017-01-28 DIAGNOSIS — Z9114 Patient's other noncompliance with medication regimen: Secondary | ICD-10-CM | POA: Diagnosis not present

## 2017-01-28 DIAGNOSIS — Z7984 Long term (current) use of oral hypoglycemic drugs: Secondary | ICD-10-CM

## 2017-01-28 DIAGNOSIS — J329 Chronic sinusitis, unspecified: Secondary | ICD-10-CM | POA: Diagnosis not present

## 2017-01-28 DIAGNOSIS — E119 Type 2 diabetes mellitus without complications: Secondary | ICD-10-CM | POA: Diagnosis not present

## 2017-01-28 DIAGNOSIS — Z79899 Other long term (current) drug therapy: Secondary | ICD-10-CM | POA: Diagnosis not present

## 2017-01-28 DIAGNOSIS — Z720 Tobacco use: Secondary | ICD-10-CM

## 2017-01-28 DIAGNOSIS — B9789 Other viral agents as the cause of diseases classified elsewhere: Secondary | ICD-10-CM

## 2017-01-28 DIAGNOSIS — I10 Essential (primary) hypertension: Secondary | ICD-10-CM | POA: Diagnosis not present

## 2017-01-28 DIAGNOSIS — R69 Illness, unspecified: Secondary | ICD-10-CM | POA: Diagnosis not present

## 2017-01-28 DIAGNOSIS — E785 Hyperlipidemia, unspecified: Secondary | ICD-10-CM

## 2017-01-28 LAB — POCT GLYCOSYLATED HEMOGLOBIN (HGB A1C): Hemoglobin A1C: 7

## 2017-01-28 LAB — GLUCOSE, CAPILLARY: GLUCOSE-CAPILLARY: 146 mg/dL — AB (ref 65–99)

## 2017-01-28 NOTE — Assessment & Plan Note (Addendum)
1.1 cm atypical nevus on his upper back at the right side, has irregular borders but homogenous color. He reports having a lot of moles but is unable to see this one specifically. I don't remember noticing this on previous exams. I offered the patient excisional biopsy but he declined. He has an intense fear of needles and procedures, I'm not sure he will ever except shave biopsy. For now I've taken a picture of the mole and inserted into the medical record and I'll follow it up in 3 months.

## 2017-01-28 NOTE — Progress Notes (Signed)
Assessment and Plan:  See Encounters tab for problem-based medical decision making.   __________________________________________________________  HPI:  66 year old man here for follow-up of diabetes. He reports doing very well at home. He lives with his wife, he works part time as a Engineer, water. Denies any chest pain, fevers chills, dyspnea with exertion, orthopnea, or PND. He reports that a good exertional capacity, can work steadily without feeling short of breath. Denies daytime drowsiness. He has successfully weaned down his tobacco use and has been abstinent from cigarettes for last 2 weeks. Did not require nicotine replacement therapy or pharmacotherapy for this. Reports intermediate compliance with his medications. He says sometimes he forgets his morning dose of metformin. He takes his pravastatin only about twice a week because of myalgias.  __________________________________________________________  Problem List: Patient Active Problem List   Diagnosis Date Noted  . Tobacco abuse 12/31/2013    Priority: High  . Diabetes type 2, controlled ([place]) 11/08/2010    Priority: High  . Hypertension 11/23/2011    Priority: Medium  . Dyslipidemia 11/08/2010    Priority: Medium  . Sinusitis, chronic 01/28/2017    Priority: Low  . Subclinical hyperthyroidism 01/28/2017    Priority: Low  . Health care maintenance 12/31/2013    Priority: Low  . Atypical nevus 01/28/2017    Medications: Reconciled today in Epic __________________________________________________________  Physical Exam:  Vital Signs: Vitals:   01/28/17 0829  BP: 140/71  Pulse: (!) 56  Temp: 97.7 F (36.5 C)  TempSrc: Oral  SpO2: 100%  Weight: 221 lb 3.2 oz (100.3 kg)  Height: 5\' 11"  (1.803 m)    Gen: Well appearing, NAD Neck: No cervical LAD, No thyromegaly or nodules, no bruit over the thyroid gland. CV: RRR, no murmurs Pulm: Normal effort, CTA throughout, no wheezing  Ext: Warm, no edema, normal  joints Skin: 1.1 cm irregular nevus picture below. No rashes

## 2017-01-28 NOTE — Assessment & Plan Note (Signed)
Thyroid testing 8 months ago showed a TSH at borderline low at 0.4 and normal free T4 and T3. He is having some symptoms of heat intolerance and generalized fatigue. Plan to repeat PT thyroid functioning today. If his TSH is still significant depressed we should rule out Graves' disease with antibodies and a radioiodine uptake and scan.

## 2017-01-28 NOTE — Assessment & Plan Note (Signed)
Hemoglobin A1c is 7.0%, which is at goal. Plan to continue metformin 500 mg twice a day. I encouraged better compliance. He is on lisinopril for renal protection. Also using aspirin and pravastatin for primary prevention. He struggles with compliance with pravastatin because  of myalgias. I do not think he would tolerate a more intense statin at this time.

## 2017-01-28 NOTE — Patient Instructions (Signed)
BUFFERED ISOTONIC SALINE NASAL IRRIGATION  The Benefits:  1. When you irrigate, the isotonic saline (salt water) acts as a solvent and washes the mucus crusts and other debris from your nose.  2. This decongests and improves the airflow into your nose. The sinus passages begin to open.  3. Studies have also shown that a salt water and an alkaline (baking soda) irrigation solution improves nasal membrane cell function (mucociliary flow of mucus debris).  The Recipe:  1. Choose a 1-quart glass jar that is thoroughly cleansed.  2. Fill with sterile or distilled water, or you can boil water from the tap.  3. Add 1 to 2 heaping teaspoons of "pickling/canning/sea" salt (NOT table salt as it contains a large number of additives). This salt is available at the grocery store in the food canning section.  4. Add 1 teaspoon of Arm [employer] Hammer Baking Soda (pure bicarbonate).  5. Mix ingredients together and store at room temperature. Discard after one week. If you find this solution too strong, you may decrease the amount of salt added to 1 to 1  teaspoons. With children it is often best to start with a milder solution and advance slowly. Irrigate with 240 ml (8 oz) twice daily.  The Instructions:  You should plan to irrigate your nose with buffered isotonic saline 2 times per day. Many people prefer to warm the solution slightly in the microwave - but be sure that the solution is NOT HOT. Stand over the sink (some do this in the shower) and squirt the solution into each side of your nose, keeping your mouth open. This allows you to spit the saltwater out of your mouth. It will not harm you if you swallow a little.  If you have been told to use a nasal steroid such as Flonase, Nasonex, or Nasacort, you should always use isortonic saline solution first, then use your nasal steroid product. The nasal steroid is much more effective when sprayed onto clean nasal membranes and the steroid medicine will reach  deeper into the nose.  Most people experience a little burning sensation the first few times they use a isotonic saline solution, but this usually goes away within a few days.    

## 2017-01-28 NOTE — Assessment & Plan Note (Signed)
Blood pressure today was at goal at 140/71. Plan to continue lisinopril 20 mg once daily.

## 2017-01-28 NOTE — Assessment & Plan Note (Signed)
Patient assists successfully abstained from tobacco for the last 2 weeks. He was able to wean himself down. I congratulated him and we should monitor this at upcoming appointments. Of note patient would qualify for lung cancer screening. His 6 year risk is 5.7% based on his tobacco use history. We should talk about this in the future, not sure if he will be right for this screening because he is very minimalist about medical interventions.

## 2017-01-28 NOTE — Assessment & Plan Note (Signed)
Clinical course and exam are consistent with a mild chronic sinusitis which is all certainly viral in etiology. We discussed sinus irrigation as supportive care. If this does not work we can try intranasal steroids in the future. I gave him printed instructions about sinus irrigation.

## 2017-01-29 ENCOUNTER — Encounter: Payer: Self-pay | Admitting: Student in an Organized Health Care Education/Training Program

## 2017-01-29 LAB — TSH: TSH: 0.473 u[IU]/mL (ref 0.450–4.500)

## 2017-01-29 LAB — T4, FREE: FREE T4: 1.21 ng/dL (ref 0.82–1.77)

## 2017-04-18 ENCOUNTER — Other Ambulatory Visit: Payer: Self-pay

## 2017-04-18 NOTE — Telephone Encounter (Signed)
lisinopril (PRINIVIL,ZESTRIL) 20 MG tablet, REFILL REQUEST @ [facility] ON [address].

## 2017-04-19 MED ORDER — LISINOPRIL 20 MG PO TABS: 20.0000 mg | ORAL_TABLET | Freq: Every day | ORAL | 3 refills | Status: DC

## 2017-04-29 ENCOUNTER — Ambulatory Visit (INDEPENDENT_AMBULATORY_CARE_PROVIDER_SITE_OTHER): Payer: Medicare HMO | Admitting: Student in an Organized Health Care Education/Training Program

## 2017-04-29 VITALS — BP 158/83 | HR 60 | Temp 97.5°F | Ht 71.0 in | Wt 234.4 lb

## 2017-04-29 DIAGNOSIS — E119 Type 2 diabetes mellitus without complications: Secondary | ICD-10-CM | POA: Diagnosis not present

## 2017-04-29 DIAGNOSIS — Z7982 Long term (current) use of aspirin: Secondary | ICD-10-CM | POA: Diagnosis not present

## 2017-04-29 DIAGNOSIS — E669 Obesity, unspecified: Secondary | ICD-10-CM | POA: Diagnosis not present

## 2017-04-29 DIAGNOSIS — I10 Essential (primary) hypertension: Secondary | ICD-10-CM

## 2017-04-29 DIAGNOSIS — E66811 Obesity, class 1: Secondary | ICD-10-CM

## 2017-04-29 DIAGNOSIS — Z0000 Encounter for general adult medical examination without abnormal findings: Secondary | ICD-10-CM

## 2017-04-29 DIAGNOSIS — D225 Melanocytic nevi of trunk: Secondary | ICD-10-CM | POA: Diagnosis not present

## 2017-04-29 DIAGNOSIS — Z6832 Body mass index (BMI) 32.0-32.9, adult: Secondary | ICD-10-CM

## 2017-04-29 DIAGNOSIS — R351 Nocturia: Secondary | ICD-10-CM

## 2017-04-29 DIAGNOSIS — Z79899 Other long term (current) drug therapy: Secondary | ICD-10-CM | POA: Diagnosis not present

## 2017-04-29 DIAGNOSIS — F17211 Nicotine dependence, cigarettes, in remission: Secondary | ICD-10-CM

## 2017-04-29 DIAGNOSIS — Z7984 Long term (current) use of oral hypoglycemic drugs: Secondary | ICD-10-CM | POA: Diagnosis not present

## 2017-04-29 DIAGNOSIS — Z720 Tobacco use: Secondary | ICD-10-CM

## 2017-04-29 DIAGNOSIS — D229 Melanocytic nevi, unspecified: Secondary | ICD-10-CM

## 2017-04-29 DIAGNOSIS — R69 Illness, unspecified: Secondary | ICD-10-CM | POA: Diagnosis not present

## 2017-04-29 LAB — GLUCOSE, CAPILLARY: GLUCOSE-CAPILLARY: 138 mg/dL — AB (ref 65–99)

## 2017-04-29 LAB — POCT GLYCOSYLATED HEMOGLOBIN (HGB A1C): Hemoglobin A1C: 7.2

## 2017-04-29 MED ORDER — LISINOPRIL 40 MG PO TABS: 40.0000 mg | ORAL_TABLET | Freq: Every day | ORAL | 3 refills | Status: DC

## 2017-04-29 NOTE — Assessment & Plan Note (Signed)
Hemoglobin A1c is at 7.2% which is at goal. He is doing excellent job, continue metformin 500 mg twice a day. He reports compliance with aspirin and pravastatin for primary prevention. Also using lisinopril for renal protection. I referred him to ophthalmology, last visit there was 2016.

## 2017-04-29 NOTE — Assessment & Plan Note (Signed)
Worsening problem, patient gained 13 pounds in last 3 months likely due to tobacco cessation. BMI today 32.8. We discussed healthy diet choices and the importance of exercise. He set a goal for improving diet and lose 10 pounds in the next 3 months.

## 2017-04-29 NOTE — Assessment & Plan Note (Signed)
This is unchanged compared to the picture I took at our last visit encounter. We will continue to observe, but likely this is benign.

## 2017-04-29 NOTE — Progress Notes (Signed)
Assessment and Plan:  See Encounters tab for problem-based medical decision making.   __________________________________________________________  HPI:  66 year old man here for follow-up of diabetes. Patient is doing well with no acute complaints. He reports good compliance with medications and no side effects. No hypoglycemia. He tells me that he successfully quit smoking 5 months ago, has noticed increased snacking and weight gain as a result. Continues to work, walks to work for exercise. No fevers or chills, no chest pain or DOE. No recent hospitalizations or surgeries. Lives with his wife who did not accompany him today. Independent in all activities of daily living.  __________________________________________________________  Problem List: Patient Active Problem List   Diagnosis Date Noted  . Tobacco abuse 12/31/2013    Priority: High  . Diabetes type 2, controlled ([place]) 11/08/2010    Priority: High  . Obesity (BMI 30.0-34.9) 04/29/2017    Priority: Medium  . Hypertension 11/23/2011    Priority: Medium  . Dyslipidemia 11/08/2010    Priority: Medium  . Sinusitis, chronic 01/28/2017    Priority: Low  . Subclinical hyperthyroidism 01/28/2017    Priority: Low  . Atypical nevus 01/28/2017    Priority: Low  . Health care maintenance 12/31/2013    Priority: Low    Medications: Reconciled today in Epic __________________________________________________________  Physical Exam:  Vital Signs: Vitals:   04/29/17 0823 04/29/17 0901  BP: (!) 162/80 (!) 158/83  Pulse: (!) 59 60  Temp: 97.5 F (36.4 C)   TempSrc: Oral   SpO2: 97%   Weight: 234 lb 6.4 oz (106.3 kg)   Height: 5\' 11"  (1.803 m)     Gen: Well appearing, NAD Neck: No cervical LAD, No thyromegaly or nodules. CV: RRR, no murmurs Pulm: Normal effort, CTA throughout, no wheezing Ext: Warm, no edema, normal joints Skin: Atypical appearing mole on his right scapula is unchanged from the last time I saw it several  months ago, not enlarged, same irregular borders, no change in pigmentation. No rashes

## 2017-04-29 NOTE — Patient Instructions (Signed)
1. Congratulations on quitting smoking! This is by far the best thing you can do for your health. Keep up the great work, let me know if you need any additional help like nicotine patches, gum, or other medication assistance.   2. We will check your blood work today including screening for prostate cancer. I will send you a letter with the results or call you if anything is abnormal.   3. Your blood pressure is elevated, probably from the recent weight gain. We will increase your Lisinopril from 20mg  once a day, to 40mg  once a day.

## 2017-04-29 NOTE — Assessment & Plan Note (Signed)
Blood pressure is elevated above goal even on recheck. Likely due to recent weight gain. Plan is to increase lisinopril from 20mg  to 40mg  daily. Check BMP today.

## 2017-04-29 NOTE — Assessment & Plan Note (Addendum)
We discussed risk and benefits of prostate cancer screening. No family history. Mild LUTS including nocturia. We decided to go ahead with PSA testing.  Also ordered HCV screening today.

## 2017-04-29 NOTE — Assessment & Plan Note (Signed)
Successfully quit smoking five months ago. Patient declines needing meds or NRT at this time. I encouraged continued abstinence from tobacco products.

## 2017-04-30 ENCOUNTER — Encounter: Payer: Self-pay | Admitting: Student in an Organized Health Care Education/Training Program

## 2017-04-30 LAB — BMP8+ANION GAP
Anion Gap: 13 mmol/L (ref 10.0–18.0)
BUN / CREAT RATIO: 15 (ref 10–24)
BUN: 13 mg/dL (ref 8–27)
CALCIUM: 9 mg/dL (ref 8.6–10.2)
CHLORIDE: 103 mmol/L (ref 96–106)
CO2: 23 mmol/L (ref 18–29)
Creatinine, Ser: 0.86 mg/dL (ref 0.76–1.27)
GFR, EST AFRICAN AMERICAN: 103 mL/min/{1.73_m2} (ref >59)
GFR, EST NON AFRICAN AMERICAN: 89 mL/min/{1.73_m2} (ref >59)
GLUCOSE: 130 mg/dL — AB (ref 65–99)
Potassium: 4.5 mmol/L (ref 3.5–5.2)
Sodium: 139 mmol/L (ref 134–144)

## 2017-04-30 LAB — PSA: Prostate Specific Ag, Serum: 3.3 ng/mL (ref 0.0–4.0)

## 2017-04-30 LAB — HEPATITIS C ANTIBODY: Hep C Virus Ab: 0.1 s/co ratio (ref 0.0–0.9)

## 2017-05-23 DIAGNOSIS — E119 Type 2 diabetes mellitus without complications: Secondary | ICD-10-CM | POA: Diagnosis not present

## 2017-05-23 LAB — HM DIABETES EYE EXAM

## 2017-06-20 ENCOUNTER — Encounter: Payer: Self-pay | Admitting: *Deleted

## 2017-07-22 ENCOUNTER — Ambulatory Visit (INDEPENDENT_AMBULATORY_CARE_PROVIDER_SITE_OTHER): Payer: Medicare HMO | Admitting: Student in an Organized Health Care Education/Training Program

## 2017-07-22 VITALS — BP 154/94 | HR 64 | Temp 97.5°F | Ht 71.0 in | Wt 244.1 lb

## 2017-07-22 DIAGNOSIS — Z7984 Long term (current) use of oral hypoglycemic drugs: Secondary | ICD-10-CM

## 2017-07-22 DIAGNOSIS — I10 Essential (primary) hypertension: Secondary | ICD-10-CM

## 2017-07-22 DIAGNOSIS — Z7982 Long term (current) use of aspirin: Secondary | ICD-10-CM | POA: Diagnosis not present

## 2017-07-22 DIAGNOSIS — Z79899 Other long term (current) drug therapy: Secondary | ICD-10-CM | POA: Diagnosis not present

## 2017-07-22 DIAGNOSIS — I739 Peripheral vascular disease, unspecified: Secondary | ICD-10-CM

## 2017-07-22 DIAGNOSIS — Z6834 Body mass index (BMI) 34.0-34.9, adult: Secondary | ICD-10-CM

## 2017-07-22 DIAGNOSIS — D225 Melanocytic nevi of trunk: Secondary | ICD-10-CM

## 2017-07-22 DIAGNOSIS — E119 Type 2 diabetes mellitus without complications: Secondary | ICD-10-CM

## 2017-07-22 DIAGNOSIS — F17211 Nicotine dependence, cigarettes, in remission: Secondary | ICD-10-CM

## 2017-07-22 DIAGNOSIS — M79662 Pain in left lower leg: Secondary | ICD-10-CM | POA: Diagnosis not present

## 2017-07-22 DIAGNOSIS — E669 Obesity, unspecified: Secondary | ICD-10-CM | POA: Diagnosis not present

## 2017-07-22 DIAGNOSIS — R69 Illness, unspecified: Secondary | ICD-10-CM | POA: Diagnosis not present

## 2017-07-22 DIAGNOSIS — Z720 Tobacco use: Secondary | ICD-10-CM

## 2017-07-22 DIAGNOSIS — Z0000 Encounter for general adult medical examination without abnormal findings: Secondary | ICD-10-CM

## 2017-07-22 LAB — GLUCOSE, CAPILLARY: Glucose-Capillary: 155 mg/dL — ABNORMAL HIGH (ref 65–99)

## 2017-07-22 LAB — POCT GLYCOSYLATED HEMOGLOBIN (HGB A1C): Hemoglobin A1C: 8.2

## 2017-07-22 MED ORDER — METFORMIN HCL 500 MG PO TABS: 1000.0000 mg | ORAL_TABLET | Freq: Two times a day (BID) | ORAL | 3 refills | Status: DC

## 2017-07-22 MED ORDER — AMLODIPINE BESYLATE 5 MG PO TABS: 5.0000 mg | ORAL_TABLET | Freq: Every day | ORAL | 3 refills | Status: DC

## 2017-07-22 NOTE — Patient Instructions (Addendum)
1. You A1c is above goal today at 8.2%. I would like to increase your Metformin to 1000mg  twice a day. Please reduce the amount of sugar you are eating as well. We will recheck the A1c in 3 months, but if it is still above goal we will have to add a second diabetes medication.   2. Please work on weight loss over the next three months. Exercise by walking at least four times a week. We also set a goal to reduce the amount of snacking you are doing.   3. We screened your abdomen for an aortic aneurysm today, which was normal.   4. We also screened your legs for artery disease, which was normal as well.   5. We are starting a second blood pressure medicine for you. Please let me know if you have any side effects. And check your blood pressure a couple times a week. Your goal blood pressure is less than 130/85

## 2017-07-22 NOTE — Assessment & Plan Note (Signed)
A1c is 8.2% today, which is up and above goal. Plan is to increase Metformin from 1000mg  daily to twice daily. Continue with lisinopril for renal protection, aspirin for primary prevention of ischemic disease. Unfortunately he was unable to tolerate pravastatin due to myalgias, we can consider checking vitamin d in the future, but I didn't push the issue today. We did a foot exam which was fine.

## 2017-07-22 NOTE — Assessment & Plan Note (Signed)
Weight is up 20 lbs over the last one year, likely due to tobacco cessation and increase stress. We set a goal today to increase physical activity, reduce sugar intake, and reduce snacking habits.

## 2017-07-22 NOTE — Assessment & Plan Note (Signed)
Quit smoking in January 2018. He is doing well, no further tobacco use.

## 2017-07-22 NOTE — Assessment & Plan Note (Signed)
Blood pressure above goal today, even on recheck. Plan is to continue lisinopril 40mg  daily and add amlodipine 5mg  daily. Return to clinic in three months for recheck, and I asked him to check BP readings at home.

## 2017-07-22 NOTE — Assessment & Plan Note (Signed)
AAA screening done today in clinic and was normal. Aorta max diameter was 1.3 cm.   Short axis view of epigastric aorta, which was normal in diameter.    Short axis view of the infrarenal aorta which was normal in diameter.

## 2017-07-22 NOTE — Assessment & Plan Note (Signed)
Patient with symptoms of mild claudication. He is at risk for PAD given diabetes, hypertension, and tobacco history. Unfortunately unable to tolerate statin therapy due to myalgias. We did ABIs today in office: left 1.09 and right 1.00. Plan is to continue aspirin and monitoring.

## 2017-07-22 NOTE — Progress Notes (Signed)
   Assessment and Plan:  See Encounters tab for problem-based medical decision making.   __________________________________________________________  HPI:   66 year old man here for follow-up of diabetes. Patient has noted an increase in his weight over the last 1 year. He says that he has been snacking much more than usual. Also reports being under significant stress at work and with his finances. Reports good compliance with his medications at home, no side effects. He does endorse eating significant amount of desert, especially ice cream several times a week. He doesn't formally exercise, but he usually mows his lawn once weekly. Denies any chest pain, or pressure. He does endorse having increasing calf pain usually after he mows the lawn. He reports that his breathing is stable, denies any worsening dyspnea with exertion. He does feel successful in quitting smoking about 8 months ago. However he is sad that he thinks it is lead to this significant weight gain.  __________________________________________________________  Problem List: Patient Active Problem List   Diagnosis Date Noted  . Claudication of lower extremity ([place]) 07/22/2017    Priority: High  . Tobacco abuse 12/31/2013    Priority: High  . Diabetes type 2, controlled ([place]) 11/08/2010    Priority: High  . Obesity (BMI 30.0-34.9) 04/29/2017    Priority: Medium  . Hypertension 11/23/2011    Priority: Medium  . Dyslipidemia 11/08/2010    Priority: Medium  . Health care maintenance 12/31/2013    Priority: Low    Medications: Reconciled today in Epic __________________________________________________________  Physical Exam:  Vital Signs: Vitals:   07/22/17 0821 07/22/17 1018  BP: (!) 151/79 (!) 154/94  Pulse: (!) 59 64  Temp: (!) 97.5 F (36.4 C)   TempSrc: Oral   SpO2: 96%   Weight: 244 lb 1.6 oz (110.7 kg)   Height: 5\' 11"  (1.803 m)     Gen: Well appearing, NAD Neck: No cervical LAD, No thyromegaly or  nodules, No JVD. CV: RRR, no murmurs Pulm: Normal effort, CTA throughout, no wheezing Abd: Soft, NT, ND, normal BS.  Ext: Warm, no edema, normal joints Skin: atypical nevus on his upper mid back is stable in shape and size.Marland Kitchen No rashes

## 2017-09-02 ENCOUNTER — Telehealth: Payer: Self-pay | Admitting: *Deleted

## 2017-09-02 NOTE — Telephone Encounter (Signed)
Pt called for refill of lisinopril, called [facility], pt has refills there, they will fill and call pt, pt informed

## 2017-10-21 ENCOUNTER — Encounter: Payer: Self-pay | Admitting: Student in an Organized Health Care Education/Training Program

## 2017-10-21 ENCOUNTER — Ambulatory Visit (INDEPENDENT_AMBULATORY_CARE_PROVIDER_SITE_OTHER): Payer: Medicare HMO | Admitting: Student in an Organized Health Care Education/Training Program

## 2017-10-21 ENCOUNTER — Other Ambulatory Visit: Payer: Self-pay

## 2017-10-21 VITALS — BP 171/94 | HR 95 | Temp 98.2°F | Ht 71.0 in | Wt 239.3 lb

## 2017-10-21 DIAGNOSIS — R69 Illness, unspecified: Secondary | ICD-10-CM | POA: Diagnosis not present

## 2017-10-21 DIAGNOSIS — E119 Type 2 diabetes mellitus without complications: Secondary | ICD-10-CM

## 2017-10-21 DIAGNOSIS — F439 Reaction to severe stress, unspecified: Secondary | ICD-10-CM | POA: Diagnosis not present

## 2017-10-21 DIAGNOSIS — I10 Essential (primary) hypertension: Secondary | ICD-10-CM

## 2017-10-21 LAB — POCT GLYCOSYLATED HEMOGLOBIN (HGB A1C): Hemoglobin A1C: 7.9

## 2017-10-21 LAB — GLUCOSE, CAPILLARY: GLUCOSE-CAPILLARY: 205 mg/dL — AB (ref 65–99)

## 2017-10-21 MED ORDER — METFORMIN HCL 1000 MG PO TABS: 1000.0000 mg | ORAL_TABLET | Freq: Two times a day (BID) | ORAL | 3 refills | Status: DC

## 2017-10-21 NOTE — Assessment & Plan Note (Signed)
Hemoglobin A1c 7.9% today, a little better than last quarter.  We still need to work on consistency of his meals and taking medications.  Plan is to continue with metformin 1000 mg twice daily.  Ideally his goal A1c is around 7%, I do not want to increase his diabetes medications because of current life stressors.  If this continue in the future likely will add a SGLT-2 inhibitor.
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
## 2017-10-21 NOTE — Assessment & Plan Note (Signed)
Blood pressure elevated today above goal likely due to life stressors and poor compliance with antihypertensives.  Plan is to continue with amlodipine 5 mg and lisinopril 40 mg daily.  I do not want to make any changes today because of all the stress going on.  Patient understands and agrees.  Need to work on consistency of medications.  Also asked for readings outside of the clinic.

## 2017-10-21 NOTE — Patient Instructions (Signed)
1. Continue your medicines as prescribed.   2. We talked about your stress and I encouraged you to eat consistently, take your medicines daily.
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
## 2017-10-23 ENCOUNTER — Telehealth: Payer: Self-pay | Admitting: *Deleted

## 2017-10-23 DIAGNOSIS — E119 Type 2 diabetes mellitus without complications: Secondary | ICD-10-CM

## 2017-10-23 MED ORDER — METFORMIN HCL 1000 MG PO TABS: 1000.0000 mg | ORAL_TABLET | Freq: Two times a day (BID) | ORAL | 3 refills | Status: DC

## 2017-10-23 NOTE — Telephone Encounter (Signed)
[facility] need clarification on Metformin 1000mg - 1 BID or 1/2 qAM & 1 qPM?  Pls send new rx. Thanks!

## 2017-10-23 NOTE — Telephone Encounter (Signed)
Done

## 2018-01-20 ENCOUNTER — Ambulatory Visit (INDEPENDENT_AMBULATORY_CARE_PROVIDER_SITE_OTHER): Payer: Medicare HMO | Admitting: Student in an Organized Health Care Education/Training Program

## 2018-01-20 ENCOUNTER — Encounter: Payer: Self-pay | Admitting: Student in an Organized Health Care Education/Training Program

## 2018-01-20 VITALS — BP 134/78 | HR 67 | Temp 97.9°F | Wt 241.3 lb

## 2018-01-20 DIAGNOSIS — E669 Obesity, unspecified: Secondary | ICD-10-CM

## 2018-01-20 DIAGNOSIS — Z79899 Other long term (current) drug therapy: Secondary | ICD-10-CM | POA: Diagnosis not present

## 2018-01-20 DIAGNOSIS — Z87891 Personal history of nicotine dependence: Secondary | ICD-10-CM | POA: Diagnosis not present

## 2018-01-20 DIAGNOSIS — Z7982 Long term (current) use of aspirin: Secondary | ICD-10-CM | POA: Diagnosis not present

## 2018-01-20 DIAGNOSIS — Z6833 Body mass index (BMI) 33.0-33.9, adult: Secondary | ICD-10-CM | POA: Diagnosis not present

## 2018-01-20 DIAGNOSIS — E119 Type 2 diabetes mellitus without complications: Secondary | ICD-10-CM

## 2018-01-20 DIAGNOSIS — I10 Essential (primary) hypertension: Secondary | ICD-10-CM

## 2018-01-20 DIAGNOSIS — Z7984 Long term (current) use of oral hypoglycemic drugs: Secondary | ICD-10-CM

## 2018-01-20 DIAGNOSIS — F439 Reaction to severe stress, unspecified: Secondary | ICD-10-CM

## 2018-01-20 LAB — POCT GLYCOSYLATED HEMOGLOBIN (HGB A1C): Hemoglobin A1C: 7.4

## 2018-01-20 LAB — GLUCOSE, CAPILLARY: GLUCOSE-CAPILLARY: 191 mg/dL — AB (ref 65–99)
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
## 2018-01-20 NOTE — Assessment & Plan Note (Signed)
Stress at home is much improved now that his wife is been back with him for about a month.  Much better compliance with medications as results.  We talked about improving coping techniques and focusing on improved nutrition.

## 2018-01-20 NOTE — Assessment & Plan Note (Signed)
Blood pressure is much improved today, essentially at goal.  Plan is to continue with lisinopril 40 mg and amlodipine 5 mg once daily.  I talked about the importance of consistent compliance with medications.

## 2018-01-20 NOTE — Assessment & Plan Note (Signed)
Current weight 241 pounds with BMI 33.  Patient is gained about 20 pounds over the last 1 year, likely as a direct result of tobacco cessation.  We talked about improving nutrition, and increasing active lifestyle including some component of sustained exercise.  Patient understands that he can try to work on this.  We set a goal for some of the next 3 months.

## 2018-01-20 NOTE — Assessment & Plan Note (Signed)
Hemoglobin A1c of 7.4% which is near at goal.  We talked about improving nutrition and trying to increase activity, especially some form of exercise.  Plan to continue with metformin 1000 mg twice daily.  He is taking aspirin 81 mg daily for primary prevention, which I think is fine.

## 2018-04-21 ENCOUNTER — Other Ambulatory Visit: Payer: Self-pay

## 2018-04-21 ENCOUNTER — Encounter: Payer: Self-pay | Admitting: Student in an Organized Health Care Education/Training Program

## 2018-04-21 ENCOUNTER — Ambulatory Visit (INDEPENDENT_AMBULATORY_CARE_PROVIDER_SITE_OTHER): Payer: Medicare HMO | Admitting: Student in an Organized Health Care Education/Training Program

## 2018-04-21 VITALS — BP 137/76 | HR 74 | Temp 98.1°F | Ht 71.0 in | Wt 238.5 lb

## 2018-04-21 DIAGNOSIS — I10 Essential (primary) hypertension: Secondary | ICD-10-CM | POA: Diagnosis not present

## 2018-04-21 DIAGNOSIS — Z79899 Other long term (current) drug therapy: Secondary | ICD-10-CM

## 2018-04-21 DIAGNOSIS — Z7984 Long term (current) use of oral hypoglycemic drugs: Secondary | ICD-10-CM | POA: Diagnosis not present

## 2018-04-21 DIAGNOSIS — R011 Cardiac murmur, unspecified: Secondary | ICD-10-CM | POA: Diagnosis not present

## 2018-04-21 DIAGNOSIS — E785 Hyperlipidemia, unspecified: Secondary | ICD-10-CM | POA: Diagnosis not present

## 2018-04-21 DIAGNOSIS — E1159 Type 2 diabetes mellitus with other circulatory complications: Secondary | ICD-10-CM | POA: Diagnosis not present

## 2018-04-21 DIAGNOSIS — Z125 Encounter for screening for malignant neoplasm of prostate: Secondary | ICD-10-CM | POA: Insufficient documentation

## 2018-04-21 DIAGNOSIS — E559 Vitamin D deficiency, unspecified: Secondary | ICD-10-CM | POA: Diagnosis not present

## 2018-04-21 DIAGNOSIS — E118 Type 2 diabetes mellitus with unspecified complications: Secondary | ICD-10-CM

## 2018-04-21 DIAGNOSIS — E669 Obesity, unspecified: Secondary | ICD-10-CM | POA: Diagnosis not present

## 2018-04-21 DIAGNOSIS — Z6833 Body mass index (BMI) 33.0-33.9, adult: Secondary | ICD-10-CM | POA: Diagnosis not present

## 2018-04-21 DIAGNOSIS — E66811 Obesity, class 1: Secondary | ICD-10-CM

## 2018-04-21 DIAGNOSIS — E010 Iodine-deficiency related diffuse (endemic) goiter: Secondary | ICD-10-CM | POA: Diagnosis not present

## 2018-04-21 DIAGNOSIS — E1169 Type 2 diabetes mellitus with other specified complication: Secondary | ICD-10-CM

## 2018-04-21 LAB — POCT GLYCOSYLATED HEMOGLOBIN (HGB A1C): Hemoglobin A1C: 8.4

## 2018-04-21 LAB — GLUCOSE, CAPILLARY: GLUCOSE-CAPILLARY: 188 mg/dL — AB (ref 65–99)

## 2018-04-21 NOTE — Progress Notes (Signed)
   Assessment and Plan:  See Encounters tab for problem-based medical decision making.   __________________________________________________________  HPI:   67 year old male here for follow-up of diabetes.  He reports doing very well over the last 3 months.  He was having a lot of stress in his life related to his wife's recent hip fracture.  She is now been back at home with him, is doing better, though still needing a lot of assistance.  Patient still works a Architect.  Overall he says the stress levels much improved.  Not exercising because he has some mild foot pain does.  Reports fair compliance with his medications, sometimes misses the second dose of metformin.  No other side effects to his medications.  Denies any chest pain or dyspnea on exertion.  No recent illnesses, hospitalizations, surgeries, etc.  Overall he says he feels like he is doing well and is blessed.  __________________________________________________________  Problem List: Patient Active Problem List   Diagnosis Date Noted  . DM (diabetes mellitus) with complications ([place]) 63/12/6008    Priority: High  . Obesity (BMI 30.0-34.9) 04/29/2017    Priority: Medium  . Hypertension 11/23/2011    Priority: Medium  . Dyslipidemia 11/08/2010    Priority: Medium  . Health care maintenance 12/31/2013    Priority: Low  . Thyromegaly 04/21/2018  . Screening for prostate cancer 04/21/2018    Medications: Reconciled today in Epic __________________________________________________________  Physical Exam:  Vital Signs: Vitals:   04/21/18 0815  BP: 137/76  Pulse: 74  Temp: 98.1 F (36.7 C)  TempSrc: Oral  SpO2: 98%  Weight: 238 lb 8 oz (108.2 kg)  Height: 5\' 11"  (1.803 m)    Gen: Well appearing, NAD Neck: No cervical LAD, moderate diffuse thyromegaly without nodules, No JVD. CV: RRR, 2/6 early systolic flow murmur best heard at RUSB (patient reports it has been present since childhood). Pulm: Normal effort,  CTA throughout, no wheezing Ext: Warm, no edema, normal joints

## 2018-04-21 NOTE — Patient Instructions (Signed)
You are doing great. Keep up the good work with your diet and try to exercise more to help with your weight.   We will check some blood work today, I will send you a letter with the results later this week.

## 2018-04-21 NOTE — Assessment & Plan Note (Addendum)
Diabetes has been under good control this year with A1c less than 8%.  A1c today 8.4%.  Continue with metformin 1000 mg twice daily.  Compliance is fair.  Diabetes is complicated by hypertension and obesity, both of which are stable.  I think if we need to arrange for a second medication in the future I would favor an SGLT2 inhibitor to help with his weight.  If A1c still above 8% at next visit,  I will recommend a second agent.

## 2018-04-21 NOTE — Assessment & Plan Note (Signed)
Moderate thyromegaly on exam today, first time I am noticing it.  TSH has been low normal in the past, last checked over one year ago.  Plan to recheck TSH today.  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

## 2018-04-21 NOTE — Assessment & Plan Note (Signed)
Weight gain has stabilized, down a few pounds since last visit.  BMI still around 33.  Doing okay with his nutrition but needs to exercise more.  We talked about the importance of activity.

## 2018-04-21 NOTE — Assessment & Plan Note (Signed)
Very high risk for an ischemic event based on lipids in 2017.  Last year he came off of pravastatin because of myalgias.  Plan to check vitamin D today, if there is a deficiency potentially we could replete this and then rechallenge with a low dose or intermittent dosing statin.

## 2018-04-21 NOTE — Assessment & Plan Note (Signed)
Blood pressure is at goal today.  Plan to continue with lisinopril and amlodipine.  Check renal function today and annually.

## 2018-04-21 NOTE — Assessment & Plan Note (Addendum)
Last PSA was normal in 2011.  No family history known.  No lower urinary tract symptoms to suggest prostatic hyperplasia.  We talked about benefits and risks of  prostate cancer screening.  I think he has a little bit more risk than average given African-American race, he agrees to checking PSA today.  If normal plan to recheck every 4 years or so.

## 2018-04-22 ENCOUNTER — Encounter: Payer: Self-pay | Admitting: Student in an Organized Health Care Education/Training Program

## 2018-04-22 ENCOUNTER — Telehealth: Payer: Self-pay | Admitting: Student in an Organized Health Care Education/Training Program

## 2018-04-22 LAB — BMP8+ANION GAP
ANION GAP: 18 mmol/L (ref 10.0–18.0)
BUN / CREAT RATIO: 14 (ref 10–24)
BUN: 13 mg/dL (ref 8–27)
CHLORIDE: 99 mmol/L (ref 96–106)
CO2: 20 mmol/L (ref 20–29)
Calcium: 9.5 mg/dL (ref 8.6–10.2)
Creatinine, Ser: 0.95 mg/dL (ref 0.76–1.27)
GFR calc Af Amer: 94 mL/min/{1.73_m2} (ref >59)
GFR calc non Af Amer: 81 mL/min/{1.73_m2} (ref >59)
GLUCOSE: 177 mg/dL — AB (ref 65–99)
POTASSIUM: 4.3 mmol/L (ref 3.5–5.2)
SODIUM: 137 mmol/L (ref 134–144)

## 2018-04-22 LAB — PSA: Prostate Specific Ag, Serum: 2.6 ng/mL (ref 0.0–4.0)

## 2018-04-22 LAB — VITAMIN D 25 HYDROXY (VIT D DEFICIENCY, FRACTURES): Vit D, 25-Hydroxy: 12.8 ng/mL — ABNORMAL LOW (ref 30.0–100.0)

## 2018-04-22 LAB — TSH: TSH: 0.854 u[IU]/mL (ref 0.450–4.500)

## 2018-04-22 MED ORDER — CHOLECALCIFEROL 1.25 MG (50000 UT) PO CAPS: 50000.0000 [IU] | ORAL_CAPSULE | ORAL | 0 refills | Status: AC

## 2018-04-22 NOTE — Telephone Encounter (Signed)
I spoke with patient over the phone about low vitamin D level. I think this is clinically important because of his mild depressed mood and statin-intolerance. Plan is to start cholecalciferol 50,000 units once weekly x 8 weeks, then recheck vitamin d level in 3 months.

## 2018-05-23 ENCOUNTER — Encounter: Payer: Self-pay | Admitting: Student in an Organized Health Care Education/Training Program

## 2018-07-06 ENCOUNTER — Other Ambulatory Visit: Payer: Self-pay | Admitting: Student in an Organized Health Care Education/Training Program

## 2018-07-06 DIAGNOSIS — I10 Essential (primary) hypertension: Secondary | ICD-10-CM

## 2018-11-24 ENCOUNTER — Encounter: Payer: Self-pay | Admitting: Student in an Organized Health Care Education/Training Program

## 2018-11-24 ENCOUNTER — Other Ambulatory Visit: Payer: Self-pay

## 2018-11-24 ENCOUNTER — Ambulatory Visit (INDEPENDENT_AMBULATORY_CARE_PROVIDER_SITE_OTHER): Payer: Medicare HMO | Admitting: Student in an Organized Health Care Education/Training Program

## 2018-11-24 VITALS — BP 158/89 | HR 75 | Temp 97.8°F | Wt 253.3 lb

## 2018-11-24 DIAGNOSIS — Z6835 Body mass index (BMI) 35.0-35.9, adult: Secondary | ICD-10-CM | POA: Diagnosis not present

## 2018-11-24 DIAGNOSIS — Z9111 Patient's noncompliance with dietary regimen: Secondary | ICD-10-CM

## 2018-11-24 DIAGNOSIS — Z7984 Long term (current) use of oral hypoglycemic drugs: Secondary | ICD-10-CM

## 2018-11-24 DIAGNOSIS — Z1211 Encounter for screening for malignant neoplasm of colon: Secondary | ICD-10-CM

## 2018-11-24 DIAGNOSIS — Z79899 Other long term (current) drug therapy: Secondary | ICD-10-CM | POA: Diagnosis not present

## 2018-11-24 DIAGNOSIS — Z7982 Long term (current) use of aspirin: Secondary | ICD-10-CM

## 2018-11-24 DIAGNOSIS — E1169 Type 2 diabetes mellitus with other specified complication: Secondary | ICD-10-CM | POA: Diagnosis not present

## 2018-11-24 DIAGNOSIS — E559 Vitamin D deficiency, unspecified: Secondary | ICD-10-CM

## 2018-11-24 DIAGNOSIS — Z9114 Patient's other noncompliance with medication regimen: Secondary | ICD-10-CM

## 2018-11-24 DIAGNOSIS — E669 Obesity, unspecified: Secondary | ICD-10-CM

## 2018-11-24 DIAGNOSIS — I10 Essential (primary) hypertension: Secondary | ICD-10-CM | POA: Diagnosis not present

## 2018-11-24 DIAGNOSIS — E119 Type 2 diabetes mellitus without complications: Secondary | ICD-10-CM

## 2018-11-24 DIAGNOSIS — Z0000 Encounter for general adult medical examination without abnormal findings: Secondary | ICD-10-CM

## 2018-11-24 LAB — POCT GLYCOSYLATED HEMOGLOBIN (HGB A1C): Hemoglobin A1C: 13.6 % — AB (ref 4.0–5.6)

## 2018-11-24 LAB — GLUCOSE, CAPILLARY: Glucose-Capillary: 349 mg/dL — ABNORMAL HIGH (ref 70–99)

## 2018-11-24 MED ORDER — METFORMIN HCL 1000 MG PO TABS: 1000.0000 mg | ORAL_TABLET | Freq: Two times a day (BID) | ORAL | 3 refills | Status: DC

## 2018-11-24 MED ORDER — AMLODIPINE BESYLATE 10 MG PO TABS: 10.0000 mg | ORAL_TABLET | Freq: Every day | ORAL | 3 refills | Status: DC

## 2018-11-24 NOTE — Assessment & Plan Note (Addendum)
Historically difficult to control with A1c is a little bit above 8%, now out of control at 13% today.  He struggles with daily compliance with just metformin.  Also struggles with diet and exercise requirements. These are worse recently because of home stressors and social-determinants of health.  We talked about improving some of these lifestyle issues the best we can.  I refilled metformin, he should restart at 1000 mg twice daily.  I would like him to him to have better compliance with one medication before adding a second agent. Very high risk for cardiovascular disease, continue with aspirin 81 mg daily.  Has been intolerant to statins in the past because of myalgias.  We treated his vitamin D deficiency earlier this year.  He does not feel ready to start a statin again, he is very worried about the return of the myalgias.

## 2018-11-24 NOTE — Patient Instructions (Signed)
We talked today about your high blood pressure, your blood sugar, and all the stress that is being put on you.  We set a goal of improving your nutrition, eating healthy fruits and vegetables that you make at home.  Reducing the amount of sweets like cookies, cakes, and sodas.  We talked about improving consistency with taking your medications.  We talked about colon cancer screening using a home test which will look for microscopic amounts of blood in her stool.  If that is normal we will not need to do a colonoscopy this year.  I would like to see you back in clinic in 3 months so we can continue to keep a close eye on your diabetes and blood pressure.

## 2018-11-24 NOTE — Assessment & Plan Note (Signed)
Blood pressure elevated today.  He has metabolic syndrome with obesity, hypertension, and diabetes.  High risk for cardiovascular disease.  We have struggled with blood pressure control due to poor compliance with daily medications.  Under a lot of stress right now, which we talked about different strategies to improve.  Plan is to increase amlodipine to 10 mg daily, continue lisinopril 40 mg daily.  I encouraged better compliance, warned that we might have to add a third agent if we do not see improvement soon.  Follow-up in 3 months.

## 2018-11-24 NOTE — Assessment & Plan Note (Signed)
Stable.  He completed an 8-week course of very high-dose vitamin D supplementation.  Now he is taking a daily low-dose over-the-counter vitamin D supplements.  Functionally doing well.  Plan is to recheck vitamin D at the next blood draw.  I try to minimize blood draws because he is very afraid of needles.

## 2018-11-24 NOTE — Assessment & Plan Note (Signed)
Due for colon cancer screening, last done in 2009 per report.  Patient would like to avoid the procedure colonoscopy if possible.  We are going to do a fecal immunochemical test.

## 2018-11-24 NOTE — Progress Notes (Signed)
   Assessment and Plan:  See Encounters tab for problem-based medical decision making.   __________________________________________________________  HPI:   67 year old man here for follow up of diabetes and hypertension. It has been 7 months since our last visit. He has no complaints today. He has been struggling at home recently with competing demands. He takes care of his 65 year old wife who is disabled and relies on him for many ADLs. He works a full time job transporting clients to medical appointments. He feels he can't take any more time off because they have to cover medical bills for his wife's hospitalization from last year. He draws social security but that is not enough to cover his expenses. He can walk 2 miles to work without feeling short of breath or chest pain. He has trouble sleeping because his mind races about all his stressors. He doesn't do any dedicated exercise, though he does have good exertional capacity. He reports making most of his meals at home, but does like to eat cookies, ice cream, and some soda. He says he tends to eat more of these sweet foods to manage his stress.  He endorses poor consistency with taking his medications. He says he has only been taking his metformin once a day. The metformin bottle is 1 months old, though it was a 3 months supply. He also takes the amlodipine intermittently, the bottle is 55 months old. He reports using a pill box sometimes.   __________________________________________________________  Problem List: Patient Active Problem List   Diagnosis Date Noted  . Type 2 diabetes mellitus with other specified complication ([place]) 54/56/2563    Priority: High  . Obesity (BMI 30.0-34.9) 04/29/2017    Priority: Medium  . Hypertension 11/23/2011    Priority: Medium  . Dyslipidemia 11/08/2010    Priority: Medium  . Vitamin D deficiency 06/10/2016    Priority: Low  . Health care maintenance 12/31/2013    Priority: Low     Medications: Reconciled today in Epic __________________________________________________________  Physical Exam:  Vital Signs: Vitals:   11/24/18 0825 11/24/18 0849  BP: (!) 159/98 (!) 158/89  Pulse: 80 75  Temp: 97.8 F (36.6 C)   TempSrc: Oral   SpO2: 96%   Weight: 253 lb 4.8 oz (114.9 kg)     Gen: Well appearing, NAD Neck: No cervical LAD, mild thyromegaly with no nodules CV: RRR, no murmurs, distant heart sounds Pulm: Normal effort, CTA throughout, no wheezing Ext: Warm, no edema, normal joints

## 2018-11-24 NOTE — Addendum Note (Signed)
Addended by: Orson Gear on: 11/24/2018 01:33 PM   Modules accepted: Orders

## 2018-11-27 ENCOUNTER — Telehealth: Payer: Self-pay | Admitting: Pharmacist

## 2018-11-27 LAB — FECAL OCCULT BLOOD, IMMUNOCHEMICAL: Fecal Occult Bld: NEGATIVE

## 2018-11-27 NOTE — Telephone Encounter (Signed)
Medications were reviewed with the patient, including name, instructions, indication, goals of therapy, potential side effects, and importance of adherence. Patient reports no side effects, he states his medication adherence deteriorated due to busier work schedule, but he is trying to get back on track. He reports he is able to keep up with morning doses, but is working to remember his evening dose of metformin each day. Advised patient to time evening metformin administration with dinner and he agrees to work on this plan. Also advised patient to contact me if medication-related questions arise. Patient verbalized understanding.

## 2018-11-28 NOTE — Telephone Encounter (Signed)
Thanks so much for your help. 

## 2019-01-01 ENCOUNTER — Encounter: Payer: Self-pay | Admitting: *Deleted

## 2019-02-23 ENCOUNTER — Encounter: Payer: Medicare HMO | Admitting: Student in an Organized Health Care Education/Training Program

## 2019-02-26 ENCOUNTER — Telehealth: Payer: Self-pay | Admitting: Student in an Organized Health Care Education/Training Program

## 2019-02-26 NOTE — Telephone Encounter (Signed)
Due to the COVID-19 pandemic I called Carlos Bowman to do a brief check in phone visit in lieu of our routine visit that was scheduled for Monday.  The patient is a 68 year old person living with diabetes, hypertension, and obesity.  Our last visit in December we discussed his uncontrolled diabetes with an A1c over 13%.  Since then he reports making improved lifestyle modifications.  Reports better compliance with his medication regimen.  Not checking his blood sugars much at home.  Says he will check more fingerstick readings.  Plan to continue with metformin 2000 mg daily.  Continue eating healthier foods, he did meet with our diabetic educator recently.  Follow-up with [name] in 2-3 months when the precautions are lifted.  He told me he recently cut his finger on a piece of barbed wire fencing.  Says that it is healing, did not require medical care.  No worries for infection.  Carlos Bowman has never allowed [name] to give him a vaccine because he is afraid of needles.  I advised that he should have a tetanus shot, but he says this is a rare event and will not happen again.  He declined that vaccine.  Asked him to call the clinic and follow-up with [name] in a few months for routine diabetes care.

## 2019-03-02 ENCOUNTER — Encounter: Payer: Medicare HMO | Admitting: Student in an Organized Health Care Education/Training Program

## 2019-03-18 ENCOUNTER — Other Ambulatory Visit: Payer: Self-pay

## 2019-03-18 DIAGNOSIS — E1169 Type 2 diabetes mellitus with other specified complication: Secondary | ICD-10-CM

## 2019-03-18 MED ORDER — LANCETS MICRO THIN 33G MISC: 3 refills | Status: DC

## 2019-03-18 MED ORDER — GLUCOSE BLOOD VI STRP: ORAL_STRIP | 3 refills | Status: DC

## 2019-03-18 NOTE — Telephone Encounter (Signed)
Request diabetes testing supplies

## 2019-03-18 NOTE — Telephone Encounter (Signed)
Requesting True Metrix test strips and Lancets to be filled @  [place], [place] - [address] [phone] (Phone) [phone] (Fax)

## 2019-03-21 DIAGNOSIS — R69 Illness, unspecified: Secondary | ICD-10-CM | POA: Diagnosis not present

## 2019-03-24 DIAGNOSIS — R69 Illness, unspecified: Secondary | ICD-10-CM | POA: Diagnosis not present

## 2019-03-24 MED ORDER — ONETOUCH VERIO W/DEVICE KIT: PACK | 0 refills | Status: AC

## 2019-03-24 MED ORDER — ONETOUCH DELICA LANCETS 33G MISC: 3 refills | Status: AC

## 2019-03-24 MED ORDER — GLUCOSE BLOOD VI STRP: ORAL_STRIP | 3 refills | Status: DC

## 2019-03-24 NOTE — Telephone Encounter (Signed)
Fax received: pharmacy says insurance requires One Touch and is asking for new meter and supplies.

## 2019-03-24 NOTE — Addendum Note (Signed)
Addended by: Resa Miner on: 03/24/2019 02:57 PM   Modules accepted: Orders

## 2019-04-14 ENCOUNTER — Encounter: Payer: Self-pay | Admitting: Dietician

## 2019-04-26 ENCOUNTER — Other Ambulatory Visit: Payer: Self-pay

## 2019-04-26 ENCOUNTER — Inpatient Hospital Stay (HOSPITAL_COMMUNITY)
Admission: EM | Admit: 2019-04-26 | Discharge: 2019-04-30 | DRG: 194 | Disposition: A | Discharge status: Home / Self Care | Payer: Medicare HMO | Attending: Student in an Organized Health Care Education/Training Program | Admitting: Student in an Organized Health Care Education/Training Program

## 2019-04-26 ENCOUNTER — Encounter (HOSPITAL_COMMUNITY): Payer: Self-pay

## 2019-04-26 ENCOUNTER — Emergency Department (HOSPITAL_COMMUNITY): Payer: Medicare HMO

## 2019-04-26 DIAGNOSIS — J189 Pneumonia, unspecified organism: Secondary | ICD-10-CM | POA: Diagnosis not present

## 2019-04-26 DIAGNOSIS — Z7982 Long term (current) use of aspirin: Secondary | ICD-10-CM | POA: Diagnosis not present

## 2019-04-26 DIAGNOSIS — Z803 Family history of malignant neoplasm of breast: Secondary | ICD-10-CM

## 2019-04-26 DIAGNOSIS — R6510 Systemic inflammatory response syndrome (SIRS) of non-infectious origin without acute organ dysfunction: Secondary | ICD-10-CM

## 2019-04-26 DIAGNOSIS — Z8249 Family history of ischemic heart disease and other diseases of the circulatory system: Secondary | ICD-10-CM | POA: Diagnosis not present

## 2019-04-26 DIAGNOSIS — E785 Hyperlipidemia, unspecified: Secondary | ICD-10-CM | POA: Diagnosis not present

## 2019-04-26 DIAGNOSIS — E872 Acidosis: Secondary | ICD-10-CM | POA: Diagnosis present

## 2019-04-26 DIAGNOSIS — R0602 Shortness of breath: Secondary | ICD-10-CM | POA: Diagnosis present

## 2019-04-26 DIAGNOSIS — I10 Essential (primary) hypertension: Secondary | ICD-10-CM | POA: Diagnosis not present

## 2019-04-26 DIAGNOSIS — N179 Acute kidney failure, unspecified: Secondary | ICD-10-CM | POA: Diagnosis present

## 2019-04-26 DIAGNOSIS — Z20828 Contact with and (suspected) exposure to other viral communicable diseases: Secondary | ICD-10-CM | POA: Diagnosis not present

## 2019-04-26 DIAGNOSIS — Z800 Family history of malignant neoplasm of digestive organs: Secondary | ICD-10-CM

## 2019-04-26 DIAGNOSIS — F17210 Nicotine dependence, cigarettes, uncomplicated: Secondary | ICD-10-CM | POA: Diagnosis present

## 2019-04-26 DIAGNOSIS — E1169 Type 2 diabetes mellitus with other specified complication: Secondary | ICD-10-CM | POA: Diagnosis present

## 2019-04-26 DIAGNOSIS — J181 Lobar pneumonia, unspecified organism: Secondary | ICD-10-CM | POA: Diagnosis not present

## 2019-04-26 DIAGNOSIS — E1165 Type 2 diabetes mellitus with hyperglycemia: Secondary | ICD-10-CM | POA: Diagnosis present

## 2019-04-26 DIAGNOSIS — Z833 Family history of diabetes mellitus: Secondary | ICD-10-CM

## 2019-04-26 DIAGNOSIS — Z801 Family history of malignant neoplasm of trachea, bronchus and lung: Secondary | ICD-10-CM | POA: Diagnosis not present

## 2019-04-26 DIAGNOSIS — R0902 Hypoxemia: Secondary | ICD-10-CM | POA: Diagnosis present

## 2019-04-26 DIAGNOSIS — E871 Hypo-osmolality and hyponatremia: Secondary | ICD-10-CM | POA: Diagnosis present

## 2019-04-26 DIAGNOSIS — R05 Cough: Secondary | ICD-10-CM | POA: Diagnosis not present

## 2019-04-26 DIAGNOSIS — Z7984 Long term (current) use of oral hypoglycemic drugs: Secondary | ICD-10-CM | POA: Diagnosis not present

## 2019-04-26 DIAGNOSIS — R112 Nausea with vomiting, unspecified: Secondary | ICD-10-CM | POA: Diagnosis not present

## 2019-04-26 DIAGNOSIS — Z794 Long term (current) use of insulin: Secondary | ICD-10-CM | POA: Diagnosis not present

## 2019-04-26 LAB — CBC WITH DIFFERENTIAL/PLATELET
Abs Immature Granulocytes: 0.08 10*3/uL — ABNORMAL HIGH (ref 0.00–0.07)
Basophils Absolute: 0 10*3/uL (ref 0.0–0.1)
Basophils Relative: 0 %
Eosinophils Absolute: 0 10*3/uL (ref 0.0–0.5)
Eosinophils Relative: 0 %
HCT: 45 % (ref 39.0–52.0)
Hemoglobin: 14.5 g/dL (ref 13.0–17.0)
Immature Granulocytes: 1 %
Lymphocytes Relative: 4 %
Lymphs Abs: 0.4 10*3/uL — ABNORMAL LOW (ref 0.7–4.0)
MCH: 26.5 pg (ref 26.0–34.0)
MCHC: 32.2 g/dL (ref 30.0–36.0)
MCV: 82.1 fL (ref 80.0–100.0)
Monocytes Absolute: 1 10*3/uL (ref 0.1–1.0)
Monocytes Relative: 12 %
Neutro Abs: 7.2 10*3/uL (ref 1.7–7.7)
Neutrophils Relative %: 83 %
Platelets: 177 10*3/uL (ref 150–400)
RBC: 5.48 MIL/uL (ref 4.22–5.81)
RDW: 13.2 % (ref 11.5–15.5)
WBC: 8.7 10*3/uL (ref 4.0–10.5)
nRBC: 0 % (ref 0.0–0.2)

## 2019-04-26 LAB — RESPIRATORY PANEL BY PCR

## 2019-04-26 LAB — COMPREHENSIVE METABOLIC PANEL
ALT: 27 U/L (ref 0–44)
AST: 25 U/L (ref 15–41)
Albumin: 3.3 g/dL — ABNORMAL LOW (ref 3.5–5.0)
Alkaline Phosphatase: 78 U/L (ref 38–126)
Anion gap: 14 (ref 5–15)
BUN: 16 mg/dL (ref 8–23)
CO2: 20 mmol/L — ABNORMAL LOW (ref 22–32)
Calcium: 9.2 mg/dL (ref 8.9–10.3)
Chloride: 93 mmol/L — ABNORMAL LOW (ref 98–111)
Creatinine, Ser: 1.32 mg/dL — ABNORMAL HIGH (ref 0.61–1.24)
GFR calc Af Amer: >60 mL/min (ref >60)
GFR calc non Af Amer: 54 mL/min — ABNORMAL LOW (ref >60)
Glucose, Bld: 440 mg/dL — ABNORMAL HIGH (ref 70–99)
Potassium: 3.8 mmol/L (ref 3.5–5.1)
Sodium: 127 mmol/L — ABNORMAL LOW (ref 135–145)
Total Bilirubin: 1.4 mg/dL — ABNORMAL HIGH (ref 0.3–1.2)
Total Protein: 6.9 g/dL (ref 6.5–8.1)

## 2019-04-26 LAB — GLUCOSE, CAPILLARY
Glucose-Capillary: 387 mg/dL — ABNORMAL HIGH (ref 70–99)
Glucose-Capillary: 412 mg/dL — ABNORMAL HIGH (ref 70–99)
Glucose-Capillary: 449 mg/dL — ABNORMAL HIGH (ref 70–99)
Glucose-Capillary: 340 mg/dL — ABNORMAL HIGH (ref 70–99)

## 2019-04-26 LAB — URINALYSIS, ROUTINE W REFLEX MICROSCOPIC
Bacteria, UA: NONE SEEN
Bilirubin Urine: NEGATIVE
Glucose, UA: >=500 mg/dL — AB
Ketones, ur: 20 mg/dL — AB
Leukocytes,Ua: NEGATIVE
Nitrite: NEGATIVE
Protein, ur: 100 mg/dL — AB
Specific Gravity, Urine: 1.027 (ref 1.005–1.030)
pH: 5 (ref 5.0–8.0)

## 2019-04-26 LAB — FIBRINOGEN: Fibrinogen: >800 mg/dL — ABNORMAL HIGH (ref 210–475)

## 2019-04-26 LAB — TRIGLYCERIDES: Triglycerides: 284 mg/dL — ABNORMAL HIGH (ref <150)

## 2019-04-26 LAB — LACTATE DEHYDROGENASE: LDH: 176 U/L (ref 98–192)

## 2019-04-26 LAB — INFLUENZA PANEL BY PCR (TYPE A & B)
Influenza A By PCR: NEGATIVE
Influenza B By PCR: NEGATIVE

## 2019-04-26 LAB — LACTIC ACID, PLASMA
Lactic Acid, Venous: 2.4 mmol/L (ref 0.5–1.9)
Lactic Acid, Venous: 2.4 mmol/L (ref 0.5–1.9)

## 2019-04-26 LAB — C-REACTIVE PROTEIN: CRP: 25.9 mg/dL — ABNORMAL HIGH (ref <1.0)

## 2019-04-26 LAB — D-DIMER, QUANTITATIVE: D-Dimer, Quant: 4.27 ug/mL-FEU — ABNORMAL HIGH (ref 0.00–0.50)

## 2019-04-26 LAB — PROCALCITONIN: Procalcitonin: 9.11 ng/mL

## 2019-04-26 LAB — SARS CORONAVIRUS 2 BY RT PCR (HOSPITAL ORDER, PERFORMED IN ~~LOC~~ HOSPITAL LAB): SARS Coronavirus 2: NEGATIVE

## 2019-04-26 LAB — FERRITIN: Ferritin: 751 ng/mL — ABNORMAL HIGH (ref 24–336)

## 2019-04-26 MED ORDER — ACETAMINOPHEN 325 MG PO TABS
650.0000 mg | ORAL_TABLET | Freq: Once | ORAL | Status: AC
  Administered 2019-04-26: 14:00:00 650 mg via ORAL
  Filled 2019-04-26: qty 2

## 2019-04-26 MED ORDER — INSULIN ASPART 100 UNIT/ML ~~LOC~~ SOLN: 3.0000 [IU] | Freq: Three times a day (TID) | SUBCUTANEOUS | Status: DC

## 2019-04-26 MED ORDER — ACETAMINOPHEN 325 MG PO TABS
650.0000 mg | ORAL_TABLET | Freq: Once | ORAL | Status: AC
  Administered 2019-04-26: 23:00:00 650 mg via ORAL
  Filled 2019-04-26: qty 2

## 2019-04-26 MED ORDER — IBUPROFEN 600 MG PO TABS: 600.0000 mg | ORAL_TABLET | Freq: Once | ORAL | Status: DC

## 2019-04-26 MED ORDER — ONDANSETRON HCL 4 MG/2ML IJ SOLN
4.0000 mg | Freq: Once | INTRAMUSCULAR | Status: AC
  Administered 2019-04-26: 4 mg via INTRAVENOUS
  Filled 2019-04-26: qty 2

## 2019-04-26 MED ORDER — INSULIN ASPART 100 UNIT/ML ~~LOC~~ SOLN
0.0000 [IU] | Freq: Three times a day (TID) | SUBCUTANEOUS | Status: DC
  Administered 2019-04-26: 19:00:00 15 [IU] via SUBCUTANEOUS
  Administered 2019-04-27: 11 [IU] via SUBCUTANEOUS
  Administered 2019-04-27: 8 [IU] via SUBCUTANEOUS
  Administered 2019-04-28: 11 [IU] via SUBCUTANEOUS
  Administered 2019-04-28: 8 [IU] via SUBCUTANEOUS
  Administered 2019-04-28: 11 [IU] via SUBCUTANEOUS
  Administered 2019-04-29 (×2): 8 [IU] via SUBCUTANEOUS
  Administered 2019-04-29 – 2019-04-30 (×2): 11 [IU] via SUBCUTANEOUS
  Administered 2019-04-30: 5 [IU] via SUBCUTANEOUS

## 2019-04-26 MED ORDER — ENOXAPARIN SODIUM 40 MG/0.4ML ~~LOC~~ SOLN
40.0000 mg | SUBCUTANEOUS | Status: DC
  Administered 2019-04-26 – 2019-04-29 (×4): 40 mg via SUBCUTANEOUS
  Filled 2019-04-26 (×3): qty 0.4

## 2019-04-26 MED ORDER — SODIUM CHLORIDE 0.9 % IV BOLUS
1000.0000 mL | Freq: Once | INTRAVENOUS | Status: AC
  Administered 2019-04-26: 1000 mL via INTRAVENOUS

## 2019-04-26 MED ORDER — INSULIN ASPART 100 UNIT/ML ~~LOC~~ SOLN
5.0000 [IU] | Freq: Once | SUBCUTANEOUS | Status: AC
  Administered 2019-04-26: 5 [IU] via SUBCUTANEOUS

## 2019-04-26 MED ORDER — SODIUM CHLORIDE 0.9 % IV SOLN
500.0000 mg | Freq: Once | INTRAVENOUS | Status: AC
  Administered 2019-04-26: 18:00:00 500 mg via INTRAVENOUS
  Filled 2019-04-26: qty 500

## 2019-04-26 MED ORDER — ACETAMINOPHEN 325 MG PO TABS
650.0000 mg | ORAL_TABLET | Freq: Four times a day (QID) | ORAL | Status: DC | PRN
  Administered 2019-04-26 – 2019-04-29 (×6): 650 mg via ORAL
  Filled 2019-04-26 (×6): qty 2

## 2019-04-26 MED ORDER — ACETAMINOPHEN 650 MG RE SUPP: 650.0000 mg | Freq: Four times a day (QID) | RECTAL | Status: DC | PRN

## 2019-04-26 MED ORDER — INSULIN ASPART 100 UNIT/ML ~~LOC~~ SOLN
0.0000 [IU] | Freq: Every day | SUBCUTANEOUS | Status: DC
  Administered 2019-04-26 – 2019-04-27 (×2): 4 [IU] via SUBCUTANEOUS
  Administered 2019-04-28: 3 [IU] via SUBCUTANEOUS

## 2019-04-26 MED ORDER — SODIUM CHLORIDE 0.9 % IV SOLN
1.0000 g | Freq: Once | INTRAVENOUS | Status: AC
  Administered 2019-04-26: 15:00:00 1 g via INTRAVENOUS
  Filled 2019-04-26: qty 10

## 2019-04-26 NOTE — ED Provider Notes (Signed)
[facility] EMERGENCY DEPARTMENT Provider Note   CSN: [id number] Arrival date & time: 04/26/19  1256    History   Chief Complaint Chief Complaint  Patient presents with  . Shortness of Breath  . Generalized Body Aches  . Fever  . Abdominal Pain  CC: weakness  HPI Carlos Bowman. is a 68 y.o. male.  He is presenting to the emergency department feeling sick since Wednesday.  He said it started with nausea and vomiting with loose stools.  He says he has diarrhea every time he eats.  He has been feeling generally weaker and he sits gets lightheaded if he moves around.  There is been a nonproductive cough.  No sick contacts or recent travel.  On arrival here he was febrile to 102.7.  Patient denies any fever or chills.  No sore throat runny nose chest pain.  No blood in his stool.  He said he has been urinating frequently but no dysuria  He also has little bit of vague upper abdominal discomfort.     The history is provided by the patient.  Fever  Temp source:  Unable to specify Severity:  Unable to specify Onset quality:  Unable to specify Timing:  Unable to specify Progression:  Unchanged Chronicity:  New Relieved by:  None tried Worsened by:  Nothing Ineffective treatments:  None tried Associated symptoms: cough, diarrhea, ear pain, myalgias, nausea and vomiting   Associated symptoms: no chest pain, no chills, no confusion, no congestion, no dysuria, no rash, no rhinorrhea and no sore throat   Risk factors: no contaminated food, no contaminated water, no hx of cancer, no immunosuppression, no recent sickness, no recent travel and no sick contacts   Abdominal Pain  Pain location:  Epigastric Pain quality: aching   Pain radiates to:  Does not radiate Pain severity:  Mild Onset quality:  Gradual Timing:  Intermittent Progression:  Unchanged Chronicity:  New Relieved by:  None tried Worsened by:  Nothing Ineffective treatments:  None tried Associated  symptoms: cough, diarrhea, fatigue, fever, nausea and vomiting   Associated symptoms: no chest pain, no chills, no dysuria, no hematemesis, no hematochezia, no hematuria, no melena and no sore throat     Past Medical History:  Diagnosis Date  . Diabetes mellitus   . Hyperlipidemia   . Hypertension   . Subclinical hyperthyroidism   . Tobacco use     Patient Active Problem List   Diagnosis Date Noted  . Obesity (BMI 30.0-34.9) 04/29/2017  . Vitamin D deficiency 06/10/2016  . Health care maintenance 12/31/2013  . Hypertension 11/23/2011  . Type 2 diabetes mellitus with other specified complication ([place]) 27/51/7001  . Dyslipidemia 11/08/2010    Past Surgical History:  Procedure Laterality Date  . DENTAL SURGERY    . 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        Home Medications    Prior to Admission medications   Medication Sig Start Date End Date Taking? Authorizing Provider  amLODipine (NORVASC) 10 MG tablet Take 1 tablet (10 mg total) by mouth daily. 11/24/18 11/24/19  Axel Filler, MD  aspirin EC 81 MG tablet Take 1 tablet (81 mg total) by mouth daily. 05/26/15   McLean-Scocuzza, Nino Glow, MD  Blood Glucose Monitoring Suppl [facility] VERIO) w/Device KIT Check blood sugar 1 time a day 03/24/19   Axel Filler, MD  glucose blood [facility] VERIO) test strip Check blood sugar 1 time  a day 03/24/19   Axel Filler, MD  lisinopril (PRINIVIL,ZESTRIL) 40 MG tablet TAKE 1 TABLET BY MOUTH DAILY 07/07/18   Axel Filler, MD  metFORMIN (GLUCOPHAGE) 1000 MG tablet Take 1 tablet (1,000 mg total) by mouth 2 (two) times daily with a meal. 11/24/18   Axel Filler, MD  OneTouch Delica Lancets 40C MISC Use to check blood sugar 1 time a day 03/24/19   Axel Filler, MD    Family History Family History  Problem Relation Age of Onset  . Colon cancer Father        Died at age 9 due to it.  Marland Kitchen Heart disease Mother    . Gout Mother   . Diabetes Sister   . Breast cancer Sister   . Lung cancer Paternal Uncle   . Cancer Paternal Aunt        Unknown cancer    Social History Social History   Tobacco Use  . Smoking status: Current Every Day Smoker    Packs/day: 0.50    Types: Cigarettes  . Tobacco comment: cutting back still smoking 4.5 packs per week  Substance Use Topics  . Alcohol use: No  . Drug use: No     Allergies   Patient has no known allergies.   Review of Systems Review of Systems  Constitutional: Positive for fatigue and fever. Negative for chills.  HENT: Positive for ear pain. Negative for congestion, rhinorrhea and sore throat.   Eyes: Negative for visual disturbance.  Respiratory: Positive for cough.   Cardiovascular: Negative for chest pain.  Gastrointestinal: Positive for abdominal pain, diarrhea, nausea and vomiting. Negative for hematemesis, hematochezia and melena.  Genitourinary: Negative for dysuria and hematuria.  Musculoskeletal: Positive for myalgias.  Skin: Negative for rash.  Neurological: Positive for dizziness and light-headedness.  Psychiatric/Behavioral: Negative for confusion.     Physical Exam Updated Vital Signs BP 132/83 (BP Location: Right Arm)   Pulse (!) 116   Temp (!) 102.7 F (39.3 C) (Oral)   Resp (!) 22   SpO2 92%   Physical Exam Vitals signs and nursing note reviewed.  Constitutional:      Appearance: He is well-developed.  HENT:     Head: Normocephalic and atraumatic.  Eyes:     Conjunctiva/sclera: Conjunctivae normal.  Neck:     Musculoskeletal: Neck supple.  Cardiovascular:     Rate and Rhythm: Regular rhythm. Tachycardia present.     Heart sounds: No murmur.  Pulmonary:     Effort: Pulmonary effort is normal. No respiratory distress.     Breath sounds: Normal breath sounds.  Abdominal:     Palpations: Abdomen is soft.     Tenderness: There is no abdominal tenderness.  Musculoskeletal: Normal range of motion.      Right lower leg: He exhibits no tenderness. Edema present.     Left lower leg: He exhibits no tenderness. Edema present.  Skin:    General: Skin is warm and dry.     Capillary Refill: Capillary refill takes less than 2 seconds.  Neurological:     General: No focal deficit present.     Mental Status: He is alert.      ED Treatments / Results  Labs (all labs ordered are listed, but only abnormal results are displayed) Labs Reviewed  LACTIC ACID, PLASMA - Abnormal; Notable for the following components:      Result Value   Lactic Acid, Venous 2.4 (*)    All other components within  normal limits  CBC WITH DIFFERENTIAL/PLATELET - Abnormal; Notable for the following components:   Lymphs Abs 0.4 (*)    Abs Immature Granulocytes 0.08 (*)    All other components within normal limits  COMPREHENSIVE METABOLIC PANEL - Abnormal; Notable for the following components:   Sodium 127 (*)    Chloride 93 (*)    CO2 20 (*)    Glucose, Bld 440 (*)    Creatinine, Ser 1.32 (*)    Albumin 3.3 (*)    Total Bilirubin 1.4 (*)    GFR calc non Af Amer 54 (*)    All other components within normal limits  D-DIMER, QUANTITATIVE (NOT AT [facility]) - Abnormal; Notable for the following components:   D-Dimer, Quant 4.27 (*)    All other components within normal limits  FERRITIN - Abnormal; Notable for the following components:   Ferritin 751 (*)    All other components within normal limits  TRIGLYCERIDES - Abnormal; Notable for the following components:   Triglycerides 284 (*)    All other components within normal limits  FIBRINOGEN - Abnormal; Notable for the following components:   Fibrinogen >800 (*)    All other components within normal limits  C-REACTIVE PROTEIN - Abnormal; Notable for the following components:   CRP 25.9 (*)    All other components within normal limits  URINALYSIS, ROUTINE W REFLEX MICROSCOPIC - Abnormal; Notable for the following components:   APPearance HAZY (*)    Glucose, UA >=500  (*)    Hgb urine dipstick MODERATE (*)    Ketones, ur 20 (*)    Protein, ur 100 (*)    All other components within normal limits  GLUCOSE, CAPILLARY - Abnormal; Notable for the following components:   Glucose-Capillary 387 (*)    All other components within normal limits  SARS CORONAVIRUS 2 (HOSPITAL ORDER, [place] [facility])  CULTURE, BLOOD (ROUTINE X 2)  CULTURE, BLOOD (ROUTINE X 2)  RESPIRATORY PANEL BY PCR  PROCALCITONIN  LACTATE DEHYDROGENASE  LACTIC ACID, PLASMA  HIV ANTIBODY (ROUTINE TESTING W REFLEX)  INFLUENZA PANEL BY PCR (TYPE A & B)  COMPREHENSIVE METABOLIC PANEL  CBC    EKG EKG Interpretation  Date/Time:  Sunday Apr 26 2019 13:48:07 EDT Ventricular Rate:  114 PR Interval:    QRS Duration: 92 QT Interval:  386 QTC Calculation: 532 R Axis:   -111 Text Interpretation:  Age not entered, assumed to be  68 years old for purpose of ECG interpretation Sinus tachycardia Left anterior fascicular block Probable right ventricular hypertrophy Prolonged QT interval no prior to compare with Confirmed by Aletta Edouard [phone]) on 04/26/2019 3:12:51 PM   Radiology Dg Chest Port 1 View  Result Date: 04/26/2019 CLINICAL DATA:  Cough. EXAM: PORTABLE CHEST 1 VIEW COMPARISON:  July 08, 2014 FINDINGS: Bibasilar infiltrates are identified. The heart, hila, mediastinum, lungs, and pleura are otherwise unremarkable. IMPRESSION: Bibasilar infiltrates. Findings are concerning for pneumonia given history. Recommend follow-up to resolution. Electronically Signed   By: Dorise Bullion III M.D   On: 04/26/2019 13:42    Procedures .Critical Care Performed by: Hayden Rasmussen, MD Authorized by: Hayden Rasmussen, MD   Critical care provider statement:    Critical care time (minutes):  45   Critical care time was exclusive of:  Separately billable procedures and treating other patients   Critical care was necessary to treat or prevent imminent or life-threatening  deterioration of the following conditions:  Sepsis   Critical care  was time spent personally by me on the following activities:  Discussions with consultants, evaluation of patient's response to treatment, examination of patient, ordering and performing treatments and interventions, ordering and review of laboratory studies, ordering and review of radiographic studies, pulse oximetry, re-evaluation of patient's condition, obtaining history from patient or surrogate, review of old charts and development of treatment plan with patient or surrogate   I assumed direction of critical care for this patient from another provider in my specialty: no     (including critical care time)  Medications Ordered in ED Medications  sodium chloride 0.9 % bolus 1,000 mL (has no administration in time range)  acetaminophen (TYLENOL) tablet 650 mg (has no administration in time range)  ondansetron (ZOFRAN) injection 4 mg (has no administration in time range)     Initial Impression / Assessment and Plan / ED Course  I have reviewed the triage vital signs and the nursing notes.  Pertinent labs & imaging results that were available during my care of the patient were reviewed by me and considered in my medical decision making (see chart for details).  Clinical Course as of Apr 26 1831  Sun Apr 26, 2019  1454 Patient's labs starting to come back.  His lactate is elevated at 2.4.  He is given IV fluids infusing.  I have ordered antibiotics for concern for pneumonia on his chest x-ray.  His Covid testing is negative.  His chemistry shows glucose to be elevated at 440 and abnormal sodium of 127 which is likely pseudohyponatremia.  His creatinine is also bumped at 1.32 back from his baseline at 0.9.  Feel patient will need admission for continued management of his symptoms.   [MB]  1455 Differential diagnosis includes Covid, pneumonia, sepsis, gastroenteritis   [MB]  1519 Discussed with Dr. Shan Levans from the internal  medicine resident team who will evaluate the patient for admission.  Updated the patient on the plan for admission and he is agreeable.   [MB]    Clinical Course User Index [MB] Hayden Rasmussen, MD        Final Clinical Impressions(s) / ED Diagnoses   Final diagnoses:  Community acquired pneumonia, unspecified laterality  SIRS (systemic inflammatory response syndrome) [facility])    ED Discharge Orders    None       Hayden Rasmussen, MD 04/26/19 [phone]

## 2019-04-26 NOTE — Progress Notes (Signed)
Pt temp 101.2 oral, Tylenol given at 2127 for temperature of 101, oral. Night coverage notified.

## 2019-04-26 NOTE — H&P (Signed)
Date: 04/26/2019               Patient Name:  Carlos Bowman. MRN: [id number]  DOB: May 12, 1951 Age / Sex: 68 y.o., male   PCP: Axel Filler, MD         Medical Service: Internal Medicine Teaching Service         Attending Physician: Dr. Evette Doffing, Mallie Mussel, *    First Contact: Dr. Myrtie Hawk Pager: [phone]  Second Contact: Dr. Shan Levans Pager: [phone]       After Hours (After 5p/  First Contact Pager: [phone]  weekends / holidays): Second Contact Pager: [phone]   Chief Complaint: Abdominal pain and diarrhea  History of Present Illness: 68 year old male with past medical history of HTN, DM type II, dyslipidemia, presented with abdominal pain, diarrhea, fever, chills and body ache.  He mentions that last Wednesday, he started to cough with some phlegm. It was associated with some shortness of breath.  He then developed abdominal pain and diarrhea.  It was nonbloody loose stool 3 times a day that sometimes was watery and mostly occurred when he ate meat. He endorses having fiver, chills and body ache. He has had some anorexia and sense of tasting was impaired. No headache, dizziness, chest pain. No urinary symptoms. He lives with his wife who is asymptomatic. They have been staying home and did not have any known sick contact. His cough and diarrhea got better today how ever he did not still feel good and came to the ED due as he did not improve.  Meds: Current Meds  Medication Sig  . amLODipine (NORVASC) 10 MG tablet Take 1 tablet (10 mg total) by mouth daily.  Marland Kitchen aspirin EC 81 MG tablet Take 1 tablet (81 mg total) by mouth daily.  . Cholecalciferol (VITAMIN D3) 10 MCG (400 UNIT) CAPS Take 400 Units by mouth daily.  Marland Kitchen lisinopril (PRINIVIL,ZESTRIL) 40 MG tablet TAKE 1 TABLET BY MOUTH DAILY (Patient taking differently: Take 40 mg by mouth daily. )  . metFORMIN (GLUCOPHAGE) 1000 MG tablet Take 1 tablet (1,000 mg total) by mouth 2 (two) times daily with a meal.      Allergies: Allergies as of 04/26/2019  . (No Known Allergies)   Past Medical History:  Diagnosis Date  . Diabetes mellitus   . Hyperlipidemia   . Hypertension   . Subclinical hyperthyroidism   . Tobacco use     Family History:  Family History  Problem Relation Age of Onset  . Colon cancer Father        Died at age 5 due to it.  Marland Kitchen Heart disease Mother   . Gout Mother   . Diabetes Sister   . Breast cancer Sister   . Lung cancer Paternal Uncle   . Cancer Paternal Aunt        Unknown cancer     Social History: Mr. Trueheart lived with his wife in [place]. He is retired. Denies smoking, no illicit drug use, no alcohol use.  Review of Systems: A complete ROS was negative except as per HPI.   Physical Exam: Blood pressure 123/85, pulse 94, temperature 99.8 F (37.7 C), temperature source Oral, resp. rate 20, height 5\' 11"  (1.803 m), weight 99.8 kg, SpO2 95 %. Physical Exam  Constitutional: He is oriented to person, place, and time and well-developed, well-nourished, and in no distress. No distress.  HENT:  Head: Normocephalic and atraumatic.  Eyes: EOM are normal.  Cardiovascular: Normal rate, regular  rhythm and normal heart sounds.  No murmur heard. Pulmonary/Chest: Effort normal. He has no wheezes. He has rales.  Bibasilar fine crackle  Abdominal: Soft. Bowel sounds are normal. There is no abdominal tenderness. There is no guarding.  Musculoskeletal:        General: No edema.  Neurological: He is alert and oriented to person, place, and time.  Skin: Skin is warm and dry.    Significant chemistry:   WBC: 5.4 Na:127 (corrected 132-135) BG: 440 HCO3: 20 Anion gap: 14 Lactic acid:2.4 CRP 25.9 Ferritin 751 D-Dimer 4.27 Fibrinogen>800 LFT: Nl D-Dimer 4.27 ferritin 751 Fibrinogen >800  COVID-19: Nagative  EKG: personally reviewed my interpretation is sinus tachycardia, poor R progression, nonspecific T inversion at V1  CXR: personally reviewed my  interpretation is bibasilar infiltration  Assessment & Plan by Problem: Active Problems:   Pneumonia  68 year old male with past medical history of HTN, DM type II, dyslipidemia, presented with abdominal pain, diarrhea, fever, chills and body ache.  Fever, chills, decreased appetite, diarrhea, abdominal pain and cough: CAP: Febrile on arrival, with lactic acidosis  (mild) and elevated inflammatory markers (CRP 25.9, D-Dimer 4.27, ferritin 751. [Fibrinogen was elevated at>800 so no DIC] and CXR finding suggestive of PNA.  More consistent with viral etiology including COVID-19. Initial test was negative but is still a possibility. Received 1 dose of Ceftriaxone and Azithromycin in ED. Will not continue AB at this point  -Droplet precautions -f/u BC -RVP -Influenza A and B -Cardiac monitoring -CBC tomorrow AM -Repeat lactic acid  AKI: Cr at 1.32 on arrival. (No Hc of CKD).  Received 1 li of NS bolus at ED. -BMP daily  DMII: On Metformin at home. Was hyperglycemic at 440 and glucosuria and Pr uria and ketonuria at 20. Mild non anion gap metabolic acidosis. (no DKA and acidosis likely in setting of lactic acidosis and diarrhea). Pseudo Hyponatremia: Na 127-> Corrected sodium around nl at 132-135.   -Insulin Novolog 5 unit SQ once -Moderate SSI -CBG monitoring -BMP tomorrow AM and daily    HTN : Bp at 130/81 Holding home Lisinopril given AKI -Monitor BP  Diet: Carb modified IV fluid: s/p 1 li NS VTE ppx: Lovenox Code status: Full  Dispo: Admit patient to Inpatient with expected length of stay greater than 2 midnights.  SignedDewayne Hatch, MD 04/26/2019, 6:08 PM  Pager: [phone]

## 2019-04-26 NOTE — ED Notes (Signed)
Please call pts wife Ahamed Hofland for any updates, number listed under pts emergency contact

## 2019-04-26 NOTE — Progress Notes (Signed)
Carlos Bowman [id number] Admission Data: 04/26/2019 7:41 PM Attending Provider: Axel Filler, *  [id number], Mallie Mussel, MD Consults/ Treatment Team:   [name]. is a 68 y.o. male patient admitted from ED awake, alert  & orientated  X 3,  Full Code, VSS - Blood pressure 123/85, pulse 94, temperature 99.8 F (37.7 C), temperature source Oral, resp. rate 20, height 5\' 11"  (1.803 m), weight 99.8 kg, SpO2 95 %., O2    RA  no c/o shortness of breath, no c/o chest pain, no distress noted. Tele # 22 placed and pt is currently running:normal sinus rhythm.   IV site WDL:  antecubital left, condition patent and no redness with a transparent dsg that's clean dry and intact.  Allergies:  No Known Allergies   Past Medical History:  Diagnosis Date  . Diabetes mellitus   . Hyperlipidemia   . Hypertension   . Subclinical hyperthyroidism   . Tobacco use       Pt orientation to unit, room and routine. Information packet given to patient/family and safety video watched.  Admission INP armband ID verified with patient/family, and in place. SR up x 2, fall risk assessment complete with Patient and family verbalizing understanding of risks associated with falls. Pt verbalizes an understanding of how to use the call bell and to call for help before getting out of bed.  Skin, clean-dry- intact without evidence of bruising, or skin tears.   No evidence of skin break down noted on exam. no rashes, no ecchymoses, no petechiae, no nodules, no jaundice, no purpura, no wounds    Will cont to monitor and assist as needed.  [name], RN 04/26/2019 7:41 PM

## 2019-04-26 NOTE — ED Triage Notes (Signed)
Pt states general body aches, fever, and abd pain since Wednesday.  Also c/o shortness of breath

## 2019-04-27 DIAGNOSIS — I10 Essential (primary) hypertension: Secondary | ICD-10-CM

## 2019-04-27 DIAGNOSIS — J189 Pneumonia, unspecified organism: Principal | ICD-10-CM

## 2019-04-27 DIAGNOSIS — E1169 Type 2 diabetes mellitus with other specified complication: Secondary | ICD-10-CM

## 2019-04-27 DIAGNOSIS — Z794 Long term (current) use of insulin: Secondary | ICD-10-CM

## 2019-04-27 LAB — COMPREHENSIVE METABOLIC PANEL
ALT: 23 U/L (ref 0–44)
AST: 21 U/L (ref 15–41)
Albumin: 2.9 g/dL — ABNORMAL LOW (ref 3.5–5.0)
Alkaline Phosphatase: 55 U/L (ref 38–126)
Anion gap: 12 (ref 5–15)
BUN: 14 mg/dL (ref 8–23)
CO2: 21 mmol/L — ABNORMAL LOW (ref 22–32)
Calcium: 8.7 mg/dL — ABNORMAL LOW (ref 8.9–10.3)
Chloride: 97 mmol/L — ABNORMAL LOW (ref 98–111)
Creatinine, Ser: 1.07 mg/dL (ref 0.61–1.24)
GFR calc Af Amer: >60 mL/min (ref >60)
GFR calc non Af Amer: >60 mL/min (ref >60)
Glucose, Bld: 301 mg/dL — ABNORMAL HIGH (ref 70–99)
Potassium: 3.8 mmol/L (ref 3.5–5.1)
Sodium: 130 mmol/L — ABNORMAL LOW (ref 135–145)
Total Bilirubin: 1.2 mg/dL (ref 0.3–1.2)
Total Protein: 6.7 g/dL (ref 6.5–8.1)

## 2019-04-27 LAB — CBC
HCT: 39.3 % (ref 39.0–52.0)
Hemoglobin: 12.7 g/dL — ABNORMAL LOW (ref 13.0–17.0)
MCH: 26.1 pg (ref 26.0–34.0)
MCHC: 32.3 g/dL (ref 30.0–36.0)
MCV: 80.9 fL (ref 80.0–100.0)
Platelets: 172 10*3/uL (ref 150–400)
RBC: 4.86 MIL/uL (ref 4.22–5.81)
RDW: 13.3 % (ref 11.5–15.5)
WBC: 8.6 10*3/uL (ref 4.0–10.5)
nRBC: 0 % (ref 0.0–0.2)

## 2019-04-27 LAB — GLUCOSE, CAPILLARY
Glucose-Capillary: 313 mg/dL — ABNORMAL HIGH (ref 70–99)
Glucose-Capillary: 328 mg/dL — ABNORMAL HIGH (ref 70–99)
Glucose-Capillary: 264 mg/dL — ABNORMAL HIGH (ref 70–99)
Glucose-Capillary: 310 mg/dL — ABNORMAL HIGH (ref 70–99)

## 2019-04-27 LAB — HIV ANTIBODY (ROUTINE TESTING W REFLEX): HIV Screen 4th Generation wRfx: NONREACTIVE

## 2019-04-27 MED ORDER — SODIUM CHLORIDE 0.9 % IV SOLN
1.0000 g | INTRAVENOUS | Status: DC
  Administered 2019-04-27 – 2019-04-29 (×3): 1 g via INTRAVENOUS
  Filled 2019-04-27 (×4): qty 10

## 2019-04-27 MED ORDER — INSULIN ASPART 100 UNIT/ML ~~LOC~~ SOLN
3.0000 [IU] | Freq: Three times a day (TID) | SUBCUTANEOUS | Status: DC
  Administered 2019-04-27 (×2): 3 [IU] via SUBCUTANEOUS

## 2019-04-27 MED ORDER — INSULIN GLARGINE 100 UNIT/ML ~~LOC~~ SOLN
12.0000 [IU] | Freq: Every day | SUBCUTANEOUS | Status: DC
  Administered 2019-04-27: 08:00:00 12 [IU] via SUBCUTANEOUS
  Filled 2019-04-27 (×2): qty 0.12

## 2019-04-27 MED ORDER — DEXTROSE 5 % IV SOLN
250.0000 mg | INTRAVENOUS | Status: DC
  Administered 2019-04-27 – 2019-04-28 (×2): 250 mg via INTRAVENOUS
  Filled 2019-04-27 (×5): qty 250

## 2019-04-27 NOTE — Progress Notes (Signed)
Pt resting in bed comfortably, no signs of acute distress, pt verbalized feeling better.

## 2019-04-27 NOTE — Progress Notes (Signed)
   S: Feeling a little better today.  Less short of breath, left cough.  No abdominal pain.  Eating and drinking well today.  Nausea a little improved.  Still feels weak, struggles with out of bed activity.  O:  Blood pressure 127/76, pulse 86, temperature 99 F (37.2 C), temperature source Oral, resp. rate 20, height 5\' 11"  (1.803 m), weight 101.2 kg, SpO2 94 %. Gen: Weak appearing older man, lying in bed, no distress Heart: Regular rate and rhythm no murmurs Lungs: Unlabored, clear to auscultation throughout, no wheezing or crackles heard Abdomen: Soft, nontender nondistended Extremities: Warm and well-perfused with no edema  A/P: 68 year old person with underlying diabetes and hypertension admitted with community-acquired pneumonia.  Community-acquired pneumonia: Stable clinical condition.  Symptomatically improving slowly.  Requiring 2 L nasal cannula today.  Plan is to continue with ceftriaxone and a azithromycin.  Though the rapid SARS-CoV-2 test was negative yesterday, I still have modest suspicion that this may actually be COVID-19 given the constellation of x-ray findings, inflammatory markers, lymphopenia, and altered olfactory sensation.  Will do a send out COVID-19 test to try to better rule out SARS-CoV-2.  Diabetes: Last A1c was uncontrolled at 13.6% in December.  Blood sugars are elevated today at 300.  Plan is for Lantus 12 units nightly, aspart 3 units with meals, and sliding scale insulin.  Anticipate discharge in 1-2 days pending response to antibiotic therapy.  Lovenox for DVT prophylaxis  Full code   Axel Filler, MD 04/27/2019, 12:04 PM

## 2019-04-27 NOTE — Progress Notes (Addendum)
7334 - Droplet precautions maintained  VS checked. Pt saturating 91-93% on room air but looks uncomfortable and notes dyspnea at rest. Placed on [facility], sat 95%. Lung sounds are clear in the upper lobes with fine crackles in the lower lobes bilaterally and rhonci in RLL. Pt with spontaneous, non productive cough. Encouraged cough and deep breathe, incentive spirometer.  Pt notes overall soreness but denies pain.  BP 127/76 (BP Location: Left Arm)   Pulse 86   Temp 99 F (37.2 C) (Oral)   Resp 20   Ht 5\' 11"  (1.803 m)   Wt 101.2 kg   SpO2 94%   BMI 31.12 kg/m   1030 - orders received, pt to be placed on AIRBORN/Droplet precautions for r/o COVID. Charge RN alerted, bed request in. Door closed. Pt on 2L [place], reports still feels dyspnic at rest.

## 2019-04-28 ENCOUNTER — Encounter (HOSPITAL_COMMUNITY): Payer: Self-pay

## 2019-04-28 DIAGNOSIS — J181 Lobar pneumonia, unspecified organism: Secondary | ICD-10-CM

## 2019-04-28 DIAGNOSIS — E1165 Type 2 diabetes mellitus with hyperglycemia: Secondary | ICD-10-CM

## 2019-04-28 LAB — CBC
HCT: 39.7 % (ref 39.0–52.0)
Hemoglobin: 12.7 g/dL — ABNORMAL LOW (ref 13.0–17.0)
MCH: 26.1 pg (ref 26.0–34.0)
MCHC: 32 g/dL (ref 30.0–36.0)
MCV: 81.7 fL (ref 80.0–100.0)
Platelets: 222 10*3/uL (ref 150–400)
RBC: 4.86 MIL/uL (ref 4.22–5.81)
RDW: 13.8 % (ref 11.5–15.5)
WBC: 12.7 10*3/uL — ABNORMAL HIGH (ref 4.0–10.5)
nRBC: 0 % (ref 0.0–0.2)

## 2019-04-28 LAB — BASIC METABOLIC PANEL
Anion gap: 16 — ABNORMAL HIGH (ref 5–15)
BUN: 11 mg/dL (ref 8–23)
CO2: 21 mmol/L — ABNORMAL LOW (ref 22–32)
Calcium: 9.1 mg/dL (ref 8.9–10.3)
Chloride: 94 mmol/L — ABNORMAL LOW (ref 98–111)
Creatinine, Ser: 1 mg/dL (ref 0.61–1.24)
GFR calc Af Amer: >60 mL/min (ref >60)
GFR calc non Af Amer: >60 mL/min (ref >60)
Glucose, Bld: 292 mg/dL — ABNORMAL HIGH (ref 70–99)
Potassium: 3.6 mmol/L (ref 3.5–5.1)
Sodium: 131 mmol/L — ABNORMAL LOW (ref 135–145)

## 2019-04-28 LAB — HEMOGLOBIN A1C
Hgb A1c MFr Bld: 10.3 % — ABNORMAL HIGH (ref 4.8–5.6)
Mean Plasma Glucose: 248.91 mg/dL

## 2019-04-28 LAB — GLUCOSE, CAPILLARY
Glucose-Capillary: 284 mg/dL — ABNORMAL HIGH (ref 70–99)
Glucose-Capillary: 312 mg/dL — ABNORMAL HIGH (ref 70–99)
Glucose-Capillary: 306 mg/dL — ABNORMAL HIGH (ref 70–99)
Glucose-Capillary: 274 mg/dL — ABNORMAL HIGH (ref 70–99)

## 2019-04-28 LAB — NOVEL CORONAVIRUS, NAA (HOSP ORDER, SEND-OUT TO REF LAB; TAT 18-24 HRS): SARS-CoV-2, NAA: NOT DETECTED

## 2019-04-28 MED ORDER — INSULIN GLARGINE 100 UNIT/ML ~~LOC~~ SOLN
30.0000 [IU] | Freq: Every day | SUBCUTANEOUS | Status: DC
  Administered 2019-04-29: 30 [IU] via SUBCUTANEOUS
  Filled 2019-04-28 (×2): qty 0.3

## 2019-04-28 MED ORDER — INSULIN GLARGINE 100 UNIT/ML ~~LOC~~ SOLN
20.0000 [IU] | Freq: Every day | SUBCUTANEOUS | Status: DC
  Administered 2019-04-28: 20 [IU] via SUBCUTANEOUS
  Filled 2019-04-28: qty 0.2

## 2019-04-28 MED ORDER — INSULIN ASPART 100 UNIT/ML ~~LOC~~ SOLN
10.0000 [IU] | Freq: Three times a day (TID) | SUBCUTANEOUS | Status: DC
  Administered 2019-04-29 (×3): 10 [IU] via SUBCUTANEOUS

## 2019-04-28 MED ORDER — INSULIN GLARGINE 100 UNIT/ML ~~LOC~~ SOLN: 28.0000 [IU] | Freq: Every day | SUBCUTANEOUS | Status: DC

## 2019-04-28 MED ORDER — INSULIN ASPART 100 UNIT/ML ~~LOC~~ SOLN
5.0000 [IU] | Freq: Three times a day (TID) | SUBCUTANEOUS | Status: DC
  Administered 2019-04-28 (×2): 5 [IU] via SUBCUTANEOUS

## 2019-04-28 MED ORDER — INSULIN ASPART 100 UNIT/ML ~~LOC~~ SOLN: 9.0000 [IU] | Freq: Three times a day (TID) | SUBCUTANEOUS | Status: DC

## 2019-04-28 NOTE — Progress Notes (Signed)
   S: States he is feeling a little better today.  Not eating or drinking.  Cough is about the same.  Still short of breath breathing on room air only, comfortable on 2 L nasal cannula.  Able to walk around the room.  O:  Blood pressure 122/70, pulse 70, temperature 98 F (36.7 C), temperature source Oral, resp. rate 19, height 5\' 11"  (1.803 m), weight 101.2 kg, SpO2 96 %. Gen: Tired appearing older man, no distress Lungs: Fine crackles at the right base, clear otherwise, no wheezing Abd: Soft and nontender, nondistended Ext: Warm and well-perfused with no edema  A/P:    Principal Problem:   CAP (community acquired pneumonia) Active Problems:   Type 2 diabetes mellitus with other specified complication ([place])   Community-acquired pneumonia: Mostly right lower lobe based on exam and imaging.  Clinical course is stable, he had a fever to 103 yesterday, continues to spike daily fevers.  Initial rapid SARS-CoV-2 test was negative.  I do still have suspicion that this could be COVID-19 so we are waiting for a confirmatory send out test.  Inflammatory markers on admission were very elevated.  Procalcitonin elevated at 9.  We will continue with antibiotics for community-acquired pneumonia with ceftriaxone and azithromycin, today is day 3 of 5.  Uncontrolled type 2 diabetes: Hyperglycemic this admission with fingersticks around 280-310.  Lantus increased from 12 units to 20 units.  Continue with mealtime aspart and sliding scale.  Lovenox for DVT prophylaxis.  Axel Filler, MD 04/28/2019, 9:59 AM

## 2019-04-28 NOTE — Progress Notes (Signed)
Inpatient Diabetes Program Recommendations  AACE/ADA: New Consensus Statement on Inpatient Glycemic Control (2015)  Target Ranges:  Prepandial:   less than 140 mg/dL      Peak postprandial:   less than 180 mg/dL (1-2 hours)      Critically ill patients:  140 - 180 mg/dL   Lab Results  Component Value Date   GLUCAP 284 (H) 04/28/2019   HGBA1C 13.6 (A) 11/24/2018    Review of Glycemic Control Results for RAEF, SPRIGG (MRN [id number]) as of 04/28/2019 11:17  Ref. Range 11/24/2018 09:09  Hemoglobin A1C Latest Ref Range: 4.0 - 5.6 % 13.6 (A)    Inpatient Diabetes Program Recommendations:    Last A1C from 11/2018, consider repeating?   Thanks, Bronson Curb, MSN, RNC-OB Diabetes Coordinator [phone] (8a-5p)

## 2019-04-29 LAB — COMPREHENSIVE METABOLIC PANEL
ALT: 23 U/L (ref 0–44)
AST: 25 U/L (ref 15–41)
Albumin: 2.8 g/dL — ABNORMAL LOW (ref 3.5–5.0)
Alkaline Phosphatase: 81 U/L (ref 38–126)
Anion gap: 14 (ref 5–15)
BUN: 15 mg/dL (ref 8–23)
CO2: 26 mmol/L (ref 22–32)
Calcium: 9.6 mg/dL (ref 8.9–10.3)
Chloride: 96 mmol/L — ABNORMAL LOW (ref 98–111)
Creatinine, Ser: 0.97 mg/dL (ref 0.61–1.24)
GFR calc Af Amer: >60 mL/min (ref >60)
GFR calc non Af Amer: >60 mL/min (ref >60)
Glucose, Bld: 296 mg/dL — ABNORMAL HIGH (ref 70–99)
Potassium: 3.5 mmol/L (ref 3.5–5.1)
Sodium: 136 mmol/L (ref 135–145)
Total Bilirubin: 0.6 mg/dL (ref 0.3–1.2)
Total Protein: 7.3 g/dL (ref 6.5–8.1)

## 2019-04-29 LAB — GLUCOSE, CAPILLARY
Glucose-Capillary: 309 mg/dL — ABNORMAL HIGH (ref 70–99)
Glucose-Capillary: 291 mg/dL — ABNORMAL HIGH (ref 70–99)
Glucose-Capillary: 285 mg/dL — ABNORMAL HIGH (ref 70–99)
Glucose-Capillary: 162 mg/dL — ABNORMAL HIGH (ref 70–99)

## 2019-04-29 LAB — CBC
HCT: 43.7 % (ref 39.0–52.0)
Hemoglobin: 14 g/dL (ref 13.0–17.0)
MCH: 26.5 pg (ref 26.0–34.0)
MCHC: 32 g/dL (ref 30.0–36.0)
MCV: 82.6 fL (ref 80.0–100.0)
Platelets: 282 10*3/uL (ref 150–400)
RBC: 5.29 MIL/uL (ref 4.22–5.81)
RDW: 14.1 % (ref 11.5–15.5)
WBC: 11 10*3/uL — ABNORMAL HIGH (ref 4.0–10.5)
nRBC: 0 % (ref 0.0–0.2)

## 2019-04-29 LAB — C-REACTIVE PROTEIN: CRP: 28.8 mg/dL — ABNORMAL HIGH (ref <1.0)

## 2019-04-29 LAB — PROCALCITONIN: Procalcitonin: 6.05 ng/mL

## 2019-04-29 MED ORDER — AZITHROMYCIN 250 MG PO TABS
250.0000 mg | ORAL_TABLET | Freq: Every day | ORAL | Status: AC
  Administered 2019-04-29 – 2019-04-30 (×2): 250 mg via ORAL
  Filled 2019-04-29 (×2): qty 1

## 2019-04-29 MED ORDER — INSULIN STARTER KIT- PEN NEEDLES (ENGLISH)
1.0000 | Freq: Once | Status: AC
  Administered 2019-04-29: 1
  Filled 2019-04-29 (×2): qty 1

## 2019-04-29 NOTE — Progress Notes (Signed)
   Subjective: Patient is doing ok. No major complaint. We discussed the plan of care for continuing his antibiotic and also controlling his blood sugar.  Objective:  Vital signs in last 24 hours: Vitals:   04/28/19 1537 04/28/19 2014 04/28/19 2243 04/29/19 0754  BP: 135/69  128/77 121/65  Pulse: 83  86 79  Resp: 18   18  Temp: (!) 100.5 F (38.1 C)  99.7 F (37.6 C) 99.2 F (37.3 C)  TempSrc: Oral  Oral Oral  SpO2: 98% 98%  92%  Weight:      Height:       Physical Exam:  General: Pleasant gentleman, sitting in bed in no acute distress Pulm: no respiratory distress on 2 li nasal supplemental O2, has bilateral crackles more prominent at right middle area Musculoskeletal: no LEE Neurologic exam: Alert and oriented x3  Assessment/Plan:  Principal Problem:   CAP (community acquired pneumonia) Active Problems:   Type 2 diabetes mellitus with other specified complication ([place])   68 y/o male with PMHx of diabetes and hypertension admitted with community-acquired pneumonia.  CAP: COVID-19 came back negative x 2. Pro calcitonin was elevated and fits more with bacterial infection, also developed leukocytosis 2 days ago.  He is on Ceftriaxone and Azithromycin since 5/17. Lung exam with crackles. Had fever yesterday afternoon which is acceptable since is within first 72 h of starting treatment. He is still requiring 2 li supplemental O2 through nasal canula and not ready to discharge  -Continue Azithromycin and Ceftriaxone (switching IV Azithromycin to PO, to avoid giving Dextrose with IV Azithromycin because of hyperglycemia)   Uncontrolled DM II:  Still hyperglycemic (CBG ranging between 284-312). Will increase Insulin dose. HbA1c 10. He is afraid of needle and has been hesitant to start Insulin -Increase Lantus to 30 units at night -Increasing Novolog to 10 units with meals, -Continue SSI -CBG monitoring  Diet: Carb modified IV fluid: None VTE ppx: Lovenox Code status:  Full  Dispo: Anticipated discharge in approximately 1-2 day  Carlos Hatch, MD 04/29/2019, 1:14 PM Pager: [phone]

## 2019-04-29 NOTE — Care Management Important Message (Signed)
Important Message  Patient Details  Name: Carlos Bowman. MRN: [id number] Date of Birth: 13-Nov-1951   Medicare Important Message Given:  Yes    [name], LCSW 04/29/2019, 4:09 PM

## 2019-04-29 NOTE — Progress Notes (Signed)
Patient alert and oriented x 4, denies pain with assess.  Low grade fever noted, provided acetaminophen.  Afebrile with follow up.  Rested throughout shift with no respiratory distress noted.  Will continue to monitor.

## 2019-04-29 NOTE — Consult Note (Signed)
   [facility] CM Inpatient Consult   04/29/2019  Carlos Bowman. December 09, 1951 [id number]   We have reviewed your referral request for Diabetes Management A1C 10.3. Chart has been reviewed and below is the MD's history and physical notes from 04/26/2019:  68 year old person living with diabetes and hypertension admitted with community-acquired pneumonia.  Clinical course has been developing over the last 5 days.  He is having fevers, myalgias, nausea, elevated inflammatory markers, lymphopenia, altered taste and smell sensation.  Chest x-ray has bilateral lower lobe opacities consistent with pneumonia.  Rapid SARS-CoV-2 test was negative.  I agree with the plan to treat for community-acquired pneumonia with ceftriaxone and azithromycin.  Based on his constellation of symptoms, I think COVID-19 is still a modest possibility.  We will do a send out swab to try to confirm if this was truly ruled out.  Given his underlying conditions and age he would be at high risk for a poor outcome if he does have COVID-19. - Lalla Brothers, MD [facility] with the patient via hospital telephone and verified HIPAA.  Patient is short of breath when speaking with him and he states he is not feel well with this pneumonia.  He states he is not thinking as "sharp" as usual but agrees to [place] [facility] follow up stating, "as long as Dr. Evette Doffing thinks it will help me I am all for it."  Patient states he drives and he is the person that takes care of his affairs.  Will follow progress and assign to [facility] for post hospital needs.  Patient state his pharmacy is [facility] and currently not having any issues with medications.  For questions, please contact:  Natividad Brood, RN BSN [facility] Liaison  [phone] business mobile phone Toll free office [phone]  Fax number: [phone] [email or web address]

## 2019-04-29 NOTE — Progress Notes (Signed)
Inpatient Diabetes Program Recommendations  AACE/ADA: New Consensus Statement on Inpatient Glycemic Control (2015)  Target Ranges:  Prepandial:   less than 140 mg/dL      Peak postprandial:   less than 180 mg/dL (1-2 hours)      Critically ill patients:  140 - 180 mg/dL   Lab Results  Component Value Date   GLUCAP 309 (H) 04/29/2019   HGBA1C 10.3 (H) 04/28/2019    Review of Glycemic Control Results for Carlos Bowman, Carlos Bowman (MRN [id number]) as of 04/29/2019 14:15  Ref. Range 04/28/2019 15:35 04/28/2019 22:41 04/29/2019 07:49  Glucose-Capillary Latest Ref Range: 70 - 99 mg/dL 306 (H) 274 (H) 309 (H)   Diabetes history: type 2 DM Outpatient Diabetes medications: Metformin 1000 mg BID Current orders for Inpatient glycemic control: Novolog 0-15 units TID, novolog 0-5 units QHS, Lantus 30 units QD, Novolog 10 units TID  Inpatient Diabetes Program Recommendations:    Spoke with patient regarding outpatient management of diabetes. Patient states, "Since December, I have worked hard to manage my diabetes better. I am active and take care of things at home and have worked to eliminate sweets from my diet." Reviewed patient's current A1c of 10.3% down from 13% in December 2019. Explained what a A1c is and what it measures. Also reviewed goal A1c with patient, importance of good glucose control @ home, and blood sugar goals. Reviewed patho of DM, role of pancreas, need for insulin, action of Jardiance, survival skills, impact of infection to CBGs, when to call MD, vascular changes and comorbidites. Patient has a meter and uses it daily. He reports CBGs > 200 in the last few days. Encouraged to continue checking 2-3 times per day and focus on a FSBS and 2 hour post prandial, and at bedtime. Reviewed the target goals for blood sugars and when to call MD.  Discussed patient's feelings on using insulin as an outpatient. Patient is open to doing what he needs to "get better". Reviewed when insulin is usually  started and the importance of being an advocate for his care. Encouraged patient to begin working with nurses during injections times to watch and ask questions and informed he is able to inject himself. Will order insulin starter kit in the event insulin is started at discharge.  As of December, patient has eliminated sugary foods from his diet. He splurges on diet ginger ale. Denies drinking other sodas or consuming large amounts of carbohydrates. Patient is independent and helps to provide care to his wife. Stressed the importance of reaching out to MD to schedule another appointment.  Has no further questions at this time.  Thanks, Bronson Curb, MSN, RNC-OB Diabetes Coordinator [phone] (8a-5p)

## 2019-04-30 DIAGNOSIS — Z7984 Long term (current) use of oral hypoglycemic drugs: Secondary | ICD-10-CM

## 2019-04-30 LAB — CBC
HCT: 39 % (ref 39.0–52.0)
Hemoglobin: 12.6 g/dL — ABNORMAL LOW (ref 13.0–17.0)
MCH: 26.3 pg (ref 26.0–34.0)
MCHC: 32.3 g/dL (ref 30.0–36.0)
MCV: 81.4 fL (ref 80.0–100.0)
Platelets: 312 10*3/uL (ref 150–400)
RBC: 4.79 MIL/uL (ref 4.22–5.81)
RDW: 14.1 % (ref 11.5–15.5)
WBC: 9.2 10*3/uL (ref 4.0–10.5)
nRBC: 0 % (ref 0.0–0.2)

## 2019-04-30 LAB — COMPREHENSIVE METABOLIC PANEL
ALT: 32 U/L (ref 0–44)
AST: 43 U/L — ABNORMAL HIGH (ref 15–41)
Albumin: 2.5 g/dL — ABNORMAL LOW (ref 3.5–5.0)
Alkaline Phosphatase: 70 U/L (ref 38–126)
Anion gap: 14 (ref 5–15)
BUN: 16 mg/dL (ref 8–23)
CO2: 23 mmol/L (ref 22–32)
Calcium: 9.1 mg/dL (ref 8.9–10.3)
Chloride: 99 mmol/L (ref 98–111)
Creatinine, Ser: 0.88 mg/dL (ref 0.61–1.24)
GFR calc Af Amer: >60 mL/min (ref >60)
GFR calc non Af Amer: >60 mL/min (ref >60)
Glucose, Bld: 228 mg/dL — ABNORMAL HIGH (ref 70–99)
Potassium: 3.2 mmol/L — ABNORMAL LOW (ref 3.5–5.1)
Sodium: 136 mmol/L (ref 135–145)
Total Bilirubin: 0.6 mg/dL (ref 0.3–1.2)
Total Protein: 6.3 g/dL — ABNORMAL LOW (ref 6.5–8.1)

## 2019-04-30 LAB — GLUCOSE, CAPILLARY
Glucose-Capillary: 235 mg/dL — ABNORMAL HIGH (ref 70–99)
Glucose-Capillary: 304 mg/dL — ABNORMAL HIGH (ref 70–99)

## 2019-04-30 LAB — MAGNESIUM: Magnesium: 2 mg/dL (ref 1.7–2.4)

## 2019-04-30 MED ORDER — INSULIN GLARGINE 100 UNIT/ML ~~LOC~~ SOLN
36.0000 [IU] | Freq: Every day | SUBCUTANEOUS | Status: DC
  Administered 2019-04-30: 36 [IU] via SUBCUTANEOUS
  Filled 2019-04-30: qty 0.36

## 2019-04-30 MED ORDER — CEFDINIR 300 MG PO CAPS: 300.0000 mg | ORAL_CAPSULE | Freq: Two times a day (BID) | ORAL | 0 refills | Status: AC

## 2019-04-30 MED ORDER — SODIUM CHLORIDE 0.9 % IV SOLN
1.0000 g | INTRAVENOUS | Status: DC
  Administered 2019-04-30: 1 g via INTRAVENOUS
  Filled 2019-04-30: qty 10

## 2019-04-30 MED ORDER — INSULIN GLARGINE 100 UNIT/ML ~~LOC~~ SOLN: 36.0000 [IU] | Freq: Every day | SUBCUTANEOUS | 11 refills | Status: DC

## 2019-04-30 MED ORDER — INSULIN DEGLUDEC 200 UNIT/ML ~~LOC~~ SOPN: 36.0000 [IU] | PEN_INJECTOR | Freq: Two times a day (BID) | SUBCUTANEOUS | 0 refills | Status: DC

## 2019-04-30 MED ORDER — AZITHROMYCIN 250 MG PO TABS: 250.0000 mg | ORAL_TABLET | Freq: Every day | ORAL | 0 refills | Status: DC

## 2019-04-30 MED ORDER — INSULIN ASPART 100 UNIT/ML ~~LOC~~ SOLN
12.0000 [IU] | Freq: Three times a day (TID) | SUBCUTANEOUS | Status: DC
  Administered 2019-04-30 (×2): 12 [IU] via SUBCUTANEOUS

## 2019-04-30 MED ORDER — POTASSIUM CHLORIDE CRYS ER 20 MEQ PO TBCR
40.0000 meq | EXTENDED_RELEASE_TABLET | Freq: Once | ORAL | Status: AC
  Administered 2019-04-30: 40 meq via ORAL
  Filled 2019-04-30: qty 2

## 2019-04-30 MED FILL — AZITHROMYCIN 250 MG TABLET: 250 | 2 days supply | Qty: 2 | Fill #0

## 2019-04-30 MED FILL — CEFDINIR 300 MG CAPSULE: 300 | 2 days supply | Qty: 4 | Fill #0

## 2019-04-30 NOTE — Progress Notes (Signed)
SATURATION QUALIFICATIONS: (This note is used to comply with regulatory documentation for home oxygen)  Patient Saturations on Room Air at Rest = 96  Patient Saturations on Room Air while Ambulating = 94   

## 2019-04-30 NOTE — Plan of Care (Signed)
  RD consulted for nutrition education regarding diabetes.   Lab Results  Component Value Date   HGBA1C 10.3 (H) 04/28/2019   **[place]. Discussed the diabetes diet with patient and pt with very little questions for RD. Not interested in receiving handouts. Pt reports having decreased his consumption of sweets and sweetened beverages. Also states he noticed an increase in his blood sugar when he had poor appetite and wasn't eating at all. Encouraged pt to consume consistent meals and in times of poor appetite to include diabetes friendly protein shakes in diet such as Glucerna, Boost Glucose control or sugar-free Carnation Instant Breakfast packets.  Expect fair compliance. May benefit more from outpatient education when possible in the community.  Body mass index is 31.12 kg/m. Pt meets criteria for obesity based on current BMI.  Current diet order is CHO modified, patient is consuming approximately 100% of meals at this time. Labs and medications reviewed. No further nutrition interventions warranted at this time.  If additional nutrition issues arise, please re-consult RD.  Clayton Bibles, MS, RD, [place] Dietitian Pager: [phone] After Hours Pager: [phone]

## 2019-04-30 NOTE — Discharge Summary (Signed)
Name: Quientin Jent. MRN: [id number] DOB: 12-Nov-1951 68 y.o. PCP: Axel Filler, MD  Date of Admission: 04/26/2019 12:57 PM Date of Discharge: 04/30/2019 Attending Physician: No att. providers found  Discharge Diagnosis: 1. Principal Problem:   CAP (community acquired pneumonia) Active Problems:   Type 2 diabetes mellitus with other specified complication [place])   Discharge Medications: Allergies as of 04/30/2019   No Known Allergies     Medication List    TAKE these medications   amLODipine 10 MG tablet Commonly known as:  NORVASC Take 1 tablet (10 mg total) by mouth daily.   aspirin EC 81 MG tablet Take 1 tablet (81 mg total) by mouth daily.   azithromycin 250 MG tablet Commonly known as:  Zithromax Take 1 tablet (250 mg total) by mouth daily. Take 1 tablet daily for 3 days.   cefdinir 300 MG capsule Commonly known as:  OMNICEF Take 1 capsule (300 mg total) by mouth 2 (two) times daily for 2 days.   glucose blood test strip Commonly known as:  OneTouch Verio Check blood sugar 1 time a day   Insulin Degludec 200 UNIT/ML Sopn Commonly known as:  [place] FlexTouch Inject 36 Units into the skin 2 (two) times a day.   lisinopril 40 MG tablet Commonly known as:  ZESTRIL TAKE 1 TABLET BY MOUTH DAILY   metFORMIN 1000 MG tablet Commonly known as:  GLUCOPHAGE Take 1 tablet (1,000 mg total) by mouth 2 (two) times daily with a meal.   OneTouch Delica Lancets 33A Misc Use to check blood sugar 1 time a day   OneTouch Verio w/Device Kit Check blood sugar 1 time a day   Vitamin D3 10 MCG (400 UNIT) Caps Take 400 Units by mouth daily.       Disposition and follow-up:   Mr.Desiree Ashrith Sagan. was discharged from [facility] in Stable condition.  At the hospital follow up visit please address:  1.  Patient admitted for CAP. Discharges with 2 more days of antibiotic (total course of 7 days) with Azithromycin and Cefdinir.   2. Patient  with uncontrolled DM II, started on Tresiba 36 u BID at discharge. Please monitor blood sugar and adjust as needed.  Labs / imaging needed at time of follow-up: BMP   Pending labs/ test needing follow-up: none  Follow-up Appointments: 5/28 at [place] by problem list:  1.  Community-acquired pneumonia: Mr. Littles is a 68y/o malewithPMHx of type 2 diabetes and hypertension, who presented with fever, cough, altered taste.  He was hypoxic on arrival and required 2 L of mental oxygen via nasal cannula.  His lab results were significant for elevated lactic acid at 2.4,  elevated inflammatory markers, lymphopenia.  His chest x-ray showed diffuse opacity mostly at right lower lobe, he was admitted lobe community-acquired pneumonia.  His initial COVID-19 test came back negative.  Procalcitonin was elevated at 9, patient started on ceftriaxone and azithromycin.  His clinical presentation was still concerning for COVID, however second COVID-19 test (send-out) was also negative. RVP and Influenza A and B were negative.  He gradually improved with 5 days of IV Ceftriaxon and Azithromycin. His uncontrolled diabetes was likley play a role in his slow improvement initially. He remained afebrile for past 2 days before discharge, and his leukocytosis resolved. He tolerated ambulation, and saturating at 95% at room air at discharge.  He is discharge with 2 more days of antibiotics.   Uncontrolled DM II: Mr.  [place] been hyperglycemic during this hospitalization. (CBG ranging between 2 280-310) required 12 u of Novolog TID and 36 units of Lantus at bedtime as well as sliding scale. Patient has been on Metformin 1000 mg BID at home. Mr. Brannock was not interested in using any needle for treatment. His Hb A1c is 10. He is now convinced that this is important to control his blood sugar and is willing to try pen Insulin. He is discharged with 40 u of Tresiba 200u/ml BID in addition to home dose of Metformin  1000 mg BID. He will need close follow up with adjusting his diabetes regimen.  Discharge Vitals:   BP 130/79 (BP Location: Right Arm)   Pulse 81   Temp 98.5 F (36.9 C) (Oral)   Resp 16   Ht _0  (1.803 m)   Wt 101.2 kg   SpO2 95%   BMI 31.12 kg/m   Pertinent Labs, Studies, and Procedures:  CXR: 04/26/2019 Bibasilar infiltrates are identified. The heart, hila, mediastinum, lungs, and pleura are otherwise unremarkable. Bibasilar infiltrates. Findings are concerning for pneumonia given history. Recommend follow-up to resolution.  Fibrinogen  >800 Ferritin        751 Lactic acid 2.4  04/27/2019 04/26/2019  SARS-CoV-2, NAA NOT DETECTED NOT DETECTED   RVP:                                                                      Negative Influenza A By PCR  NEGATIVE   Influenza B By PCR  NEGATIVE        04/29/19 04/26/2019  CRP <1.0 mg/dL 28.8High   25.9High  CM        Procalcitonin ng/mL 6.05  9.11 CM    Hb A1c: 10.3 (04/28/2019) Component     Latest Ref Rng & Units 07/08/2014 05/26/2015 12/01/2015 06/07/2016  Glucose     70 - 99 mg/dL 109 (H) 120 (H) 124 (H) 137 (H)   Component     Latest Ref Rng & Units 04/29/2017 04/21/2018 04/26/2019 04/27/2019  Glucose     70 - 99 mg/dL 130 (H) 177 (H) 440 (H) 301 (H)   Component     Latest Ref Rng & Units 04/28/2019 04/29/2019 04/30/2019  Glucose     70 - 99 mg/dL 292 (H) 296 (H) 228 (H)      Discharge Instructions: Discharge Instructions    AMB Referral to [facility]   Complete by:  As directed    Please assign to community nurse for complex care and disease management for diabetes with Hgb A1C is 10.3. follow up calls and assess for further needs.  Questions please call:   Natividad Brood, RN BSN [facility] Liaison  [phone] business mobile phone Toll free office [phone]  Fax number: [phone] [email or web address]   Reason for consult:   Complex disease management referral - hospital dietician   Diagnoses of:  Diabetes   Expected date of contact:  1-3 days (reserved for hospital discharges)   Ambulatory referral to Nutrition and Diabetic Education   Complete by:  As directed    Diet - low sodium heart healthy   Complete by:  As directed    Discharge instructions   Complete by:  As directed    Thank you for allowing [name] taking care of you at [facility]. We are glad that you feel better. Please finish antibiotic course. I prescribe Insulin for you. Please inject it as instructed and follow up with your primary care within a week. Contact physician if having any issue with injection or if developed dizziness, lightheadedness, headache or nausea. Thanks   Increase activity slowly   Complete by:  As directed       Signed: [name], MD 05/01/2019, 1:32 PM   Pager: [phone]

## 2019-04-30 NOTE — Progress Notes (Signed)
Patient second test results for Covid received from lab with negative results noted.  Internal medicine contacted and provided orders to transfer patient off covid unit.  Patient and wife made aware.  Transferred to 2w-15. Stable condition at time of transfer.  No respiratory distress noted during shift.  Will continue to monitor.

## 2019-04-30 NOTE — Progress Notes (Signed)
Inpatient Diabetes Program Recommendations  AACE/ADA: New Consensus Statement on Inpatient Glycemic Control (2015)  Target Ranges:  Prepandial:   less than 140 mg/dL      Peak postprandial:   less than 180 mg/dL (1-2 hours)      Critically ill patients:  140 - 180 mg/dL   Lab Results  Component Value Date   GLUCAP 235 (H) 04/30/2019   HGBA1C 10.3 (H) 04/28/2019    Review of Glycemic Control Results for Carlos Bowman, Carlos Bowman (MRN [id number]) as of 04/30/2019 09:33  Ref. Range 04/29/2019 11:10 04/29/2019 16:31 04/29/2019 21:18 04/30/2019 07:40  Glucose-Capillary Latest Ref Range: 70 - 99 mg/dL 291 (H) 285 (H) 162 (H) 235 (H)   Diabetes history: type 2 DM Outpatient Diabetes medications: Metformin 1000 mg BID Current orders for Inpatient glycemic control: Novolog 0-15 units TID, novolog 0-5 units QHS, Lantus 36 units QD, Novolog 12 units TID  Inpatient Diabetes Program Recommendations:    Spoke with team regarding disposition. Plan for DC today. Discussed recommendation to include Levemir in addition to oral agents, given insulin needs while inpatient, A1C, and patient has been attempting lifestyle changes since December. Plan for insulin pens and pen needles.   Spoke with patient regarding plan to start insulin. Patient is hopeful that insulin will not be permanent and has fear of needles. However, states, "I know what I have to do, even if I don't like it." Is open towards working with injections.  Reviewed again with him insulin pen, rotating sites, and what he will need. Encouraged to continue practicing with RN prior to discharge. Reviewed Levemir with patient, action, benefits, when to call MD and survival skills.  Will plan to have team review insulin pen prior to discharge in addition to working with nursing staff.  Thanks, Bronson Curb, MSN, RNC-OB Diabetes Coordinator [phone] (8a-5p)

## 2019-04-30 NOTE — Progress Notes (Signed)
Inpatient Diabetes Program Recommendations  AACE/ADA: New Consensus Statement on Inpatient Glycemic Control (2015)  Target Ranges:  Prepandial:   less than 140 mg/dL      Peak postprandial:   less than 180 mg/dL (1-2 hours)      Critically ill patients:  140 - 180 mg/dL   Lab Results  Component Value Date   GLUCAP 304 (H) 04/30/2019   HGBA1C 10.3 (H) 04/28/2019    Met with patient in person to demonstrate use of insulin pen.  Patient states that he has been sticking himself and feels good about it.  Demonstrated insulin pen use including applying needle, 2 unit prime, dialing dose, injection, holding in place for 6-10 seconds, and disposal of needles.  Patient verbalized understanding and states "this is easy".  He was able to hold pen and practiced dialing dose.  We discussed hypoglycemia signs, symptoms and treatment.  Explained rule of 15 for treatment of hypoglycemia and explained that he should drink 4 oz of juice or regular soda.  He asked about peanut butter for treatment of low blood sugar.  Explained that due to the fat/protein content, peanut butter would not be ideal to treat an immediate low blood sugar (however could be used as a snack). Also told patient to check blood sugars 3 times a day, write down, and take results to MD visits.  Patient verbalized understanding. He is eager to get home to wife.     Thanks,  Adah Perl, RN, BC-ADM Inpatient Diabetes Coordinator Pager [phone] (8a-5p)

## 2019-04-30 NOTE — Progress Notes (Signed)
   Subjective: Patient was seen and evaluated at bedside on morning rounds. No acute events overnight.  He mentions that he feels better, shortness of breath is improved. No further fever. We recommended isolation after discharge . We discussed about uncontrolled diabetes might had a role in slow improvement and recovery of pneumonia. We discussed importance of controlling his blood sugar.  He continues to be not interested in getting injection treatment at home.  Objective:  Vital signs in last 24 hours: Vitals:   04/29/19 0754 04/29/19 1600 04/29/19 2123 04/30/19 0015  BP: 121/65 117/63 134/77 120/77  Pulse: 79 92 86 74  Resp: 18   16  Temp: 99.2 F (37.3 C) 99.8 F (37.7 C) 99 F (37.2 C) 98.7 F (37.1 C)  TempSrc: Oral Oral Oral Oral  SpO2: 92% 96% 98% 94%  Weight:      Height:       CV: RRR, normal S1-S2 Pulm: no respiratory distress on room air, still has faint crackles (improved today) musculoskeletal: no LEE Neurologic exam: Alert and oriented x3  Assessment/Plan:  Principal Problem:   CAP (community acquired pneumonia) Active Problems:   Type 2 diabetes mellitus with other specified complication ([place])  68 y/o male with PMHx of diabetes and hypertension admitted with community-acquired pneumonia.  CAP: Patient is on ceftriaxone and azithromycin (today is day 5 of ceftriaxone) Did not have any fever since yesterday and leukocytosis resolved. He saturating 95% at room air currently.  -Ambulatory pulse oximetery -Continue Azithromycin and Ceftriaxone  -If saturating well with ambulation, may discharge home with azithromycin and Omnicef   Uncontrolled DM II:  Still hyperglycemic. CBG: 160-300 -Increase Lantus to 36 units at night -Increasing Novolog to 12 units with meals -Continue SSI -CBG monitoring -Recommedning Insulin, initially refused any injection.  If remains not agreeable will add another oral agent (jaurdiance, vs semaglutide,..) to his current  regimen at DC .  Diet: Carb modified IV fluid: None  VTE ppx: Lovenox Code status: Full   Dispo: Anticipated discharge likely today if pass ambulation eval and after DM meds clarification   [name], MD 04/30/2019, 5:37 AM Pager: [phone]

## 2019-05-01 ENCOUNTER — Other Ambulatory Visit: Payer: Self-pay | Admitting: *Deleted

## 2019-05-01 LAB — CULTURE, BLOOD (ROUTINE X 2)
Culture: NO GROWTH
Culture: NO GROWTH
Special Requests: ADEQUATE
Special Requests: ADEQUATE

## 2019-05-01 NOTE — Patient Outreach (Signed)
[facility]) Care Management  05/01/2019  Carlos Bowman [birth year] [id number]   Referral received from hospital liaison as member was recently admitted to hospital for community acquired pneumonia, Covid test was negative.  While in hospital, noted that member's A1C was elevated, referral request for assistance with management of diabetes.  Primary MD office will complete transition of care assessment.  Per chart, member also has history of hypertension and dyslipidemia.  Call placed to member, identity verified.  This care manager introduced self and stated purpose of call.  [facility] care management services explained.  He report he lives with his wife, but he is the primary caretaker in the home.  He prepares the meals and transportation for he and his wife to MD appointments and to run errands.  He manages his own medications, state he is receiving samples of Tresiba from the MD office due to the cost.    He has been checking blood sugars, today was 240.  Report he is aware of the appropriate diet he should be using but had not been totally adherent over the past several months.  State he has changed his habits over the last few months, A1C already decreased from 13.6 Dec. 2019 to 10.12 Apr 2019.  Has follow up with primary MD next week, denies need for transportation assistance.    Denies any urgent concerns, will follow up within the next 2 weeks.  THN CM Care Plan Problem One     Most Recent Value  Care Plan Problem One  Knowledge deficit regarding diabetes management as evidenced by elevated A1C  Role Documenting the Problem One  Care Management [place] for Problem One  Active  THN Long Term Goal   Member's A1C will be decreased </= 8.5 within the nexft 3 months  THN Long Term Goal Start Date  05/01/19  Interventions for Problem One Long Term Goal  Discharge instructions reviewed with member, including diet and daily monitoring of blood sugars.  EMMI  education regarding diabetes diet and management of condition assigned and mailed to member.  THN CM Short Term Goal #1   Member will report compliance with medications, especially diabetic meds, over the next 4 weeks  THN CM Short Term Goal #1 Start Date  05/01/19  Interventions for Short Term Goal #1  Medications reviewed, referral placed to pharmacy regarding assistance with insulin  THN CM Short Term Goal #2   Member will keep and attend visit with primary MD within the next week.  THN CM Short Term Goal #2 Start Date  05/01/19  Interventions for Short Term Goal #2  Upcoming appointment reviewed with member, assessed the need for transportation assistance.     [name], [place], MSN [place] [phone]

## 2019-05-04 ENCOUNTER — Encounter: Payer: Self-pay | Admitting: *Deleted

## 2019-05-05 ENCOUNTER — Encounter: Payer: Self-pay | Admitting: Pharmacist

## 2019-05-05 ENCOUNTER — Other Ambulatory Visit: Payer: Self-pay | Admitting: Pharmacist

## 2019-05-05 NOTE — Patient Outreach (Addendum)
[facility]) Care Management  [place]   05/05/2019  Laymond Postle 1951-05-27 [id number]  Reason for referral: medication assistance  Referral source: [facility] Nurse Referral medication(s): Tyler Aas Current insurance:[employer]  PMHx:  68 year old male with type 2 diabetes, hyperlipidemia, obesity, and hypertension.  He was recently admitted with community-acquired pneumonia. Rapid SARS-CoV-2 test was negative.  Patient was called regarding medication assistance with Tyler Aas per referral. HIPAA identifiers were obtained. Patient stated that he did not need medication assistance as he planned to discontinue insulin therapy and maintain a strict diet and oral therapy.   Objective: No Known Allergies  Medications Reviewed Today    Reviewed by Elayne Guerin, [facility] (Pharmacist) on 05/05/19 at 1357  Med List Status: <None>  Medication Order Taking? Sig Documenting Provider Last Dose Status Informant  amLODipine (NORVASC) 10 MG tablet [id number] Yes Take 1 tablet (10 mg total) by mouth daily. Axel Filler, MD Taking Active Spouse/Significant Other  aspirin EC 81 MG tablet [id number] Yes Take 1 tablet (81 mg total) by mouth daily. McLean-Scocuzza, Nino Glow, MD Taking Active Spouse/Significant Other        Discontinued 05/05/19 1355 (Completed Course)   Blood Glucose Monitoring Suppl [facility] VERIO) w/Device KIT [id number] Yes Check blood sugar 1 time a day Axel Filler, MD Taking Active Spouse/Significant Other  Cholecalciferol (VITAMIN D3) 10 MCG (400 UNIT) CAPS [id number] Yes Take 400 Units by mouth daily. [provider] Taking Active Spouse/Significant Other  glucose blood (ONETOUCH VERIO) test strip [id number] Yes Check blood sugar 1 time a day Axel Filler, MD Taking Active Spouse/Significant Other  Insulin Degludec (TRESIBA FLEXTOUCH) 200 UNIT/ML SOPN [id number] Yes Inject 36 Units into the skin 2 (two) times a day. Masoudi,  Elhamalsadat, MD Taking Active   lisinopril (PRINIVIL,ZESTRIL) 40 MG tablet [id number] Yes TAKE 1 TABLET BY MOUTH DAILY  Patient taking differently:  Take 40 mg by mouth daily.    Axel Filler, MD Taking Active Spouse/Significant Other  metFORMIN (GLUCOPHAGE) 1000 MG tablet [id number] Yes Take 1 tablet (1,000 mg total) by mouth 2 (two) times daily with a meal. Axel Filler, MD Taking Active Spouse/Significant Other  OneTouch Delica Lancets 60A MISC [id number] Yes Use to check blood sugar 1 time a day Axel Filler, MD Taking Active Spouse/Significant Other          ASSESSMENT: Date Discharged from Hospital: 04/30/2019 Date Medication Reconciliation Performed: 05/06/2019  Medications:  New at Discharge: . Azithromycin (completed therapy) . Cefidinir(Completed therapy) . Tyler Aas  Patient was recently discharged from hospital and all medications have been reviewed.  Drugs sorted by system:  Cardiovascular: Aspirin, Lisinopril, Amlodipine,    Endocrine: Tyler Aas (not taking), Metformin,   Vitamins/Minerals/Supplements: Cholecalciferol  Medication Review Findings:  . Adherence-patient reported that he is planning on stopping insulin therapy with [place]. Current HgA1c-10.3% . With patient's A1c, insulin therapy is the most efficient way to decrease his A1c further.  o Patient said insulin caused "knots in his stomach". Patient reported rotating injection sites but was adamant about stopping therapy. o GLP-1 therapy may be an option but cost may be an issue as well.   Medication Assistance Findings:  No medication assistance needs identified (Patient refused medication assistance assessment)    Additional medication assistance options reviewed with patient as warranted:  No other options identified  Plan: Route note to patient's PCP. Close patient's pharmacy case as he was not interested in Pharmacy Assistance. Will gladly reopen case upon  request.  Elayne Guerin, PharmD, [place] Clinical Pharmacist [phone]

## 2019-05-07 ENCOUNTER — Ambulatory Visit (INDEPENDENT_AMBULATORY_CARE_PROVIDER_SITE_OTHER): Payer: Medicare HMO | Admitting: Internal Medicine

## 2019-05-07 ENCOUNTER — Encounter: Payer: Self-pay | Admitting: Internal Medicine

## 2019-05-07 ENCOUNTER — Other Ambulatory Visit: Payer: Self-pay

## 2019-05-07 VITALS — BP 131/80 | HR 89 | Temp 98.2°F | Ht 71.0 in | Wt 222.3 lb

## 2019-05-07 DIAGNOSIS — J189 Pneumonia, unspecified organism: Secondary | ICD-10-CM

## 2019-05-07 DIAGNOSIS — E1169 Type 2 diabetes mellitus with other specified complication: Secondary | ICD-10-CM

## 2019-05-07 DIAGNOSIS — I10 Essential (primary) hypertension: Secondary | ICD-10-CM

## 2019-05-07 DIAGNOSIS — Z79899 Other long term (current) drug therapy: Secondary | ICD-10-CM | POA: Diagnosis not present

## 2019-05-07 DIAGNOSIS — Z8701 Personal history of pneumonia (recurrent): Secondary | ICD-10-CM

## 2019-05-07 DIAGNOSIS — Z7984 Long term (current) use of oral hypoglycemic drugs: Secondary | ICD-10-CM

## 2019-05-07 MED ORDER — EMPAGLIFLOZIN-METFORMIN HCL 5-1000 MG PO TABS: 1.0000 | ORAL_TABLET | Freq: Two times a day (BID) | ORAL | 3 refills | Status: DC

## 2019-05-07 NOTE — Assessment & Plan Note (Signed)
Improving. Completed his antibiotics.  -We will repeat chest x-ray in 4 to 6 weeks to see the complete resolution.

## 2019-05-07 NOTE — Patient Instructions (Signed)
Thank you for visiting clinic today.   I am making some changes to your diabetic medication as you do not want to continue with injection. I am giving you another medicine called empagliflozin and combined it with your Metformin to make it 1 pill, so you will continue taking 1 tablet twice daily. You will continue with your blood pressure medicines as you were doing it before. Please follow-up in 1 month with your PCP and bring your glucometer with you during that appointment.  Empagliflozin; Metformin oral tablets What is this medicine? EMPAGLIFLOZIN; METFORMIN (EM pa gli FLOE zin; met FOR min) is a combination of 2 medicines used to treat type 2 diabetes. This medicine lowers blood sugar. Treatment is combined with a balanced diet and exercise. This medicine may be used for other purposes; ask your health care provider or pharmacist if you have questions. COMMON BRAND NAME(S): Synjardy What should I tell my health care provider before I take this medicine? They need to know if you have any of these conditions: -anemia -dehydration -diabetic ketoacidosis -diet low in salt -eating less due to illness, surgery, dieting, or any other reason -having surgery -heart disease -high cholesterol -history of pancreatitis or pancreas problems -history of yeast infection of the penis or vagina -if you often drink alcohol -infections in the bladder, kidneys, or urinary tract -kidney disease -liver disease -low blood pressure -polycystic ovary syndrome -problems urinating -serious infection or injury -type 1 diabetes -uncircumcised male -vomiting -an unusual or allergic reaction to empagliflozin, metformin, other medicines, foods, dyes, or preservatives -pregnant or trying to get pregnant -breast-feeding How should I use this medicine? Take this medicine by mouth with a glass of water. Take this medicine with food. Follow the directions on the prescription label. Take your doses at regular  intervals. Do not take your medicine more often than directed. Do not stop taking except on your doctor's advice. A special MedGuide will be given to you by the pharmacist with each prescription and refill. Be sure to read this information carefully each time. Talk to your pediatrician regarding the use of this medicine in children. Special care may be needed. Overdosage: If you think you have taken too much of this medicine contact a poison control center or emergency room at once. NOTE: This medicine is only for you. Do not share this medicine with others. What if I miss a dose? If you miss a dose, take it as soon as you can. If it is almost time for your next dose, take only that dose. Do not take double or extra doses. What may interact with this medicine? Do not take this medicine with any of the following medications: -certain contrast medicines given before X-rays, CT scans, MRI, or other procedures -dofetilide This medicine may also interact with the following medications: -acetazolamide -alcohol -certain antivirals for HIV or hepatitis -certain medicines for blood pressure, heart disease, irregular heart beat -cimetidine -dichlorphenamide -digoxin -diuretics -male hormones, like estrogens or progestins and birth control pills -glycopyrrolate -isoniazid -lamotrigine -memantine -methazolamide -metoclopramide -midodrine -niacin -phenothiazines like chlorpromazine, mesoridazine, prochlorperazine, thioridazine -phenytoin -ranolazine -steroid medicines like prednisone or cortisone -stimulant medicines for attention disorders, weight loss, or to stay awake -thyroid medicines -topiramate -trospium -vandetanib -zonisamide This list may not describe all possible interactions. Give your health care provider a list of all the medicines, herbs, non-prescription drugs, or dietary supplements you use. Also tell them if you smoke, drink alcohol, or use illegal drugs. Some items may  interact with your medicine. What  should I watch for while using this medicine? Visit your doctor or health care professional for regular checks on your progress. This medicine can cause a serious condition in which there is too much acid in the blood. If you develop nausea, vomiting, stomach pain, unusual tiredness, or breathing problems, stop taking this medicine and call your doctor right away. If possible, use a ketone dipstick to check for ketones in your urine. A test called the HbA1C (A1C) will be monitored. This is a simple blood test. It measures your blood sugar control over the last 2 to 3 months. You will receive this test every 3 to 6 months. Learn how to check your blood sugar. Learn the symptoms of low and high blood sugar and how to manage them. Always carry a quick-source of sugar with you in case you have symptoms of low blood sugar. Examples include hard sugar candy or glucose tablets. Make sure others know that you can choke if you eat or drink when you develop serious symptoms of low blood sugar, such as seizures or unconsciousness. They must get medical help at once. Tell your doctor or health care professional if you have high blood sugar. You might need to change the dose of your medicine. If you are sick or exercising more than usual, you might need to change the dose of your medicine. Do not skip meals. Ask your doctor or health care professional if you should avoid alcohol. Many nonprescription cough and cold products contain sugar or alcohol. These can affect blood sugar. This medicine may cause ovulation in premenopausal women who do not have regular monthly periods. This may increase your chances of becoming pregnant. You should not take this medicine if you become pregnant or think you may be pregnant. Talk with your doctor or health care professional about your birth control options while taking this medicine. Contact your doctor or health care professional right away if you  think you are pregnant. If you are going to need surgery, a MRI, CT scan, or other procedure, tell your doctor that you are taking this medicine. You may need to stop taking this medicine before the procedure. Wear a medical ID bracelet or chain, and carry a card that describes your disease and details of your medicine and dosage times. This medicine may cause a decrease in folic acid and vitamin B12. You should make sure that you get enough vitamins while you are taking this medicine. Discuss the foods you eat and the vitamins you take with your health care professional. What side effects may I notice from receiving this medicine? Side effects that you should report to your doctor or health care professional as soon as possible: -allergic reactions like skin rash, itching or hives, swelling of the face, lips, or tongue -breathing problems -dizziness -feeling faint or lightheaded, falls -muscle aches or pains -muscle weakness -penile discharge, itching, or pain in men -signs and symptoms of a genital infection, such as fever; tenderness, redness, or swelling in the genitals or area from the genitals to the back of the rectum -signs and symptoms of low blood sugar such as feeling anxious, confusion, dizziness, increased hunger, unusually weak or tired, sweating, shakiness, cold, irritable, headache, blurred vision, fast heartbeat, loss of consciousness -signs and symptoms of a urinary tract infection, such as fever, chills, a burning feeling when urinating, blood in the urine, back pain -trouble passing urine or change in the amount of urine, including an urgent need to urinate more often, in larger  amounts, or at night -slow or irregular heartbeat -unusual stomach pain or discomfort -unusually tired or weak -vaginal discharge, itching, or odor in women -vomiting Side effects that usually do not require medical attention (report to your doctor or health care professional if they continue or are  bothersome): -diarrhea -headache -heartburn -metallic taste in mouth -mild increase in urination -nausea -stomach gas, upset -thirsty This list may not describe all possible side effects. Call your doctor for medical advice about side effects. You may report side effects to FDA at 1-800-FDA-1088. Where should I keep my medicine? Keep out of the reach of children. Store at room temperature between 15 and 30 degrees C (59 and 86 degrees F). Throw away any unused medicine after the expiration date. NOTE: This sheet is a summary. It may not cover all possible information. If you have questions about this medicine, talk to your doctor, pharmacist, or health care provider.  2019 Elsevier/Gold Standard (2018-01-03 11:56:31)

## 2019-05-07 NOTE — Assessment & Plan Note (Addendum)
Patient has uncontrolled diabetes and was started on Tresiba on discharge. He does not want to continue with injectable and would like to have some oral pills. He did bring his glucometer with him for the past 10 days which shows an average glucose of 196, 23% within target range.  -Discontinue Tresiba as patient is not taking it. -Start him on empagliflozin, gave him a new prescription of combination pill metformin-empagliflozin. We can titrate the dose if needed. -Follow-up with PCP in 1 month with glucometer.  Addendum.  Talked with the patient and he was concerned about the cost of combination pill, you would like to take both medications separately as they were cheaper.  Sent new prescriptions for metformin and empagliflozin.

## 2019-05-07 NOTE — Progress Notes (Signed)
   CC: Hospital follow-up.  HPI:  Mr.Carlos Bowman. is a 68 y.o. with past medical history as listed below came to the clinic for hospital follow-up of his recent hospitalization.  He was admitted at [facility] from 5/17-5/21/2020 due to community-acquired pneumonia.  COVID-19 testing twice was negative.  He was treated with ceftriaxone and azithromycin and sent home with 2 more days of antibiotics.  Patient is doing well since discharge.  His cough and shortness of breath has been improved.  He finished a full course of antibiotics.  Denies any more fevers.  Denies chest pain or exertional dyspnea.  His appetite has been normalized.  Please see assessment and plan for his chronic conditions.  Past Medical History:  Diagnosis Date  . Diabetes mellitus   . Hyperlipidemia   . Hypertension   . Subclinical hyperthyroidism   . Tobacco use    Review of Systems: Negative except mentioned in HPI.  Physical Exam:  Vitals:   05/07/19 0919  BP: 131/80  Pulse: 89  Temp: 98.2 F (36.8 C)  TempSrc: Oral  SpO2: 98%  Weight: 222 lb 4.8 oz (100.8 kg)  Height: 5\' 11"  (1.803 m)   General: Vital signs reviewed.  Patient is well-developed and well-nourished, in no acute distress and cooperative with exam.  Head: Normocephalic and atraumatic. Eyes: EOMI, conjunctivae normal, no scleral icterus.  Cardiovascular: RRR, S1 normal, S2 normal, no murmurs, gallops, or rubs. Pulmonary/Chest: Clear to auscultation bilaterally, no wheezes, rales, or rhonchi. Abdominal: Soft, non-tender, non-distended, BS +, Extremities: No lower extremity edema bilaterally,  pulses symmetric and intact bilaterally. No cyanosis or clubbing. Skin: Warm, dry and intact. No rashes or erythema. Psychiatric: Normal mood and affect. speech and behavior is normal.   Assessment & Plan:   See Encounters Tab for problem based charting.  Patient discussed with Dr. Dareen Piano.

## 2019-05-07 NOTE — Assessment & Plan Note (Addendum)
BP Readings from Last 3 Encounters:  05/07/19 131/80  04/30/19 130/79  11/24/18 (!) 158/89   Blood pressure was within goal.  Denies any dizziness.  -Continue with current management. -Repeat BMP today as he was mildly hypokalemic on previous BMP.  Addendum.  Repeat BMP shows potassium on the higher end at 5.3.  Called the patient and he is not taking any supplement.  Patient has normal kidney functions so no action required at this time.  We can repeat BMP during next follow-up visit.

## 2019-05-08 DIAGNOSIS — R69 Illness, unspecified: Secondary | ICD-10-CM | POA: Diagnosis not present

## 2019-05-08 LAB — BMP8+ANION GAP
Anion Gap: 16 mmol/L (ref 10.0–18.0)
BUN/Creatinine Ratio: 10 (ref 10–24)
BUN: 10 mg/dL (ref 8–27)
CO2: 25 mmol/L (ref 20–29)
Calcium: 10 mg/dL (ref 8.6–10.2)
Chloride: 99 mmol/L (ref 96–106)
Creatinine, Ser: 0.97 mg/dL (ref 0.76–1.27)
GFR calc Af Amer: 91 mL/min/{1.73_m2} (ref >59)
GFR calc non Af Amer: 79 mL/min/{1.73_m2} (ref >59)
Glucose: 83 mg/dL (ref 65–99)
Potassium: 5.3 mmol/L — ABNORMAL HIGH (ref 3.5–5.2)
Sodium: 140 mmol/L (ref 134–144)

## 2019-05-08 MED ORDER — EMPAGLIFLOZIN 10 MG PO TABS: 10.0000 mg | ORAL_TABLET | Freq: Every day | ORAL | 3 refills | Status: DC

## 2019-05-08 MED ORDER — METFORMIN HCL 1000 MG PO TABS: 1000.0000 mg | ORAL_TABLET | Freq: Two times a day (BID) | ORAL | 11 refills | Status: DC

## 2019-05-08 NOTE — Addendum Note (Signed)
Addended by: Lorella Nimrod on: 05/08/2019 09:22 AM   Modules accepted: Orders

## 2019-05-08 NOTE — Progress Notes (Signed)
Internal Medicine Clinic Attending  Case discussed with Dr. Amin at the time of the visit.  We reviewed the resident's history and exam and pertinent patient test results.  I agree with the assessment, diagnosis, and plan of care documented in the resident's note.    

## 2019-05-11 ENCOUNTER — Encounter: Payer: Medicare HMO | Admitting: Student in an Organized Health Care Education/Training Program

## 2019-05-14 ENCOUNTER — Encounter: Payer: Self-pay | Admitting: *Deleted

## 2019-05-14 ENCOUNTER — Other Ambulatory Visit: Payer: Self-pay | Admitting: *Deleted

## 2019-05-14 NOTE — Patient Outreach (Signed)
[place] [facility]) Care Management  05/14/2019  Carlos Bowman. June 02, 1951 [id number]   Call placed to member to follow up on diabetes management.  He report he is doing well, feels like he is progressing with effectively managing his diabetes.  State his blood sugar today was 153 prior to eating breakfast.  Reviewed blood sugar trends between since discharge, range from a low of 97 to a high of 167.  He report he is aware of how to manage condition, state he was "going through a lot" over the past several months and let his stress get the best of him by becoming non-adherent to his diet.  He has since restarted healthy eating habits and will restart exercising soon.  This care manager inquired about members decision to stop injections and new order for diabetic medications.  He report he has made the decision due to financial concern, not being able to afford the medication.  He has not started empagliflozin due to the cost.  Discussed having [facility] pharmacist work around resources for assistance, state he would only consider if he can get medication for free.  State he has no copay for all other medications and this would be the only way he would consider taking the new one.  He denies any urgent concerns at this time.  Advised to contact this care manager with questions.  Will collaborate with [facility] pharmacist for assistance, will follow up with member within the next month.  Fall Risk  05/14/2019 05/07/2019 11/24/2018 01/20/2018 10/21/2017  Falls in the past year? 0 0 0 No No  Number falls in past yr: - - - - -  Risk Factor Category  - - - - -  Risk for fall due to : - - - - -  Risk for fall due to: Comment - - - - -  Follow up - Falls prevention discussed - - -   Depression screen [facility] 2/9 05/14/2019 05/07/2019 11/24/2018 01/20/2018 10/21/2017  Decreased Interest 0 0 0 - 0  Down, Depressed, Hopeless 0 0 0 1 0  PHQ - 2 Score 0 0 0 1 0  Altered sleeping - 0 3 - -  Tired, decreased energy - 0  2 - -  Change in appetite - 0 2 - -  Feeling bad or failure about yourself  - 0 0 - -  Trouble concentrating - 0 0 - -  Moving slowly or fidgety/restless - 0 0 - -  Suicidal thoughts - 0 0 - -  PHQ-9 Score - 0 7 - -  Difficult doing work/chores - Not difficult at all Somewhat difficult - -   THN CM Care Plan Problem One     Most Recent Value  Care Plan Problem One  Knowledge deficit regarding diabetes management as evidenced by elevated A1C  Role Documenting the Problem One  Care Management Coordinator  Care Plan for Problem One  Active  THN Long Term Goal   Member's A1C will be decreased </= 8.5 within the nexft 3 months  THN Long Term Goal Start Date  05/01/19  Interventions for Problem One Long Term Goal  Complications of uncontrolled diabetes discussed with member.  Educated on how daily management of diet helps control diabetes and lower A1C  THN CM Short Term Goal #1   Member will report compliance with medications, especially diabetic meds, over the next 4 weeks  THN CM Short Term Goal #1 Start Date  05/01/19  Interventions for Short Term Goal #1  Medications reviewed, discussed importance of taking newly prescribed medications and possiblity of new pharmacy referral  THN CM Short Term Goal #2   Member will keep and attend visit with primary MD within the next week.  THN CM Short Term Goal #2 Start Date  05/01/19  [facility] CM Short Term Goal #2 Met Date  05/14/19     [name], RN, MSN [place] Manager [phone]

## 2019-05-15 ENCOUNTER — Encounter: Payer: Self-pay | Admitting: *Deleted

## 2019-05-22 ENCOUNTER — Other Ambulatory Visit: Payer: Self-pay | Admitting: Pharmacist

## 2019-05-22 NOTE — Patient Outreach (Addendum)
[facility]) Care Management  [place]   05/22/2019  Carlos Bowman February 07, 1951 [id number]  Reason for referral: Medication Assistance, Medication Review  Referral source: [facility] RN Current insurance: [employer]  PMHx includes but not limited to:   68 year old male with type 2 diabetes, hyperlipidemia, obesity, and hypertension.  Outreach:  Successful telephone call with patient.  HIPAA identifiers verified.   Subjective:  Patient reports his Jardiance copay was >$200 and that was cost prohibitive    Objective: The 10-year ASCVD risk score [name] [name]., et al., 2013) is: 39.2%   Values used to calculate the score:     Age: 68 years     Sex: Male     Is Non-Hispanic African American: Yes     Diabetic: Yes     Tobacco smoker: No     Systolic Blood Pressure: 916 mmHg     Is BP treated: Yes     HDL Cholesterol: 36 mg/dL     Total Cholesterol: 251 mg/dL  Lab Results  Component Value Date   CREATININE 0.97 05/07/2019   CREATININE 0.88 04/30/2019   CREATININE 0.97 04/29/2019    Lab Results  Component Value Date   HGBA1C 10.3 (H) 04/28/2019    Lipid Panel     Component Value Date/Time   CHOL 251 (H) 06/07/2016 1519   TRIG 284 (H) 04/26/2019 1400   TRIG 390 10/22/2010   HDL 36 (L) 06/07/2016 1519   CHOLHDL 7.0 (H) 06/07/2016 1519   CHOLHDL 9.3 05/26/2015 1607   VLDL 52 (H) 05/26/2015 1607   LDLCALC 168 (H) 06/07/2016 1519    BP Readings from Last 3 Encounters:  05/07/19 131/80  04/30/19 130/79  11/24/18 (!) 158/89    No Known Allergies  Medications Reviewed Today    Reviewed by Elayne Guerin, [place] (Pharmacist) on 05/22/19 at 0926  Med List Status: <None>  Medication Order Taking? Sig Documenting Provider Last Dose Status Informant  amLODipine (NORVASC) 10 MG tablet [id number] Yes Take 1 tablet (10 mg total) by mouth daily. Axel Filler, MD Taking Active Spouse/Significant Other  aspirin EC 81 MG tablet [id number] Yes Take 1  tablet (81 mg total) by mouth daily. McLean-Scocuzza, Nino Glow, MD Taking Active Spouse/Significant Other  Blood Glucose Monitoring Suppl [facility] VERIO) w/Device Drucie Opitz [id number] Yes Check blood sugar 1 time a day Axel Filler, MD Taking Active Spouse/Significant Other  Cholecalciferol (VITAMIN D3) 10 MCG (400 UNIT) CAPS [id number] Yes Take 400 Units by mouth daily. [provider] Taking Active Spouse/Significant Other  empagliflozin (JARDIANCE) 10 MG TABS tablet [id number]  Take 10 mg by mouth daily. Lorella Nimrod, MD  Active   glucose blood [facility] VERIO) test strip [id number] Yes Check blood sugar 1 time a day Axel Filler, MD Taking Active Spouse/Significant Other  lisinopril (PRINIVIL,ZESTRIL) 40 MG tablet [id number] Yes TAKE 1 TABLET BY MOUTH DAILY  Patient taking differently: Take 40 mg by mouth daily.    Axel Filler, MD Taking Active Spouse/Significant Other  metFORMIN (GLUCOPHAGE) 1000 MG tablet [id number] Yes Take 1 tablet (1,000 mg total) by mouth 2 (two) times daily with a meal. Lorella Nimrod, MD Taking Active   OneTouch Delica Lancets 73V MISC [id number] Yes Use to check blood sugar 1 time a day Axel Filler, MD Taking Active Spouse/Significant Other          Assessment: Drugs sorted by system:  Cardiovascular: Aspirin, Lisinopril, Amlodipine,    Endocrine: Metformin, Jardiance  Vitamins/Minerals/Supplements: Cholecalciferol  HgA1c 10.3% Jardiance recently added Patient not on statin but chart documentation of myalgias.   Medication Assistance Findings:  -Applied patient for Extra Help -Patient MAY qualify to receive      Additional medication assistance options reviewed with patient as warranted:  No other options identified   Plan: . Assisted patient with applying for Extra Help.  . I will route patient assistance letter to [place] technician who will coordinate patient assistance program application  process for medications listed above.  [facility] pharmacy technician will assist with obtaining all required documents from both patient and provider(s) and submit application(s) once completed.  . Will route note to PCP.  Marland Kitchen Will follow-up in 4-6 weeks.    Elayne Guerin, PharmD, [place] Clinical Pharmacist [phone]

## 2019-05-26 ENCOUNTER — Other Ambulatory Visit: Payer: Self-pay | Admitting: Pharmacy Technician

## 2019-05-26 NOTE — Patient Outreach (Signed)
[facility] Care Management  05/26/2019  Jael Kostick. 04-23-1951 [id number]                           Medication Assistance Referral  Referral From: [place]  Medication/Company: Vania Rea / BI Patient application portion:  Mailed Provider application portion: Faxed  to Dr. Lalla Brothers    Follow up:  Will follow up with patient in 5-10 business days to confirm application(s) have been received.  Zela Sobieski P. Smera Guyette, [place] Management [phone]

## 2019-06-03 ENCOUNTER — Other Ambulatory Visit: Payer: Self-pay | Admitting: Pharmacy Technician

## 2019-06-03 NOTE — Patient Outreach (Signed)
[facility]) Care Management  06/03/2019  Carlos Bowman. 01/16/1951 [id number]   Successful outreach call placed to patient in regards to [facility] application for Jardiance.  Spoke to patient, HIPAA identifiers verified.  Patient informed he had received the application in the mail. He informed he had to contact the social security department to have them mail out a copy of his wife's benefits statement as they had misplaced her statement. Patient informed he would place in the mail once he receives the information. Patient denied having any other questions about the application process.  Will followup with patient in 10-14 business days if application has not been received.  Akram Kissick P. Kinser Fellman, [place] Management [phone]

## 2019-06-10 ENCOUNTER — Other Ambulatory Visit: Payer: Self-pay | Admitting: *Deleted

## 2019-06-10 NOTE — Patient Outreach (Addendum)
[place]) Care Management  06/10/2019  Carlos Bowman. Mar 24, 1951 [id number]   Call placed to member to follow up on diabetes management.  He report he is doing well but state his blood sugars over the past week or so has been increased.  Reported readings below:  6/30 - 224 6/28 - 221 6/27 - 223 6/24 - 181 7 day average - 196 14 day average - 162 30 day average - 155  He state he is trying to manage his diet better but admits that he has had some stressors in the past month.  He has been out of work and not receiving needed income to pay monthly bills.  State his unemployment has not started and he is optimistic that his stressors will be relieved.  Denies needing assistance from Education officer, museum, confirms that he has requested information to complete paperwork for medication assistance for Jardiance.  He will follow up with both social services and J. Simcox, CPht regarding assistance.    Will continue to monitor blood sugars daily, has follow up appointment with primary MD on 7/13.  Will follow up with member within the next 2 weeks.   THN CM Care Plan Problem One     Most Recent Value  Care Plan Problem One  Knowledge deficit regarding diabetes management as evidenced by elevated A1C  Role Documenting the Problem One  Care Management [place] for Problem One  Active  THN Long Term Goal   Member's A1C will be decreased </= 8.5 within the nexft 3 months  THN Long Term Goal Start Date  05/01/19  Interventions for Problem One Long Term Goal  Re-educated on complications of uncontrolled diabetes and potential risk factors of increased blood sugars, such as stress  THN CM Short Term Goal #1   Member will report compliance with medications, especially diabetic meds, over the next 4 weeks  THN CM Short Term Goal #1 Start Date  05/01/19  [facility] CM Short Term Goal #1 Met Date  06/10/19  THN CM Short Term Goal #2   Member will have paperwork completed for  medication assistance within the next 2 weeks  THN CM Short Term Goal #2 Start Date  06/10/19  Interventions for Short Term Goal #2  Provided with [facility] pharmacy tech contact information, advised to follow up regarding paperwork submission      [name], RN, MSN [place] Manager [phone]

## 2019-06-22 ENCOUNTER — Other Ambulatory Visit: Payer: Self-pay

## 2019-06-22 ENCOUNTER — Other Ambulatory Visit: Payer: Self-pay | Admitting: Pharmacy Technician

## 2019-06-22 ENCOUNTER — Ambulatory Visit (INDEPENDENT_AMBULATORY_CARE_PROVIDER_SITE_OTHER): Payer: Medicare HMO | Admitting: Student in an Organized Health Care Education/Training Program

## 2019-06-22 ENCOUNTER — Encounter: Payer: Self-pay | Admitting: Student in an Organized Health Care Education/Training Program

## 2019-06-22 VITALS — BP 135/75 | HR 83 | Temp 98.2°F | Ht 71.0 in | Wt 216.2 lb

## 2019-06-22 DIAGNOSIS — Z8701 Personal history of pneumonia (recurrent): Secondary | ICD-10-CM | POA: Diagnosis not present

## 2019-06-22 DIAGNOSIS — Z79899 Other long term (current) drug therapy: Secondary | ICD-10-CM | POA: Diagnosis not present

## 2019-06-22 DIAGNOSIS — I10 Essential (primary) hypertension: Secondary | ICD-10-CM

## 2019-06-22 DIAGNOSIS — Z7984 Long term (current) use of oral hypoglycemic drugs: Secondary | ICD-10-CM | POA: Diagnosis not present

## 2019-06-22 DIAGNOSIS — E1169 Type 2 diabetes mellitus with other specified complication: Secondary | ICD-10-CM

## 2019-06-22 NOTE — Patient Instructions (Signed)
Today we talked about your diabetes.  It is too early to check your A1c right now.  I am going to talk with my pharmacist to see if we can find another option for a additional diabetes medicine which will be better covered by her insurance.  Overall I do still think your diabetes is not well controlled, we need to do better.  We talked about your diet and exercise.  Keep up the great work.  Continue to eat mostly fresh fruits, vegetables, whole grains, and liquids.  Continue your other medications for your blood pressure.

## 2019-06-22 NOTE — Patient Outreach (Signed)
[place] [facility]) Care Management  06/22/2019  Carlos Bowman. 03/24/1951 [id number]   Second successful outreach call placed to patient in regards to [facility] application for Jardiance.  Spoke to patient, HIPAA identifiers verified.  [name] informed he was still awaiting the social security statement for his wife. He requested that it be mailed again as he has not received anything in the mail. He was informed they will mail it to him.  Patient also informed that he had a visit with his provider this morning and was explaining his hardship. He informed his provider is thinking about changing his regimen. He informed he may not need to submit the application if the provider changes the medication. Informed patient I would check back with him and he was agreeable.  Will followup with patient in 5-10 business days if application is not received back.  Cresta Riden P. Senie Lanese, [place] Management [phone]

## 2019-06-22 NOTE — Assessment & Plan Note (Signed)
Last A1c 2 months ago was 10%.  I reviewed his glucose log today, range of 120-220.  June was better than July.  Still mostly over 150.  I think we are yet at goal.  We attempted to add apical flows in at our last visit in May.  However this cost $290 co-pay.  This is not affordable for him.  Continues to not want to use injectables because of a fear of needles.  I am going to talk with our pharmacist to see if there are other oral SGLT2 inhibitors that will be better covered by his Medicare plan.  Continue with metformin 1000 mg twice daily.  Continue with lifestyle modification.

## 2019-06-22 NOTE — Progress Notes (Signed)
   Assessment and Plan:  See Encounters tab for problem-based medical decision making.   __________________________________________________________  HPI:   68 year old man here for follow-up of diabetes and hypertension.  He was hospitalized in May for a community-acquired pneumonia, likely a viral respiratory illness.  Two COVID-19 tests were negative at that time.  Reports doing fairly well at home.  Still feels a little congested and short of breath on occasion.  Some scratchy throat today.  Otherwise he is returned to most of his normal activities.  Checking his blood sugars about once daily.  Denies hypoglycemia.  Reports eating a better diet with mostly fruits and vegetables.  Still needs occasional ice cream.  Limited income, on Social Security.  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  Lives with his wife for whom he cares.  __________________________________________________________  Problem List: Patient Active Problem List   Diagnosis Date Noted  . Type 2 diabetes mellitus with other specified complication ([place]) 76/72/0947    Priority: High  . Obesity (BMI 30.0-34.9) 04/29/2017    Priority: Medium  . Hypertension 11/23/2011    Priority: Medium  . Dyslipidemia 11/08/2010    Priority: Medium  . Vitamin D deficiency 06/10/2016    Priority: Low  . Health care maintenance 12/31/2013    Priority: Low    Medications: Reconciled today in Epic __________________________________________________________  Physical Exam:  Vital Signs: Vitals:   06/22/19 0840  BP: 135/75  Pulse: 83  Temp: 98.2 F (36.8 C)  TempSrc: Oral  SpO2: 99%  Weight: 216 lb 3.2 oz (98.1 kg)  Height: 5\' 11"  (1.803 m)    Gen: Well appearing, NAD ENT: OP clear without erythema or exudate.  Neck: No cervical LAD, No thyromegaly or nodules CV: RRR, no murmurs, no lower extremity edema Pulm: Normal effort, CTA throughout, no wheezing Ext: Normal joints

## 2019-06-22 NOTE — Assessment & Plan Note (Signed)
Blood pressure looks good.  Hypertension well controlled.  Continue with lisinopril 40 mg daily and amlodipine 10 mg daily.

## 2019-06-24 ENCOUNTER — Other Ambulatory Visit: Payer: Self-pay | Admitting: *Deleted

## 2019-06-24 NOTE — Patient Outreach (Signed)
[facility]) Care Management  06/24/2019  Carlos Bowman. 1951/03/30 [id number]   Call placed to member to follow up on diabetes management, no answer.  HIPAA compliant voice message left.  Will follow up within the next 3-4 business days.  [name], [place], MSN [place] [phone]

## 2019-06-29 ENCOUNTER — Other Ambulatory Visit: Payer: Self-pay | Admitting: *Deleted

## 2019-06-29 NOTE — Patient Outreach (Signed)
[facility]) Care Management  06/29/2019  Carlos Bowman. 07-Feb-1951 [id number]   Call placed to member to follow up on diabetes management.  He report things are "going pretty well."  State blood sugars remain "up and down."  Today's reading was 164, but report he has had some readings in the 200s, denies having any readings less than 100.  State most have been 150's-low 200s.  He is still only taking Metformin and has requested MD to order medication that will help manage diabetes as well as be financially affordable for him.  He admits that he has "some sweets" 1-2 times/week, had a brownie last night.  Report most days when his blood sugars are elevated is when he is fasting.  State he was told by MD about the need for a well balanced diet, re-educated on same.  Next appointment with MD scheduled for 9/21, will have A1C drawn at that time.  Denies any urgent concerns, will follow up within the next month.  THN CM Care Plan Problem One     Most Recent Value  Care Plan Problem One  Knowledge deficit regarding diabetes management as evidenced by elevated A1C  Role Documenting the Problem One  Care Management [place] for Problem One  Active  THN Long Term Goal   Member's A1C will be decreased </= 8.5 within the nexft 3 months  THN Long Term Goal Start Date  05/01/19  Interventions for Problem One Long Term Goal  Discussed importance of following diabetic diet and eating well balanced meals throughout the day in effort to manage diabetes  THN CM Short Term Goal #1   Member will be able to verbalize new regime for diabetes management within the next 4 weeks  THN CM Short Term Goal #1 Start Date  06/29/19  Interventions for Short Term Goal #1  Discussed importance of follow up with MD regarding diabetes management as current management is not maintaining blood sugars at desired range  THN CM Short Term Goal #2   Member will have paperwork completed for medication  assistance within the next 2 weeks  THN CM Short Term Goal #2 Start Date  06/10/19  [facility] CM Short Term Goal #2 Met Date  -- [Not met]     [name], RN, MSN [place] Manager [phone]

## 2019-06-30 ENCOUNTER — Telehealth: Payer: Self-pay | Admitting: Pharmacist

## 2019-06-30 DIAGNOSIS — E1169 Type 2 diabetes mellitus with other specified complication: Secondary | ICD-10-CM

## 2019-07-01 ENCOUNTER — Other Ambulatory Visit: Payer: Self-pay | Admitting: Pharmacy Technician

## 2019-07-01 MED ORDER — JARDIANCE 10 MG PO TABS: 10.0000 mg | ORAL_TABLET | Freq: Every day | ORAL | 3 refills | Status: DC

## 2019-07-01 NOTE — Patient Outreach (Signed)
[facility]) Care Management  07/01/2019  Carlos Bowman. 05-07-1951 [id number]  Successful outreach call placed to patient in regards to [facility] application for Jardiance.  Spoke to patient, HIPAA identifiers verified.  Patient informed his provider was going to stop the Jardiance and place him on something that is more reasonably priced with his insurance. He informed the dr was going to do some research on this and get back to him. He informed he was not interested in applying for the patient assistance due to his regimen possibly  being changed and he is unable to obtain the financial documentation required. He informed he has placed multiple calls to the [facility] but that he has not received the information in the mail.  Confirmed patient had name and number if he needs our services in the future or if he decides to continue with the PAP process.  Will route note to [place] and remove myself from care team as patient is not interested in pursuing patient assistance at this point.  Carlos Bowman, [place] Management [phone]

## 2019-07-01 NOTE — Progress Notes (Signed)
Contacted patient to help with medication cost, low-cost options as add-on to metformin therapy. Patient states he is unable to provide paperwork for manufacturer patient assistance programs in order to access SGLT-2 inhibitors or GLP-1 agonists. Advise patients clinic can provide samples while he works on paperwork. He declined samples at this time.  Explained to patient other low-cost options include agents like glipizide or pioglitazone, but side effects may cause these to be less preferable.  Patient states he would prefer to intensify lifestyle management for now rather than adding another medication. He reports working with a Automotive engineer. Average home BG have been in the 100s, but sometimes 180-190 due to dietary indiscretion which patient states he would like to work on. I advised him to contact clinic if his home BG continue to run in the 180s-190s or above, or if any further concerns arise. Patient verbalized understanding.

## 2019-07-02 NOTE — Telephone Encounter (Signed)
Ok. Thank you for working with him.

## 2019-07-03 ENCOUNTER — Telehealth: Payer: Self-pay | Admitting: Pharmacist

## 2019-07-03 NOTE — Patient Outreach (Signed)
[place] [facility]) Care Management  07/03/2019  Carlos Bowman July 29, 1951 [id number]   Patient reported to [facility], CPhT and Flossie Dibble, PharmD that he was no longer interested in pursing patient assistance. As such, his pharmacy case will be closed.  Plan: Close patient's pharmacy case. Send message to [facility] Staff Members still active with the patient.  Elayne Guerin, PharmD, [place] Clinical Pharmacist [phone]

## 2019-07-13 ENCOUNTER — Other Ambulatory Visit: Payer: Self-pay | Admitting: Student in an Organized Health Care Education/Training Program

## 2019-07-13 DIAGNOSIS — I10 Essential (primary) hypertension: Secondary | ICD-10-CM

## 2019-07-30 ENCOUNTER — Ambulatory Visit: Payer: Medicare HMO | Admitting: Pharmacist

## 2019-07-31 ENCOUNTER — Other Ambulatory Visit: Payer: Self-pay | Admitting: *Deleted

## 2019-07-31 NOTE — Patient Outreach (Signed)
[place] [facility]) Care Management  07/31/2019  Giovan Morse Fradette. [birth year] [id number]   Call placed to member to follow up on diabetes management.  No answer, HIPAA compliant voice message left. Will follow up within the next 3-4 business days.  [name], [place], MSN [place] [phone]

## 2019-08-03 DIAGNOSIS — R69 Illness, unspecified: Secondary | ICD-10-CM | POA: Diagnosis not present

## 2019-08-06 ENCOUNTER — Other Ambulatory Visit: Payer: Self-pay | Admitting: *Deleted

## 2019-08-06 NOTE — Patient Outreach (Signed)
[place] [facility]) Care Management  08/06/2019  Carlos Bowman. Jun 09, 1951 [id number]   Call placed to member to follow up on diabetes management.  He report he has been will, blood sugars range 120-175.  7 day average reportedly 154.  He has not been fasting in the past month, has seen an improvement in blood sugars.  Admits he continues to only take Metformin and has snacks 2-3 times a week, mostly ice cream.  Confirms his appointment with PCP on 9/21, will have A1C drawn at at that time.  Denies any urgent needs, discussed transition to health coach for ongoing management, he agrees.  Will notify PCP of transition and will place referral to health coach.  THN CM Care Plan Problem One     Most Recent Value  Care Plan Problem One  Knowledge deficit regarding diabetes management as evidenced by elevated A1C  Role Documenting the Problem One  Care Management [place] for Problem One  Active  THN Long Term Goal   Member's A1C will be decreased </= 8.5 within the nexft 3 months  THN Long Term Goal Start Date  05/01/19  Interventions for Problem One Long Term Goal  Reviewed upcoming appointment with PCP, referral placed to health coach  [facility] CM Short Term Goal #1   Member will be able to verbalize new regime for diabetes management within the next 4 weeks  THN CM Short Term Goal #1 Start Date  06/29/19  [facility] CM Short Term Goal #1 Met Date  08/06/19  [facility] CM Short Term Goal #2   Member will have paperwork completed for medication assistance within the next 2 weeks  THN CM Short Term Goal #2 Start Date  06/10/19  [facility] CM Short Term Goal #2 Met Date  -- [Not met]     [name], RN, MSN [place] Manager [phone]

## 2019-08-18 ENCOUNTER — Other Ambulatory Visit: Payer: Self-pay

## 2019-08-31 ENCOUNTER — Encounter: Payer: Self-pay | Admitting: Student in an Organized Health Care Education/Training Program

## 2019-08-31 ENCOUNTER — Ambulatory Visit (INDEPENDENT_AMBULATORY_CARE_PROVIDER_SITE_OTHER): Payer: Medicare HMO | Admitting: Student in an Organized Health Care Education/Training Program

## 2019-08-31 ENCOUNTER — Other Ambulatory Visit: Payer: Self-pay

## 2019-08-31 VITALS — BP 133/72 | HR 72 | Temp 98.1°F | Wt 220.8 lb

## 2019-08-31 DIAGNOSIS — Z6830 Body mass index (BMI) 30.0-30.9, adult: Secondary | ICD-10-CM | POA: Diagnosis not present

## 2019-08-31 DIAGNOSIS — E669 Obesity, unspecified: Secondary | ICD-10-CM

## 2019-08-31 DIAGNOSIS — Z7984 Long term (current) use of oral hypoglycemic drugs: Secondary | ICD-10-CM | POA: Diagnosis not present

## 2019-08-31 DIAGNOSIS — I10 Essential (primary) hypertension: Secondary | ICD-10-CM | POA: Diagnosis not present

## 2019-08-31 DIAGNOSIS — Z79899 Other long term (current) drug therapy: Secondary | ICD-10-CM

## 2019-08-31 DIAGNOSIS — Z7982 Long term (current) use of aspirin: Secondary | ICD-10-CM | POA: Diagnosis not present

## 2019-08-31 DIAGNOSIS — Z87891 Personal history of nicotine dependence: Secondary | ICD-10-CM

## 2019-08-31 DIAGNOSIS — Z0000 Encounter for general adult medical examination without abnormal findings: Secondary | ICD-10-CM

## 2019-08-31 DIAGNOSIS — E1169 Type 2 diabetes mellitus with other specified complication: Secondary | ICD-10-CM

## 2019-08-31 LAB — GLUCOSE, CAPILLARY: Glucose-Capillary: 147 mg/dL — ABNORMAL HIGH (ref 70–99)

## 2019-08-31 LAB — POCT GLYCOSYLATED HEMOGLOBIN (HGB A1C): Hemoglobin A1C: 7.3 % — AB (ref 4.0–5.6)

## 2019-08-31 NOTE — Assessment & Plan Note (Signed)
We have struggled over this year to get control over his blood sugars.  However A1c today is 7.3%.  He is using metformin 1000 mg twice daily.  He was unable to afford the co-pays on Jardiance.  He will not accept injectable medications due to fear of needles.  Had a hypoglycemic event on glipizide around 2013.  I think as long as his A1c stays at this level, we can continue with current regimen.  He uses lisinopril for primary prevention of nephropathy.  Overdue for eye exam.  Intolerant to statins due to myalgias.  On aspirin 81 mg daily for primary prevention of ischemic vascular disease.

## 2019-08-31 NOTE — Patient Instructions (Signed)
Your A1c is 7.3%, which is excellent.  Down from 10%.  This is right at our goal.  Please keep doing what ever you are doing with your diet.  Continue to use the metformin twice daily.  We talked about increasing your exercise.  I want to see you again in 3 months so we can follow your A1c to make sure it stays at this excellent level.

## 2019-08-31 NOTE — Assessment & Plan Note (Signed)
FIT negative in December. He does not accept flu vaccines due to fear of needles.

## 2019-08-31 NOTE — Progress Notes (Signed)
   Assessment and Plan:  See Encounters tab for problem-based medical decision making.   __________________________________________________________  HPI:   67 year old man here for follow up of diabetes. Reports doing well at home, he checks FSG and says average around 150. We reviewed log and he is correct. He reports better nutrition the last few months. He is under a lot of family and financial stress. 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 His unemployment was recently cut back and he has struggled to arrange social security coverage for his wife. He reports good compliance with medications and no sideeffects. He denies chest pain or pressure. No recent illnesses or fever. He has occasional shortness of breath with exertion, quit smoking 2.5 years ago.   __________________________________________________________  Problem List: Patient Active Problem List   Diagnosis Date Noted  . Type 2 diabetes mellitus with other specified complication ([place]) 99991111    Priority: High  . Obesity (BMI 30.0-34.9) 04/29/2017    Priority: Medium  . Hypertension 11/23/2011    Priority: Medium  . Dyslipidemia 11/08/2010    Priority: Medium  . Vitamin D deficiency 06/10/2016    Priority: Low  . Health care maintenance 12/31/2013    Priority: Low    Medications: Reconciled today in Epic __________________________________________________________  Physical Exam:  Vital Signs: Vitals:   08/31/19 0823  BP: 133/72  Pulse: 72  Temp: 98.1 F (36.7 C)  TempSrc: Oral  SpO2: 97%  Weight: 220 lb 12.8 oz (100.2 kg)    Gen: Well appearing, NAD Neck: No cervical LAD, No thyromegaly or nodules, No JVD. CV: RRR, no murmurs Pulm: Normal effort, CTA throughout, no wheezing Ext: Warm, no edema, normal joints Skin: No atypical appearing moles. No rashes

## 2019-08-31 NOTE — Assessment & Plan Note (Signed)
Weight is stable at 220lbs, BMI 30. We talked about increasing exercise. He has been working with Butch Penny on nutrition improvements. Would benefit from more weightloss especially to keep his diabetes more consistently well controlled.

## 2019-08-31 NOTE — Assessment & Plan Note (Signed)
Blood pressure is well controlled today.  Plan to continue with amlodipine 10 mg daily and lisinopril 40 mg daily. Renal function in May was normal.

## 2019-09-08 ENCOUNTER — Other Ambulatory Visit: Payer: Self-pay

## 2019-09-08 NOTE — Patient Outreach (Signed)
[facility]) Care Management  09/08/2019  Carlos Bowman. 16-May-1951 [id number]    1st outreach to the patient for initial assessment.  HIPAA verified.  Identified who I was, reason for the call and [facility] services.  The patient stated that at this time he was doing fine with his diabetes.  He had a visit with his physician Dr. Evette Doffing on 08/31/19 and he has brought his a1c down from 10.3 to 7.3.  He feels that he does not need education at this time.  He did agree for me to send his a pamphlet of [facility] services for future use.  Plan:  RN Health Coach will close the case at this time.   Lazaro Arms RN, BSN, [place] Direct Dial:  [phone]  Fax: [phone]

## 2019-10-30 DIAGNOSIS — R69 Illness, unspecified: Secondary | ICD-10-CM | POA: Diagnosis not present

## 2019-11-30 ENCOUNTER — Encounter: Payer: Self-pay | Admitting: Student in an Organized Health Care Education/Training Program

## 2019-11-30 ENCOUNTER — Ambulatory Visit (INDEPENDENT_AMBULATORY_CARE_PROVIDER_SITE_OTHER): Payer: Medicare HMO | Admitting: Student in an Organized Health Care Education/Training Program

## 2019-11-30 VITALS — BP 137/80 | HR 64 | Temp 97.0°F | Ht 71.0 in | Wt 227.0 lb

## 2019-11-30 DIAGNOSIS — I10 Essential (primary) hypertension: Secondary | ICD-10-CM

## 2019-11-30 DIAGNOSIS — Z6831 Body mass index (BMI) 31.0-31.9, adult: Secondary | ICD-10-CM | POA: Diagnosis not present

## 2019-11-30 DIAGNOSIS — Z8701 Personal history of pneumonia (recurrent): Secondary | ICD-10-CM | POA: Diagnosis not present

## 2019-11-30 DIAGNOSIS — Z0000 Encounter for general adult medical examination without abnormal findings: Secondary | ICD-10-CM

## 2019-11-30 DIAGNOSIS — F408 Other phobic anxiety disorders: Secondary | ICD-10-CM | POA: Diagnosis not present

## 2019-11-30 DIAGNOSIS — Z7982 Long term (current) use of aspirin: Secondary | ICD-10-CM

## 2019-11-30 DIAGNOSIS — E1169 Type 2 diabetes mellitus with other specified complication: Secondary | ICD-10-CM

## 2019-11-30 DIAGNOSIS — Z9111 Patient's noncompliance with dietary regimen: Secondary | ICD-10-CM

## 2019-11-30 DIAGNOSIS — Z79899 Other long term (current) drug therapy: Secondary | ICD-10-CM

## 2019-11-30 DIAGNOSIS — R69 Illness, unspecified: Secondary | ICD-10-CM | POA: Diagnosis not present

## 2019-11-30 DIAGNOSIS — Z7984 Long term (current) use of oral hypoglycemic drugs: Secondary | ICD-10-CM

## 2019-11-30 DIAGNOSIS — E669 Obesity, unspecified: Secondary | ICD-10-CM

## 2019-11-30 LAB — POCT GLYCOSYLATED HEMOGLOBIN (HGB A1C): Hemoglobin A1C: 7.6 % — AB (ref 4.0–5.6)

## 2019-11-30 LAB — GLUCOSE, CAPILLARY: Glucose-Capillary: 163 mg/dL — ABNORMAL HIGH (ref 70–99)

## 2019-11-30 NOTE — Assessment & Plan Note (Signed)
Hemoglobin A1c is stable at 7.6% today.  This is slightly up from 7.3% at her last visit 3 months ago.  Likely this is owing to 10 pound weight gain over the last few months.  We will continue with Metformin 1000 mg twice daily.  We are limited on other agents as he is unable to afford the co-pays for an SGLT2 inhibitor, he had hypoglycemia with a sulfonylurea, and he has an aversion to any injectable medication.  Continue with aspirin for primary prevention of ischemic events as he is intolerant to statins due to myalgias.

## 2019-11-30 NOTE — Assessment & Plan Note (Signed)
Current weight today is 227 pounds, BMI of 31.  His weight is up 11 pounds in the last 5 months.  We talked about diet and nutrition.  He has had less activity as the weather has gotten colder and due to physical distancing guidelines.  I offered him a referral to a nutritionist but he declined, seems that he understands the goals of a low sugar diet.  We set a goal for 10 pounds of weight loss over the coming 3-6 months.

## 2019-11-30 NOTE — Progress Notes (Signed)
7

## 2019-11-30 NOTE — Assessment & Plan Note (Signed)
Blood pressure initially elevated, but on recheck it was much better at 137/62.  Plan to continue with amlodipine 10 mg daily and lisinopril 40 mg daily.  Renal function has been appropriate.  No adverse side effects to these medications.

## 2019-11-30 NOTE — Progress Notes (Signed)
   Assessment and Plan:  See Encounters tab for problem-based medical decision making.   __________________________________________________________  HPI:   68 year old man here for follow-up of diabetes and hypertension.  Patient reports doing well at home.  He reports good adherence to medications without any adverse side effects.  He has had no recent hospitalizations since May when he had an episode of community-acquired pneumonia.  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  Reports some dietary indiscretion, especially around sugary foods.  Reports less outside activity, not much exercise.  Denies chest pain, angina, or dyspnea with exertion.  __________________________________________________________  Problem List: Patient Active Problem List   Diagnosis Date Noted  . Type 2 diabetes mellitus with other specified complication ([place]) 99991111    Priority: High  . Obesity (BMI 30.0-34.9) 04/29/2017    Priority: Medium  . Hypertension 11/23/2011    Priority: Medium  . Dyslipidemia 11/08/2010    Priority: Medium  . Vitamin D deficiency 06/10/2016    Priority: Low  . Health care maintenance 12/31/2013    Priority: Low    Medications: Reconciled today in Epic __________________________________________________________  Physical Exam:  Vital Signs: Vitals:   11/30/19 0824 11/30/19 0851  BP: (!) 161/82 137/80  Pulse: 65 64  Temp: (!) 97 F (36.1 C)   TempSrc: Oral   SpO2: 97%   Weight: 227 lb (103 kg)   Height: 5\' 11"  (1.803 m)     Gen: Well appearing, NAD Neck: No cervical LAD, No thyromegaly or nodules, No JVD. CV: RRR, no murmurs Abd: Soft, NT, ND, normal BS.  Ext: Warm, no edema, normal joints

## 2019-11-30 NOTE — Assessment & Plan Note (Signed)
He reports completing a stool based colon cancer screening tests through his insurance company.  Says he will back a sample on 12/10.  I have not seen these results yet.  We talked about importance of Covid vaccination.  He is skeptical for variety of reasons, which I tried to address today.  I tried to reassure about safety and efficacy.  He is afraid of needles and I communicated my willingness to help him by prescribing a short acting benzo like Ativan to be used to help him get this vaccine.  He is going to think about it.

## 2019-11-30 NOTE — Patient Instructions (Addendum)
It was great seeing you today in the clinic.  Your blood pressure and blood sugars are doing okay.  We talked about your weight which is up 11 pounds since July.  We set a goal of losing at least 10 pounds over the next 3-6 months.  You can do this by improving your diet, eating less sugary foods and drinks, as well as increasing your exercise.  If you would like to talk with our nutritionist, Butch Penny, please let us know and I can arrange that.  We talked about the importance of the Covid vaccine.  This should be available to you around February or March.  From my perspective this seems to be a safe and very effective vaccine which I would recommend for you.  If your fear of needles keeps you from wanting to do this vaccine, let me know and I will prescribe a mild sedative to use to receive this vaccine.

## 2019-12-14 ENCOUNTER — Telehealth: Payer: Self-pay | Admitting: Student in an Organized Health Care Education/Training Program

## 2019-12-14 ENCOUNTER — Encounter: Payer: Self-pay | Admitting: Student in an Organized Health Care Education/Training Program

## 2019-12-14 NOTE — Telephone Encounter (Signed)
TC from pt and he states he needs a letter from MD in order for unemployment benefits to continue.  Letter needs to state he is high risk and "MD is not recommending he work at present time due to his health conditions because if he were to contract Covid-19 he could develop severe complications".  Pt states he received a call from the unemployment office on Saturday and was told he must get the letter in by 1/5//21. Pt would like a call when the letter is ready so he can send someone to pick it up.  SChaplin, RN,BSN

## 2019-12-14 NOTE — Telephone Encounter (Signed)
RTC, no answer, no voicemail obtained, will attempt a call later. SChaplin, RN,BSN

## 2019-12-14 NOTE — Telephone Encounter (Signed)
Ok

## 2019-12-14 NOTE — Telephone Encounter (Signed)
Pt needs a call requesting nurse [phone]

## 2019-12-14 NOTE — Telephone Encounter (Signed)
Letter placed up front for pick up, pt called and notified. SChaplin, RN,BSN

## 2020-01-26 DIAGNOSIS — R69 Illness, unspecified: Secondary | ICD-10-CM | POA: Diagnosis not present

## 2020-03-13 ENCOUNTER — Other Ambulatory Visit: Payer: Self-pay | Admitting: Student in an Organized Health Care Education/Training Program

## 2020-03-13 DIAGNOSIS — I10 Essential (primary) hypertension: Secondary | ICD-10-CM

## 2020-03-14 ENCOUNTER — Ambulatory Visit (INDEPENDENT_AMBULATORY_CARE_PROVIDER_SITE_OTHER): Payer: Medicare HMO | Admitting: Student in an Organized Health Care Education/Training Program

## 2020-03-14 ENCOUNTER — Encounter: Payer: Self-pay | Admitting: Student in an Organized Health Care Education/Training Program

## 2020-03-14 ENCOUNTER — Other Ambulatory Visit: Payer: Self-pay

## 2020-03-14 VITALS — BP 135/78 | HR 73 | Temp 98.7°F | Ht 71.0 in | Wt 228.7 lb

## 2020-03-14 DIAGNOSIS — R0600 Dyspnea, unspecified: Secondary | ICD-10-CM

## 2020-03-14 DIAGNOSIS — J849 Interstitial pulmonary disease, unspecified: Secondary | ICD-10-CM | POA: Insufficient documentation

## 2020-03-14 DIAGNOSIS — Z87891 Personal history of nicotine dependence: Secondary | ICD-10-CM

## 2020-03-14 DIAGNOSIS — Z122 Encounter for screening for malignant neoplasm of respiratory organs: Secondary | ICD-10-CM | POA: Insufficient documentation

## 2020-03-14 DIAGNOSIS — R011 Cardiac murmur, unspecified: Secondary | ICD-10-CM

## 2020-03-14 DIAGNOSIS — I10 Essential (primary) hypertension: Secondary | ICD-10-CM

## 2020-03-14 DIAGNOSIS — E1169 Type 2 diabetes mellitus with other specified complication: Secondary | ICD-10-CM | POA: Diagnosis not present

## 2020-03-14 DIAGNOSIS — Z79899 Other long term (current) drug therapy: Secondary | ICD-10-CM | POA: Diagnosis not present

## 2020-03-14 DIAGNOSIS — J302 Other seasonal allergic rhinitis: Secondary | ICD-10-CM | POA: Diagnosis not present

## 2020-03-14 DIAGNOSIS — Z7982 Long term (current) use of aspirin: Secondary | ICD-10-CM

## 2020-03-14 DIAGNOSIS — R0609 Other forms of dyspnea: Secondary | ICD-10-CM

## 2020-03-14 DIAGNOSIS — Z7984 Long term (current) use of oral hypoglycemic drugs: Secondary | ICD-10-CM | POA: Diagnosis not present

## 2020-03-14 DIAGNOSIS — Z8701 Personal history of pneumonia (recurrent): Secondary | ICD-10-CM

## 2020-03-14 LAB — POCT GLYCOSYLATED HEMOGLOBIN (HGB A1C): Hemoglobin A1C: 7.4 % — AB (ref 4.0–5.6)

## 2020-03-14 LAB — GLUCOSE, CAPILLARY: Glucose-Capillary: 156 mg/dL — ABNORMAL HIGH (ref 70–99)

## 2020-03-14 MED ORDER — LISINOPRIL 40 MG PO TABS: 40.0000 mg | ORAL_TABLET | Freq: Every day | ORAL | 3 refills | Status: AC

## 2020-03-14 MED ORDER — AMLODIPINE BESYLATE 10 MG PO TABS: 10.0000 mg | ORAL_TABLET | Freq: Every day | ORAL | 3 refills | Status: AC

## 2020-03-14 MED ORDER — METFORMIN HCL 1000 MG PO TABS: 1000.0000 mg | ORAL_TABLET | Freq: Two times a day (BID) | ORAL | 3 refills | Status: AC

## 2020-03-14 MED ORDER — CETIRIZINE HCL 10 MG PO TABS: 10.0000 mg | ORAL_TABLET | Freq: Every day | ORAL | 2 refills | Status: AC

## 2020-03-14 NOTE — Assessment & Plan Note (Signed)
Hemoglobin A1c well controlled at 7.4%.  Plan to continue with Metformin 1000 mg twice daily.  Patient has limited options for escalating diabetes care.  He had hypoglycemia to sulfonylurea, cannot afford co-pay to SGLT2 inhibitors, and is averse to injectable medications.  Continue with aspirin for primary prevention of ischemic vascular disease, patient is intolerant to statins.

## 2020-03-14 NOTE — Assessment & Plan Note (Signed)
Shared Decision Making: Past smoker (1 ppd for 40 years) with approximately a 40 pack year history. The patient meets USPTF recommendations for lung cancer screening with low dose CT in persons age 71 - 71 yrs with a 30 pack year history who are currently smoking or have quit in the past 15 yrs. We have discussed the risks and benefits of screening, including but not limited to the following. Lung cancer screening might detect about one half of lung cancer at an early, curative stage. For the average screened person, the risk of dying from lung cancer will decrease from 1.7% to 1.4% with screening. About 1 in 4 screened patients will have a false positive result possibly leading to more radiation exposure, cost, anxiety, and / or invasive procedures. 95% of false positives will not lead to a diagnosis of cancer. After discussion, Carlos Bowman. has decided to pursue lung cancer screening.

## 2020-03-14 NOTE — Patient Instructions (Signed)
Is great seeing you today in the this.  Your diabetes is doing well.  Your blood pressure is also doing well.  I refilled your medicines for a 1 year supply.  For your allergies, I prescribed a medicine called cetirizine to be used once daily as needed for runny nose and stuffed up sinuses.  For your shortness of breath, I have ordered lung function testing.  They will call you to schedule this.  This will help me to know what medicines may help you breathe better.  Because of your history of smoking, we talked about screening for lung cancer using a CT scan.  We decided to go forward with this, they will call you to schedule.

## 2020-03-14 NOTE — Assessment & Plan Note (Signed)
New complaint of occasional dyspnea on exertion.  Given very significant smoking history I am suspicious for some amount of obstructive pulmonary disease.  Exam today reassuring with no wheezing.  No signs of volume overload to suggest CHF.  Plan to check pulmonary function testing with spirometry pre and postbronchodilator, DLCO, and lung volumes.  Will recommend bronchodilator inhalers based on those results.

## 2020-03-14 NOTE — Progress Notes (Signed)
   Assessment and Plan:  See Encounters tab for problem-based medical decision making.   __________________________________________________________  HPI:   69 year old person here for follow-up of diabetes and hypertension.  He has acute complaint today of increasing shortness of breath with exertion.  This is an occasional, not every day.  Most days he feels well and can complete all activities of daily living independently.  He takes care of his wife who is of poor health.  Some days he feels like he struggles to walk around due to shortness of breath.  He has an occasional productive cough, feels like that comes from sinus drainage.  History of smoking on average 1 pack/day for at least 40 years.  He smoked 2 packs/day for about 10 years times for stressful.  Quit smoking 3 years ago.  No history of pulmonary disease except for one episode of a self-limited pneumonia about 1 year ago.  No recent fevers or chills.  No hemoptysis.  __________________________________________________________  Problem List: Patient Active Problem List   Diagnosis Date Noted  . Type 2 diabetes mellitus with other specified complication ([place]) 99991111    Priority: High  . Obesity (BMI 30.0-34.9) 04/29/2017    Priority: Medium  . Hypertension 11/23/2011    Priority: Medium  . Dyslipidemia 11/08/2010    Priority: Medium  . Vitamin D deficiency 06/10/2016    Priority: Low  . Health care maintenance 12/31/2013    Priority: Low  . Encounter for screening for lung cancer 03/14/2020  . Dyspnea on exertion 03/14/2020  . Allergic rhinitis 04/22/2013    Medications: Reconciled today in Epic __________________________________________________________  Physical Exam:  Vital Signs: Vitals:   03/14/20 0824  BP: 135/78  Pulse: 73  Temp: 98.7 F (37.1 C)  TempSrc: Oral  SpO2: 96%  Weight: 228 lb 11.2 oz (103.7 kg)  Height: 5\' 11"  (1.803 m)    Gen: Well appearing, NAD Neck: No cervical LAD, No  thyromegaly or nodules, No JVD. CV: RRR, 3 out of 6 early systolic murmur at the left upper sternal border Pulm: Normal effort, CTA throughout, no wheezing Abd: Soft, NT, ND Ext: Warm, no edema, normal joints

## 2020-03-14 NOTE — Assessment & Plan Note (Signed)
Mild allergic rhinitis related to seasonal pollen.  Plan to trial cetirizine 10 mg oral daily.  Continue with sinus irrigation on a daily basis.

## 2020-03-14 NOTE — Assessment & Plan Note (Signed)
Blood pressure fairly well controlled today.  Plan to continue with amlodipine 10 mg and lisinopril 40 mg daily.  Will check BMP at next visit.

## 2020-03-16 NOTE — Addendum Note (Signed)
Addended by: Marcelino Duster on: 03/16/2020 04:41 PM   Modules accepted: Orders

## 2020-03-17 ENCOUNTER — Telehealth: Payer: Self-pay | Admitting: *Deleted

## 2020-03-17 ENCOUNTER — Inpatient Hospital Stay (HOSPITAL_COMMUNITY): Admission: RE | Admit: 2020-03-17 | Payer: Medicare HMO | Source: Ambulatory Visit

## 2020-03-17 NOTE — Telephone Encounter (Signed)
COVID TESTING 04/13 @ [address] THRU TESTING  PFT TESTING 04/16 @ [place] 12:45 ARRIVAL IN ADMITTING FOR 1PM APPT  PT AWARE

## 2020-03-22 ENCOUNTER — Other Ambulatory Visit: Payer: Self-pay

## 2020-03-22 ENCOUNTER — Other Ambulatory Visit (HOSPITAL_COMMUNITY)
Admission: RE | Admit: 2020-03-22 | Discharge: 2020-03-22 | Disposition: A | Discharge status: Home / Self Care | Payer: Medicare HMO | Source: Ambulatory Visit | Attending: Student in an Organized Health Care Education/Training Program | Admitting: Student in an Organized Health Care Education/Training Program

## 2020-03-22 DIAGNOSIS — Z20822 Contact with and (suspected) exposure to covid-19: Secondary | ICD-10-CM | POA: Diagnosis not present

## 2020-03-22 DIAGNOSIS — Z01812 Encounter for preprocedural laboratory examination: Secondary | ICD-10-CM | POA: Insufficient documentation

## 2020-03-22 LAB — SARS CORONAVIRUS 2 (TAT 6-24 HRS): SARS Coronavirus 2: NEGATIVE

## 2020-03-25 ENCOUNTER — Other Ambulatory Visit: Payer: Self-pay

## 2020-03-25 ENCOUNTER — Ambulatory Visit (HOSPITAL_COMMUNITY)
Admission: RE | Admit: 2020-03-25 | Discharge: 2020-03-25 | Disposition: A | Discharge status: Home / Self Care | Payer: Medicare HMO | Source: Ambulatory Visit | Attending: Student in an Organized Health Care Education/Training Program | Admitting: Student in an Organized Health Care Education/Training Program

## 2020-03-25 DIAGNOSIS — R0600 Dyspnea, unspecified: Secondary | ICD-10-CM | POA: Diagnosis present

## 2020-03-25 DIAGNOSIS — R0609 Other forms of dyspnea: Secondary | ICD-10-CM

## 2020-03-25 LAB — PULMONARY FUNCTION TEST
DL/VA % pred: 92 %
DL/VA: 3.69 ml/min/mmHg/L
DLCO unc % pred: 52 %
DLCO unc: 13.98 ml/min/mmHg
FEF 25-75 Post: 0.4 L/sec
FEF 25-75 Pre: 4.33 L/sec
FEF2575-%Change-Post: -90 %
FEF2575-%Pred-Post: 15 %
FEF2575-%Pred-Pre: 170 %
FEV1-%Change-Post: -46 %
FEV1-%Pred-Post: 43 %
FEV1-%Pred-Pre: 81 %
FEV1-Post: 1.29 L
FEV1-Pre: 2.43 L
FEV1FVC-%Change-Post: -44 %
FEV1FVC-%Pred-Pre: 112 %
FEV6-%Change-Post: -11 %
FEV6-%Pred-Post: 64 %
FEV6-%Pred-Pre: 72 %
FEV6-Post: 2.44 L
FEV6-Pre: 2.75 L
FEV6FVC-%Change-Post: -10 %
FEV6FVC-%Pred-Post: 93 %
FEV6FVC-%Pred-Pre: 104 %
FVC-%Change-Post: -3 %
FVC-%Pred-Post: 68 %
FVC-%Pred-Pre: 71 %
FVC-Post: 2.73 L
FVC-Pre: 2.83 L
Post FEV1/FVC ratio: 47 %
Post FEV6/FVC ratio: 90 %
Pre FEV1/FVC ratio: 86 %
Pre FEV6/FVC Ratio: 100 %
RV % pred: 66 %
RV: 1.67 L
TLC % pred: 62 %
TLC: 4.52 L

## 2020-03-25 MED ORDER — ALBUTEROL SULFATE (2.5 MG/3ML) 0.083% IN NEBU
2.5000 mg | INHALATION_SOLUTION | Freq: Once | RESPIRATORY_TRACT | Status: AC
  Administered 2020-03-25: 13:00:00 2.5 mg via RESPIRATORY_TRACT

## 2020-04-04 ENCOUNTER — Telehealth: Payer: Self-pay | Admitting: Student in an Organized Health Care Education/Training Program

## 2020-04-04 NOTE — Telephone Encounter (Signed)
I tried to call Carlos Bowman regarding results of pulmonary function testing. No answer and no VM. No signs of COPD but there is a restrictive process and diffusion defect. I previously ordered a CT chest for lung cancer screening, but don't see that it has been scheduled yet. Can we follow up with him to ask that he schedules that CT please?

## 2020-04-04 NOTE — Telephone Encounter (Signed)
Chilon, can you f/u on this CT? Thanks

## 2020-04-06 NOTE — Telephone Encounter (Signed)
Submitted to [facility] for Authorization  Service Order: [id number]  Case Status: Pending Case Activity: Submission in Progress

## 2020-04-08 NOTE — Telephone Encounter (Signed)
Fri 04/22/20 10:30 AM [facility]-CT IMAGING [facility]-CT 1 CT CHEST LUNG CANCER SCREENING Scheduled

## 2020-04-22 ENCOUNTER — Ambulatory Visit (HOSPITAL_COMMUNITY)
Admission: RE | Admit: 2020-04-22 | Discharge: 2020-04-22 | Disposition: A | Discharge status: Home / Self Care | Payer: Medicare HMO | Source: Ambulatory Visit | Attending: Student in an Organized Health Care Education/Training Program | Admitting: Student in an Organized Health Care Education/Training Program

## 2020-04-22 ENCOUNTER — Other Ambulatory Visit: Payer: Self-pay

## 2020-04-22 DIAGNOSIS — Z87891 Personal history of nicotine dependence: Secondary | ICD-10-CM | POA: Insufficient documentation

## 2020-04-22 DIAGNOSIS — J439 Emphysema, unspecified: Secondary | ICD-10-CM | POA: Diagnosis not present

## 2020-04-22 DIAGNOSIS — I700 Atherosclerosis of aorta: Secondary | ICD-10-CM | POA: Insufficient documentation

## 2020-04-22 DIAGNOSIS — Z122 Encounter for screening for malignant neoplasm of respiratory organs: Secondary | ICD-10-CM

## 2020-04-25 ENCOUNTER — Telehealth: Payer: Self-pay | Admitting: Student in an Organized Health Care Education/Training Program

## 2020-04-25 DIAGNOSIS — J849 Interstitial pulmonary disease, unspecified: Secondary | ICD-10-CM

## 2020-04-25 DIAGNOSIS — E1169 Type 2 diabetes mellitus with other specified complication: Secondary | ICD-10-CM

## 2020-04-25 NOTE — Telephone Encounter (Signed)
Left voice message for the patient to return my call. I evaluated him in April and he had a complaint of dyspnea on exertion. He has a high tobacco use history. PFTs completed and do not show obstructive disease as I expected, rather show restrictive disease and a reduced DLCO. 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 He has no history of ILD, no known rheumatologic disease, and no environmental exposures that I am aware of. I will refer him to pulmonology to consider further testing for possible IPF and consider treatments.

## 2020-04-27 NOTE — Telephone Encounter (Signed)
Tried again to call today but no answer. I asked Mamie to try to call him again to communicate CT results:  Please tell him the CT of his chest does not show a tumor, but it does show inflammation in the lungs which could indicate a different kind of lung disease. Given how much shortness of breath he has, I have referred him to Pulmonology for more testing and possibly treatment.   Thank you.

## 2020-04-28 NOTE — Addendum Note (Signed)
Addended by: Lalla Brothers T on: 04/28/2020 03:17 PM   Modules accepted: Orders

## 2020-04-28 NOTE — Telephone Encounter (Signed)
Spoke with patient today. Answered questions about the CT findings and the need for pulmonology evaluation. He is agreeable.   Patient requested eye doctor referral to screen for diabetic retinopathy. His insurance company sent a list of preferred clinics, one includes [facility]. I will place referral and hopefully they can work to refer within this preferred list.

## 2020-05-24 DIAGNOSIS — E119 Type 2 diabetes mellitus without complications: Secondary | ICD-10-CM | POA: Diagnosis not present

## 2020-06-01 DIAGNOSIS — I10 Essential (primary) hypertension: Secondary | ICD-10-CM | POA: Diagnosis not present

## 2020-06-01 DIAGNOSIS — Z8249 Family history of ischemic heart disease and other diseases of the circulatory system: Secondary | ICD-10-CM | POA: Diagnosis not present

## 2020-06-01 DIAGNOSIS — J309 Allergic rhinitis, unspecified: Secondary | ICD-10-CM | POA: Diagnosis not present

## 2020-06-01 DIAGNOSIS — Z803 Family history of malignant neoplasm of breast: Secondary | ICD-10-CM | POA: Diagnosis not present

## 2020-06-01 DIAGNOSIS — R69 Illness, unspecified: Secondary | ICD-10-CM | POA: Diagnosis not present

## 2020-06-01 DIAGNOSIS — Z6834 Body mass index (BMI) 34.0-34.9, adult: Secondary | ICD-10-CM | POA: Diagnosis not present

## 2020-06-01 DIAGNOSIS — E119 Type 2 diabetes mellitus without complications: Secondary | ICD-10-CM | POA: Diagnosis not present

## 2020-06-01 DIAGNOSIS — Z008 Encounter for other general examination: Secondary | ICD-10-CM | POA: Diagnosis not present

## 2020-06-01 DIAGNOSIS — Z7982 Long term (current) use of aspirin: Secondary | ICD-10-CM | POA: Diagnosis not present

## 2020-06-01 DIAGNOSIS — Z7984 Long term (current) use of oral hypoglycemic drugs: Secondary | ICD-10-CM | POA: Diagnosis not present

## 2020-06-01 DIAGNOSIS — E669 Obesity, unspecified: Secondary | ICD-10-CM | POA: Diagnosis not present

## 2020-06-20 ENCOUNTER — Encounter: Payer: Self-pay | Admitting: Student in an Organized Health Care Education/Training Program

## 2020-06-20 ENCOUNTER — Ambulatory Visit: Payer: Medicare HMO | Admitting: Student in an Organized Health Care Education/Training Program

## 2020-06-20 ENCOUNTER — Other Ambulatory Visit: Payer: Self-pay

## 2020-06-20 VITALS — BP 136/75 | HR 73 | Temp 98.1°F | Ht 71.0 in | Wt 231.9 lb

## 2020-06-20 DIAGNOSIS — E1169 Type 2 diabetes mellitus with other specified complication: Secondary | ICD-10-CM

## 2020-06-20 DIAGNOSIS — G720 Drug-induced myopathy: Secondary | ICD-10-CM | POA: Insufficient documentation

## 2020-06-20 DIAGNOSIS — J849 Interstitial pulmonary disease, unspecified: Secondary | ICD-10-CM

## 2020-06-20 DIAGNOSIS — I10 Essential (primary) hypertension: Secondary | ICD-10-CM

## 2020-06-20 LAB — POCT GLYCOSYLATED HEMOGLOBIN (HGB A1C): Hemoglobin A1C: 8.3 % — AB (ref 4.0–5.6)

## 2020-06-20 LAB — GLUCOSE, CAPILLARY: Glucose-Capillary: 187 mg/dL — ABNORMAL HIGH (ref 70–99)

## 2020-06-20 MED ORDER — SITAGLIPTIN PHOSPHATE 50 MG PO TABS: 50.0000 mg | ORAL_TABLET | Freq: Every day | ORAL | 3 refills | Status: AC

## 2020-06-20 NOTE — Assessment & Plan Note (Signed)
Hemoglobin A1c elevated today at 8.3%, up above 7.4% at last visit.  This is due to dietary indiscretion.  Plan to continue with Metformin 1000 mg twice daily, I am going to add sitagliptin 50 mg daily.  I would have preferred an SGLT2 inhibitor however historically he could not afford the co-pays.  He is also averse to needles and would not accept injectable medications.  Plan to continue with aspirin 81 mg daily for primary prevention of ischemic event.

## 2020-06-20 NOTE — Assessment & Plan Note (Addendum)
Blood pressure is at goal today.  Plan to continue with lisinopril 40, and amlodipine 10 mg daily.  Check BMP today.

## 2020-06-20 NOTE — Assessment & Plan Note (Signed)
Patient with significant tobacco use history reported significant dyspnea on exertion at her last visit in April.  Pulmonary function testing obtained and not consistent with COPD, FEV1 to FVC ratio 86, excellent FEV1, no medium airway disease, no response to bronchodilator, no air trapping on lung volumes, but there is a significantly reduced DLCO to 52% predicted.  We also completed a CT chest for lung cancer screening which incidentally found subpleural reticulations/fibrosis bilaterally with traction bronchiectasis suggesting moderate chronic interstitial lung disease.  We have referred the patient to pulmonology and he has initial consultation with them tomorrow.  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  Not on any inhalers right now, and symptomatically happens to feel better now with no intervention.  I talked to him about the importance of following up with pulmonology as given the changes I am seeing on PFTs and imaging I expect that this will be a recurring problem.

## 2020-06-20 NOTE — Patient Instructions (Signed)
Please go to the lung doctor appointment tomorrow. I think we need to get a better sense of why you have so much lung damage.  I am starting a new medicine called sitagliptin for your diabetes.  Take this tablet once a day.

## 2020-06-20 NOTE — Progress Notes (Signed)
° °  Assessment and Plan:  See Encounters tab for problem-based medical decision making.   __________________________________________________________  HPI:   69 year old person here for follow-up of diabetes and hypertension.  Last seen in April and complained of significant dyspnea with minimal exertion.  Feeling much better now without any interventions from me.  Says he can do most chores around the house without difficulty now.  Does not exercise.  Endorses recent dietary indiscretion, says he has been eating too much carbs, he knew that his blood sugar was going to be elevated today.  Otherwise reports good adherence to his medications.  Denies any recent illnesses, no ED visits, no recent hospitalizations.  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  We reviewed his glucose logs which show mostly elevated readings in the morning up until noon, around 140-200.  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    Priority: Low   Encounter for screening for lung cancer 03/14/2020    Priority: Low   Vitamin D deficiency 06/10/2016    Priority: [place] care maintenance 12/31/2013    Priority: Low   Allergic rhinitis 04/22/2013    Priority: Low    Medications: Reconciled today in Epic __________________________________________________________  Physical Exam:  Vital Signs: Vitals:   06/20/20 0854  BP: 136/75  Pulse: 73  Temp: 98.1 F (36.7 C)  SpO2: 96%  Weight: 231 lb 14.4 oz (105.2 kg)   Height: 5\' 11"  (1.803 m)    Gen: Well appearing, NAD Neck: No cervical LAD, No thyromegaly or nodules CV: RRR, 3 out of 6 early systolic murmur at the right upper sternal border, present since childhood Pulm: Normal effort, CTA throughout, no wheezing Ext: Warm, no edema, normal joints

## 2020-06-20 NOTE — Assessment & Plan Note (Signed)
History of significant myopathy on pravastatin in 2018.  This was primary prevention of ischemic vascular events, he was at very increased risk based on ASCVD calculator.  We have supplemented vitamin D and is even tried co-Q10 but unfortunately is not willing to retrial statin therapy at this time.

## 2020-06-21 ENCOUNTER — Ambulatory Visit: Payer: Medicare HMO | Admitting: Pulmonary Disease

## 2020-06-21 ENCOUNTER — Encounter: Payer: Self-pay | Admitting: Pulmonary Disease

## 2020-06-21 ENCOUNTER — Encounter: Payer: Self-pay | Admitting: Student in an Organized Health Care Education/Training Program

## 2020-06-21 VITALS — BP 138/70 | HR 102 | Temp 98.1°F | Ht 71.0 in | Wt 233.0 lb

## 2020-06-21 DIAGNOSIS — J849 Interstitial pulmonary disease, unspecified: Secondary | ICD-10-CM | POA: Diagnosis not present

## 2020-06-21 LAB — BMP8+ANION GAP
Anion Gap: 15 mmol/L (ref 10.0–18.0)
BUN/Creatinine Ratio: 11 (ref 10–24)
BUN: 11 mg/dL (ref 8–27)
CO2: 22 mmol/L (ref 20–29)
Calcium: 9.9 mg/dL (ref 8.6–10.2)
Chloride: 103 mmol/L (ref 96–106)
Creatinine, Ser: 0.99 mg/dL (ref 0.76–1.27)
GFR calc Af Amer: 88 mL/min/{1.73_m2} (ref >59)
GFR calc non Af Amer: 76 mL/min/{1.73_m2} (ref >59)
Glucose: 172 mg/dL — ABNORMAL HIGH (ref 65–99)
Potassium: 4.5 mmol/L (ref 3.5–5.2)
Sodium: 140 mmol/L (ref 134–144)

## 2020-06-21 NOTE — Progress Notes (Signed)
Carlos Bowman    [id number]    1951-07-28  Primary Care Physician:Vincent, Mallie Mussel, MD  Referring Physician: Axel Filler, MD [address] [place],  [place] [address]  Chief complaint: Consult for interstitial lung disease  HPI: 69 year old ex-smoker with hypertension, diabetes mellitus  Complains of increasing dyspnea for the past 6 months.  Symptoms are intermittent in nature.  Has occasional chest congestion.  No significant cough.  Pets: No pets Occupation: Retired Administrator Exposures: No known exposures.  No mold, hot tub, Jacuzzi.  No down pillows or comforters Smoking history: 50-pack-year smoker.  Quit in 2016 Travel history: No significant travel history Relevant family history: Siblings had emphysema.  They were smokers.  Outpatient Encounter Medications as of 06/21/2020  Medication Sig  . amLODipine (NORVASC) 10 MG tablet Take 1 tablet (10 mg total) by mouth daily.  Marland Kitchen aspirin EC 81 MG tablet Take 1 tablet (81 mg total) by mouth daily.  . Blood Glucose Monitoring Suppl (ONETOUCH VERIO) w/Device KIT Check blood sugar 1 time a day  . cetirizine (ZYRTEC ALLERGY) 10 MG tablet Take 1 tablet (10 mg total) by mouth daily.  . Cholecalciferol (VITAMIN D3) 10 MCG (400 UNIT) CAPS Take 400 Units by mouth daily.  Marland Kitchen glucose blood (ONETOUCH VERIO) test strip Check blood sugar 1 time a day  . lisinopril (ZESTRIL) 40 MG tablet Take 1 tablet (40 mg total) by mouth daily.  . metFORMIN (GLUCOPHAGE) 1000 MG tablet Take 1 tablet (1,000 mg total) by mouth 2 (two) times daily with a meal.  . OneTouch Delica Lancets 79G MISC Use to check blood sugar 1 time a day  . sitaGLIPtin (JANUVIA) 50 MG tablet Take 1 tablet (50 mg total) by mouth daily.   No facility-administered encounter medications on file as of 06/21/2020.    Allergies as of 06/21/2020  . (No Known Allergies)    Past Medical History:  Diagnosis Date  . Diabetes mellitus   .  Hyperlipidemia   . Hypertension   . Subclinical hyperthyroidism   . Tobacco use     Past Surgical History:  Procedure Laterality Date  . DENTAL SURGERY    . 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    Family History  Problem Relation Age of Onset  . Colon cancer Father        Died at age 51 due to it.  Marland Kitchen Heart disease Mother   . Gout Mother   . Diabetes Sister   . Breast cancer Sister   . Lung cancer Paternal Uncle   . Cancer Paternal Aunt        Unknown cancer    Social History   Socioeconomic History  . Marital status: Married    Spouse name: Not on file  . Number of children: Not on file  . Years of education: Not on file  . Highest education level: Not on file  Occupational History  . Not on file  Tobacco Use  . Smoking status: Former Smoker    Packs/day: 0.50    Types: Cigarettes    Quit date: 05/06/2017    Years since quitting: 3.1  . Smokeless tobacco: Never Used  . Tobacco comment: cutting back still smoking 4.5 packs per week  Vaping Use  . Vaping Use: Never used  Substance and Sexual Activity  . Alcohol use: No  . Drug use: No  . Sexual  activity: Not on file  Other Topics Concern  . Not on file  Social History Narrative   Lives in [place] with his wife.   Retired from job earlier  in 2012.   Has no kids.    Has 4 stepchildren      Does not have any insurance at present - 11/23/2011.    Social Determinants of Health   Financial Resource Strain:   . Difficulty of Paying Living Expenses:   Food Insecurity:   . Worried About Charity fundraiser in the Last Year:   . Arboriculturist in the Last Year:   Transportation Needs:   . Film/video editor (Medical):   Marland Kitchen Lack of Transportation (Non-Medical):   Physical Activity:   . Days of Exercise per Week:   . Minutes of Exercise per Session:   Stress:   . Feeling of Stress :   Social Connections:   . Frequency of Communication with Friends and Family:   .  Frequency of Social Gatherings with Friends and Family:   . Attends Religious Services:   . Active Member of Clubs or Organizations:   . Attends Archivist Meetings:   Marland Kitchen Marital Status:   Intimate Partner Violence:   . Fear of Current or Ex-Partner:   . Emotionally Abused:   Marland Kitchen Physically Abused:   . Sexually Abused:     Review of systems: Review of Systems  Constitutional: Negative for fever and chills.  HENT: Negative.   Eyes: Negative for blurred vision.  Respiratory: as per HPI  Cardiovascular: Negative for chest pain and palpitations.  Gastrointestinal: Negative for vomiting, diarrhea, blood per rectum. Genitourinary: Negative for dysuria, urgency, frequency and hematuria.  Musculoskeletal: Negative for myalgias, back pain and joint pain.  Skin: Negative for itching and rash.  Neurological: Negative for dizziness, tremors, focal weakness, seizures and loss of consciousness.  Endo/Heme/Allergies: Negative for environmental allergies.  Psychiatric/Behavioral: Negative for depression, suicidal ideas and hallucinations.  All other systems reviewed and are negative.  Physical Exam: Blood pressure 138/70, pulse (!) 102, temperature 98.1 F (36.7 C), temperature source Oral, height '5\' 11"'  (1.803 m), weight 233 lb (105.7 kg), SpO2 97 %. Gen:      No acute distress HEENT:  EOMI, sclera anicteric Neck:     No masses; no thyromegaly Lungs:    Clear to auscultation bilaterally; normal respiratory effort CV:         Regular rate and rhythm; no murmurs Abd:      + bowel sounds; soft, non-tender; no palpable masses, no distension Ext:    + clubbing Skin:      Warm and dry; no rash Neuro: alert and oriented x 3 Psych: normal mood and affect  Data Reviewed: Imaging: Screening CT chest 04/22/2020-mild emphysema, subpleural reticulation, traction bronchiectasis with basal gradient fibrosis.  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  May even have honeycombing at the bases but unclear on low resolution CT of the chest.  This is likely IPF in a smoker presenting with dyspnea, clubbing and no exposures or CTD signs and symptoms Order high-resolution CT for better evaluation of pulmonary fibrosis We will get some baseline labs for completion sake, check ILD questionnaire  Follow-up in 1 month for review and discussion of treatment options.  Plan/Recommendations: CTD serologies, high-res CT ILD questionnaire  Follow-up in 1 month  Marshell Garfinkel MD [facility] 06/21/2020, 11:46 AM  CC: Axel Filler

## 2020-06-21 NOTE — Patient Instructions (Signed)
We will get lab work today to evaluate interstitial lung disease Schedule high-resolution CT We will give you a questionnaire to check for exposures but could be a cause for the scarring in your lung  Return to clinic in 1 month for reevaluation and discuss next steps for treatment.

## 2020-07-12 ENCOUNTER — Other Ambulatory Visit: Payer: Self-pay | Admitting: Student in an Organized Health Care Education/Training Program

## 2020-07-12 DIAGNOSIS — E1169 Type 2 diabetes mellitus with other specified complication: Secondary | ICD-10-CM

## 2020-07-12 DIAGNOSIS — R69 Illness, unspecified: Secondary | ICD-10-CM | POA: Diagnosis not present

## 2020-07-22 ENCOUNTER — Ambulatory Visit
Admission: RE | Admit: 2020-07-22 | Discharge: 2020-07-22 | Disposition: A | Discharge status: Home / Self Care | Payer: Medicare HMO | Source: Ambulatory Visit | Attending: Pulmonary Disease | Admitting: Pulmonary Disease

## 2020-07-22 DIAGNOSIS — J8489 Other specified interstitial pulmonary diseases: Secondary | ICD-10-CM | POA: Diagnosis not present

## 2020-07-22 DIAGNOSIS — I2510 Atherosclerotic heart disease of native coronary artery without angina pectoris: Secondary | ICD-10-CM | POA: Diagnosis not present

## 2020-07-22 DIAGNOSIS — I700 Atherosclerosis of aorta: Secondary | ICD-10-CM | POA: Diagnosis not present

## 2020-07-22 DIAGNOSIS — J84112 Idiopathic pulmonary fibrosis: Secondary | ICD-10-CM | POA: Diagnosis not present

## 2020-07-22 DIAGNOSIS — J849 Interstitial pulmonary disease, unspecified: Secondary | ICD-10-CM

## 2020-07-26 DIAGNOSIS — H40021 Open angle with borderline findings, high risk, right eye: Secondary | ICD-10-CM | POA: Diagnosis not present

## 2020-07-26 LAB — HM DIABETES EYE EXAM

## 2020-08-08 ENCOUNTER — Encounter: Payer: Self-pay | Admitting: *Deleted

## 2020-08-10 ENCOUNTER — Other Ambulatory Visit: Payer: Self-pay

## 2020-08-10 ENCOUNTER — Encounter: Payer: Self-pay | Admitting: Pulmonary Disease

## 2020-08-10 ENCOUNTER — Ambulatory Visit: Payer: Medicare HMO | Admitting: Pulmonary Disease

## 2020-08-10 VITALS — BP 124/80 | HR 76 | Temp 98.1°F | Ht 71.0 in | Wt 228.4 lb

## 2020-08-10 DIAGNOSIS — J849 Interstitial pulmonary disease, unspecified: Secondary | ICD-10-CM | POA: Diagnosis not present

## 2020-08-10 NOTE — Progress Notes (Signed)
Carlos Bowman    [id number]    01/02/1951  Primary Care Physician:Vincent, Mallie Mussel, MD  Referring Physician: Axel Filler, MD [address] [place],  [place] [address]  Chief complaint: Follow-up for interstitial lung disease  HPI: 69 year old ex-smoker with hypertension, diabetes mellitus  Complains of increasing dyspnea for the past 6 months.  Symptoms are intermittent in nature.  Has occasional chest congestion.  No significant cough.  Pets: No pets Occupation: Retired Administrator Exposures: No known exposures.  No mold, hot tub, Jacuzzi.  No down pillows or comforters Smoking history: 50-pack-year smoker.  Quit in 2016 Travel history: No significant travel history Relevant family history: Siblings had emphysema.  They were smokers.  Interim history: Here for review of CT scan He was supposed to have ILD serologies drawn at last visit for some reason has not been done yet. States that he has dyspnea on exertion at baseline.  Outpatient Encounter Medications as of 08/10/2020  Medication Sig  . amLODipine (NORVASC) 10 MG tablet Take 1 tablet (10 mg total) by mouth daily.  Marland Kitchen aspirin EC 81 MG tablet Take 1 tablet (81 mg total) by mouth daily.  . Blood Glucose Monitoring Suppl (ONETOUCH VERIO) w/Device KIT Check blood sugar 1 time a day  . cetirizine (ZYRTEC ALLERGY) 10 MG tablet Take 1 tablet (10 mg total) by mouth daily.  . Cholecalciferol (VITAMIN D3) 10 MCG (400 UNIT) CAPS Take 400 Units by mouth daily.  Marland Kitchen lisinopril (ZESTRIL) 40 MG tablet Take 1 tablet (40 mg total) by mouth daily.  . metFORMIN (GLUCOPHAGE) 1000 MG tablet Take 1 tablet (1,000 mg total) by mouth 2 (two) times daily with a meal.  . OneTouch Delica Lancets 53I MISC Use to check blood sugar 1 time a day  . ONETOUCH VERIO test strip USE  STRIP TO CHECK GLUCOSE ONCE DAILY  . sitaGLIPtin (JANUVIA) 50 MG tablet Take 1 tablet (50 mg total) by mouth daily.   No  facility-administered encounter medications on file as of 08/10/2020.    Physical Exam: Blood pressure 124/80, pulse 76, temperature 98.1 F (36.7 C), temperature source Temporal, height _0  (1.803 m), weight 228 lb 6.4 oz (103.6 kg), SpO2 97 %. Gen:      No acute distress HEENT:  EOMI, sclera anicteric Neck:     No masses; no thyromegaly Lungs:    Clear to auscultation bilaterally; normal respiratory effort CV:         Regular rate and rhythm; no murmurs Abd:      + bowel sounds; soft, non-tender; no palpable masses, no distension Ext:    No edema; adequate peripheral perfusion Skin:      Warm and dry; no rash Neuro: alert and oriented x 3 Psych: normal mood and affect  Data Reviewed: Imaging: Screening CT chest 04/22/2020-mild emphysema, subpleural reticulation, traction bronchiectasis with basal gradient fibrosis.   High-resolution CT 07/22/2020-emphysema, basilar predominant pulmonary fibrosis with traction bronchiectasis, enlarged pulmonary artery.  I have reviewed the images personally.  PFTs: 03/25/2020 FVC 2.83 [71%], FEV1 2.43 [81%], F/F 86, TLC 4.52 [62%], DLCO 13.98 [52%] Moderate-severe restriction, diffusion defect  Assessment:  Interstitial lung disease  This is likely IPF in a smoker presenting with dyspnea, clubbing and no exposures or CTD signs and symptoms High-res CT reviewed.  Although the the read is alternate it does appear to be probable UIP pattern to me He states that he has rheumatoid arthritis but is  unable to give me further details We will get labs today for evaluation of connective tissue disease Also get echocardiogram for evaluation of pulmonary hypertension given enlarged pulmonary arteries.  Follow-up in 1 month for review and discussion of treatment options.  Plan/Recommendations: CTD serologies  Follow-up in 1 month  Marshell Garfinkel MD [facility] 08/10/2020, 12:05 PM  CC: Axel Filler

## 2020-08-10 NOTE — Addendum Note (Signed)
Addended by: Suzzanne Cloud E on: 08/10/2020 12:30 PM   Modules accepted: Orders

## 2020-08-10 NOTE — Addendum Note (Signed)
Addended by: Gavin Potters R on: 08/10/2020 12:32 PM   Modules accepted: Orders

## 2020-08-10 NOTE — Patient Instructions (Signed)
Your CT scan shows scarring in the lung  We will get some labs today for evaluation of interstitial lung disease and to find out the reason that you are developing the scarring I will follow back with you in 1 month for review and discuss options for treatment.

## 2020-08-11 ENCOUNTER — Telehealth: Payer: Self-pay | Admitting: Pulmonary Disease

## 2020-08-11 NOTE — Telephone Encounter (Signed)
Called and spoke with Patient.  Patient stated he received call with appointment time for a test ordered by Dr Vaughan Browner. Patient is scheduled 08/18/20, at 1400, for echo. Patient stated understanding.  Nothing further at this time.

## 2020-08-12 LAB — SJOGREN'S SYNDROME ANTIBODS(SSA + SSB)
SSA (Ro) (ENA) Antibody, IgG: <1 AI
SSB (La) (ENA) Antibody, IgG: <1 AI

## 2020-08-12 LAB — ANA,IFA RA DIAG PNL W/RFLX TIT/PATN
Anti Nuclear Antibody (ANA): POSITIVE — AB
Cyclic Citrullin Peptide Ab: <16 UNITS
Rheumatoid fact SerPl-aCnc: <14 IU/mL (ref <14)

## 2020-08-12 LAB — ANTI-NUCLEAR AB-TITER (ANA TITER): ANA Titer 1: 1:80 {titer} — ABNORMAL HIGH

## 2020-08-12 LAB — ANCA SCREEN W REFLEX TITER: ANCA Screen: NEGATIVE

## 2020-08-18 ENCOUNTER — Ambulatory Visit (HOSPITAL_COMMUNITY)
Admission: RE | Admit: 2020-08-18 | Discharge: 2020-08-18 | Disposition: A | Discharge status: Home / Self Care | Payer: Medicare HMO | Source: Ambulatory Visit | Attending: Pulmonary Disease | Admitting: Pulmonary Disease

## 2020-08-18 ENCOUNTER — Other Ambulatory Visit: Payer: Self-pay

## 2020-08-18 DIAGNOSIS — J849 Interstitial pulmonary disease, unspecified: Secondary | ICD-10-CM | POA: Diagnosis not present

## 2020-08-18 DIAGNOSIS — I10 Essential (primary) hypertension: Secondary | ICD-10-CM | POA: Insufficient documentation

## 2020-08-18 DIAGNOSIS — E039 Hypothyroidism, unspecified: Secondary | ICD-10-CM | POA: Diagnosis not present

## 2020-08-18 DIAGNOSIS — E785 Hyperlipidemia, unspecified: Secondary | ICD-10-CM | POA: Diagnosis not present

## 2020-08-18 DIAGNOSIS — Z87891 Personal history of nicotine dependence: Secondary | ICD-10-CM | POA: Insufficient documentation

## 2020-08-18 DIAGNOSIS — I272 Pulmonary hypertension, unspecified: Secondary | ICD-10-CM

## 2020-08-18 DIAGNOSIS — E119 Type 2 diabetes mellitus without complications: Secondary | ICD-10-CM | POA: Diagnosis not present

## 2020-08-18 LAB — ECHOCARDIOGRAM COMPLETE
AR max vel: 1.53 cm2
AV Area VTI: 1.5 cm2
AV Area mean vel: 1.32 cm2
AV Mean grad: 10.5 mmHg
AV Peak grad: 19.7 mmHg
Ao pk vel: 2.22 m/s
Area-P 1/2: 3.37 cm2
S' Lateral: 2.4 cm

## 2020-08-18 NOTE — Progress Notes (Signed)
  Echocardiogram 2D Echocardiogram has been performed.  Carlos Bowman G [name] Janvier 08/18/2020, 2:52 PM

## 2020-08-23 LAB — HYPERSENSITIVITY PNEUMONITIS
A. Pullulans Abs: NEGATIVE
A.Fumigatus #1 Abs: NEGATIVE
Micropolyspora faeni, IgG: NEGATIVE
Pigeon Serum Abs: NEGATIVE
Thermoact. Saccharii: NEGATIVE
Thermoactinomyces vulgaris, IgG: NEGATIVE

## 2020-08-25 ENCOUNTER — Encounter: Payer: Self-pay | Admitting: *Deleted

## 2020-08-25 ENCOUNTER — Telehealth: Payer: Self-pay | Admitting: *Deleted

## 2020-08-25 NOTE — Telephone Encounter (Signed)
Echo shows pumping action of the heart is good. There is mild stiffening of the heart walls and calcium build up in the valve that we can see with age. Otherwise there are no significant abnormalities.  Also let him know that the labs show mild non significant changes.  Make follow up in clinic to discuss results in detail and plan for next steps  Spoke with pt and notified of results per Dr. Vaughan Browner. Pt verbalized understanding and denied any questions.

## 2020-08-26 LAB — MYOMARKER 3 PLUS PROFILE (RDL)

## 2020-09-16 ENCOUNTER — Encounter: Payer: Self-pay | Admitting: *Deleted

## 2020-09-16 NOTE — Progress Notes (Signed)

## 2020-09-20 ENCOUNTER — Telehealth: Payer: Self-pay | Admitting: *Deleted

## 2020-09-20 ENCOUNTER — Emergency Department (HOSPITAL_COMMUNITY): Payer: Medicare HMO

## 2020-09-20 ENCOUNTER — Encounter (HOSPITAL_COMMUNITY): Payer: Self-pay | Admitting: Internal Medicine

## 2020-09-20 ENCOUNTER — Inpatient Hospital Stay (HOSPITAL_COMMUNITY)
Admission: EM | Admit: 2020-09-20 | Discharge: 2020-10-10 | DRG: 208 | Disposition: E | Payer: Medicare HMO | Attending: Pulmonary Disease | Admitting: Pulmonary Disease

## 2020-09-20 ENCOUNTER — Other Ambulatory Visit: Payer: Self-pay

## 2020-09-20 DIAGNOSIS — I959 Hypotension, unspecified: Secondary | ICD-10-CM | POA: Diagnosis not present

## 2020-09-20 DIAGNOSIS — L8990 Pressure ulcer of unspecified site, unspecified stage: Secondary | ICD-10-CM | POA: Insufficient documentation

## 2020-09-20 DIAGNOSIS — Z6829 Body mass index (BMI) 29.0-29.9, adult: Secondary | ICD-10-CM | POA: Diagnosis not present

## 2020-09-20 DIAGNOSIS — L89812 Pressure ulcer of head, stage 2: Secondary | ICD-10-CM | POA: Diagnosis present

## 2020-09-20 DIAGNOSIS — Z87891 Personal history of nicotine dependence: Secondary | ICD-10-CM | POA: Diagnosis not present

## 2020-09-20 DIAGNOSIS — J80 Acute respiratory distress syndrome: Secondary | ICD-10-CM | POA: Diagnosis not present

## 2020-09-20 DIAGNOSIS — E0590 Thyrotoxicosis, unspecified without thyrotoxic crisis or storm: Secondary | ICD-10-CM | POA: Diagnosis present

## 2020-09-20 DIAGNOSIS — E872 Acidosis: Secondary | ICD-10-CM | POA: Diagnosis not present

## 2020-09-20 DIAGNOSIS — Z833 Family history of diabetes mellitus: Secondary | ICD-10-CM | POA: Diagnosis not present

## 2020-09-20 DIAGNOSIS — T380X5A Adverse effect of glucocorticoids and synthetic analogues, initial encounter: Secondary | ICD-10-CM | POA: Diagnosis not present

## 2020-09-20 DIAGNOSIS — Z9911 Dependence on respirator [ventilator] status: Secondary | ICD-10-CM | POA: Diagnosis not present

## 2020-09-20 DIAGNOSIS — R509 Fever, unspecified: Secondary | ICD-10-CM | POA: Diagnosis not present

## 2020-09-20 DIAGNOSIS — R1110 Vomiting, unspecified: Secondary | ICD-10-CM | POA: Diagnosis not present

## 2020-09-20 DIAGNOSIS — Z9119 Patient's noncompliance with other medical treatment and regimen: Secondary | ICD-10-CM | POA: Diagnosis not present

## 2020-09-20 DIAGNOSIS — R000 Tachycardia, unspecified: Secondary | ICD-10-CM | POA: Diagnosis not present

## 2020-09-20 DIAGNOSIS — R0902 Hypoxemia: Secondary | ICD-10-CM | POA: Diagnosis not present

## 2020-09-20 DIAGNOSIS — N179 Acute kidney failure, unspecified: Secondary | ICD-10-CM | POA: Diagnosis not present

## 2020-09-20 DIAGNOSIS — J8410 Pulmonary fibrosis, unspecified: Secondary | ICD-10-CM | POA: Diagnosis present

## 2020-09-20 DIAGNOSIS — E785 Hyperlipidemia, unspecified: Secondary | ICD-10-CM | POA: Diagnosis present

## 2020-09-20 DIAGNOSIS — E1165 Type 2 diabetes mellitus with hyperglycemia: Secondary | ICD-10-CM | POA: Diagnosis present

## 2020-09-20 DIAGNOSIS — J9690 Respiratory failure, unspecified, unspecified whether with hypoxia or hypercapnia: Secondary | ICD-10-CM

## 2020-09-20 DIAGNOSIS — E038 Other specified hypothyroidism: Secondary | ICD-10-CM | POA: Diagnosis present

## 2020-09-20 DIAGNOSIS — Z452 Encounter for adjustment and management of vascular access device: Secondary | ICD-10-CM

## 2020-09-20 DIAGNOSIS — E875 Hyperkalemia: Secondary | ICD-10-CM | POA: Diagnosis not present

## 2020-09-20 DIAGNOSIS — Z7982 Long term (current) use of aspirin: Secondary | ICD-10-CM

## 2020-09-20 DIAGNOSIS — E43 Unspecified severe protein-calorie malnutrition: Secondary | ICD-10-CM | POA: Diagnosis not present

## 2020-09-20 DIAGNOSIS — J9601 Acute respiratory failure with hypoxia: Secondary | ICD-10-CM | POA: Diagnosis not present

## 2020-09-20 DIAGNOSIS — J1282 Pneumonia due to coronavirus disease 2019: Secondary | ICD-10-CM | POA: Diagnosis present

## 2020-09-20 DIAGNOSIS — Z79899 Other long term (current) drug therapy: Secondary | ICD-10-CM

## 2020-09-20 DIAGNOSIS — J479 Bronchiectasis, uncomplicated: Secondary | ICD-10-CM | POA: Diagnosis present

## 2020-09-20 DIAGNOSIS — Z4682 Encounter for fitting and adjustment of non-vascular catheter: Secondary | ICD-10-CM | POA: Diagnosis not present

## 2020-09-20 DIAGNOSIS — R578 Other shock: Secondary | ICD-10-CM | POA: Diagnosis present

## 2020-09-20 DIAGNOSIS — Z7984 Long term (current) use of oral hypoglycemic drugs: Secondary | ICD-10-CM

## 2020-09-20 DIAGNOSIS — I10 Essential (primary) hypertension: Secondary | ICD-10-CM | POA: Diagnosis present

## 2020-09-20 DIAGNOSIS — R809 Proteinuria, unspecified: Secondary | ICD-10-CM | POA: Diagnosis present

## 2020-09-20 DIAGNOSIS — D72810 Lymphocytopenia: Secondary | ICD-10-CM | POA: Diagnosis present

## 2020-09-20 DIAGNOSIS — U071 COVID-19: Principal | ICD-10-CM | POA: Diagnosis present

## 2020-09-20 DIAGNOSIS — Z515 Encounter for palliative care: Secondary | ICD-10-CM

## 2020-09-20 DIAGNOSIS — R34 Anuria and oliguria: Secondary | ICD-10-CM | POA: Diagnosis present

## 2020-09-20 DIAGNOSIS — R579 Shock, unspecified: Secondary | ICD-10-CM | POA: Diagnosis not present

## 2020-09-20 DIAGNOSIS — K219 Gastro-esophageal reflux disease without esophagitis: Secondary | ICD-10-CM | POA: Diagnosis present

## 2020-09-20 DIAGNOSIS — Z789 Other specified health status: Secondary | ICD-10-CM

## 2020-09-20 DIAGNOSIS — R0602 Shortness of breath: Secondary | ICD-10-CM | POA: Diagnosis present

## 2020-09-20 DIAGNOSIS — Z8249 Family history of ischemic heart disease and other diseases of the circulatory system: Secondary | ICD-10-CM

## 2020-09-20 DIAGNOSIS — R0603 Acute respiratory distress: Secondary | ICD-10-CM

## 2020-09-20 LAB — CBC WITH DIFFERENTIAL/PLATELET
Abs Immature Granulocytes: 0.04 10*3/uL (ref 0.00–0.07)
Basophils Absolute: 0 10*3/uL (ref 0.0–0.1)
Basophils Relative: 0 %
Eosinophils Absolute: 0 10*3/uL (ref 0.0–0.5)
Eosinophils Relative: 0 %
HCT: 48.1 % (ref 39.0–52.0)
Hemoglobin: 14.7 g/dL (ref 13.0–17.0)
Immature Granulocytes: 1 %
Lymphocytes Relative: 14 %
Lymphs Abs: 0.6 10*3/uL — ABNORMAL LOW (ref 0.7–4.0)
MCH: 24.9 pg — ABNORMAL LOW (ref 26.0–34.0)
MCHC: 30.6 g/dL (ref 30.0–36.0)
MCV: 81.4 fL (ref 80.0–100.0)
Monocytes Absolute: 0.3 10*3/uL (ref 0.1–1.0)
Monocytes Relative: 7 %
Neutro Abs: 3.4 10*3/uL (ref 1.7–7.7)
Neutrophils Relative %: 78 %
Platelets: 194 10*3/uL (ref 150–400)
RBC: 5.91 MIL/uL — ABNORMAL HIGH (ref 4.22–5.81)
RDW: 14.7 % (ref 11.5–15.5)
WBC: 4.4 10*3/uL (ref 4.0–10.5)
nRBC: 0 % (ref 0.0–0.2)

## 2020-09-20 LAB — LACTATE DEHYDROGENASE: LDH: 715 U/L — ABNORMAL HIGH (ref 98–192)

## 2020-09-20 LAB — URINALYSIS, ROUTINE W REFLEX MICROSCOPIC
Bacteria, UA: NONE SEEN
Bilirubin Urine: NEGATIVE
Glucose, UA: >=500 mg/dL — AB
Ketones, ur: 20 mg/dL — AB
Leukocytes,Ua: NEGATIVE
Nitrite: NEGATIVE
Protein, ur: >=300 mg/dL — AB
Specific Gravity, Urine: 1.023 (ref 1.005–1.030)
pH: 5 (ref 5.0–8.0)

## 2020-09-20 LAB — COMPREHENSIVE METABOLIC PANEL
ALT: 37 U/L (ref 0–44)
AST: 72 U/L — ABNORMAL HIGH (ref 15–41)
Albumin: 3.9 g/dL (ref 3.5–5.0)
Alkaline Phosphatase: 61 U/L (ref 38–126)
Anion gap: 13 (ref 5–15)
BUN: 15 mg/dL (ref 8–23)
CO2: 22 mmol/L (ref 22–32)
Calcium: 9.2 mg/dL (ref 8.9–10.3)
Chloride: 93 mmol/L — ABNORMAL LOW (ref 98–111)
Creatinine, Ser: 1.29 mg/dL — ABNORMAL HIGH (ref 0.61–1.24)
GFR, Estimated: 55 mL/min — ABNORMAL LOW (ref >60)
Glucose, Bld: 388 mg/dL — ABNORMAL HIGH (ref 70–99)
Potassium: 4.4 mmol/L (ref 3.5–5.1)
Sodium: 128 mmol/L — ABNORMAL LOW (ref 135–145)
Total Bilirubin: 0.6 mg/dL (ref 0.3–1.2)
Total Protein: 7.4 g/dL (ref 6.5–8.1)

## 2020-09-20 LAB — FIBRINOGEN: Fibrinogen: 488 mg/dL — ABNORMAL HIGH (ref 210–475)

## 2020-09-20 LAB — RESPIRATORY PANEL BY RT PCR (FLU A&B, COVID)
Influenza A by PCR: NEGATIVE
Influenza B by PCR: NEGATIVE
SARS Coronavirus 2 by RT PCR: POSITIVE — AB

## 2020-09-20 LAB — PROTIME-INR
INR: 1.1 (ref 0.8–1.2)
Prothrombin Time: 13.7 seconds (ref 11.4–15.2)

## 2020-09-20 LAB — BRAIN NATRIURETIC PEPTIDE: B Natriuretic Peptide: 22 pg/mL (ref 0.0–100.0)

## 2020-09-20 LAB — LACTIC ACID, PLASMA
Lactic Acid, Venous: 2.2 mmol/L (ref 0.5–1.9)
Lactic Acid, Venous: 2.5 mmol/L (ref 0.5–1.9)

## 2020-09-20 LAB — PROCALCITONIN: Procalcitonin: 0.41 ng/mL

## 2020-09-20 LAB — CK: Total CK: 188 U/L (ref 49–397)

## 2020-09-20 LAB — GLUCOSE, CAPILLARY: Glucose-Capillary: 372 mg/dL — ABNORMAL HIGH (ref 70–99)

## 2020-09-20 LAB — TROPONIN I (HIGH SENSITIVITY)
Troponin I (High Sensitivity): 16 ng/L (ref <18)
Troponin I (High Sensitivity): 23 ng/L — ABNORMAL HIGH (ref <18)

## 2020-09-20 LAB — D-DIMER, QUANTITATIVE: D-Dimer, Quant: 1.56 ug/mL-FEU — ABNORMAL HIGH (ref 0.00–0.50)

## 2020-09-20 LAB — FERRITIN: Ferritin: 3957 ng/mL — ABNORMAL HIGH (ref 24–336)

## 2020-09-20 MED ORDER — ACETAMINOPHEN 325 MG PO TABS
650.0000 mg | ORAL_TABLET | Freq: Four times a day (QID) | ORAL | Status: DC | PRN
  Administered 2020-09-20 – 2020-09-21 (×2): 650 mg via ORAL
  Filled 2020-09-20 (×2): qty 2

## 2020-09-20 MED ORDER — LACTATED RINGERS IV BOLUS
500.0000 mL | Freq: Once | INTRAVENOUS | Status: AC
  Administered 2020-09-20: 500 mL via INTRAVENOUS

## 2020-09-20 MED ORDER — SODIUM CHLORIDE 0.9 % IV SOLN
2.0000 g | Freq: Once | INTRAVENOUS | Status: AC
  Administered 2020-09-20: 2 g via INTRAVENOUS
  Filled 2020-09-20: qty 20

## 2020-09-20 MED ORDER — ASPIRIN EC 81 MG PO TBEC
81.0000 mg | DELAYED_RELEASE_TABLET | Freq: Every day | ORAL | Status: DC
  Administered 2020-09-20 – 2020-09-22 (×3): 81 mg via ORAL
  Filled 2020-09-20 (×3): qty 1

## 2020-09-20 MED ORDER — AMLODIPINE BESYLATE 10 MG PO TABS
10.0000 mg | ORAL_TABLET | Freq: Every day | ORAL | Status: DC
  Administered 2020-09-20 – 2020-09-23 (×4): 10 mg via ORAL
  Filled 2020-09-20: qty 2
  Filled 2020-09-20 (×6): qty 1

## 2020-09-20 MED ORDER — DEXAMETHASONE SODIUM PHOSPHATE 10 MG/ML IJ SOLN: 6.0000 mg | INTRAMUSCULAR | Status: DC

## 2020-09-20 MED ORDER — INSULIN ASPART 100 UNIT/ML ~~LOC~~ SOLN
0.0000 [IU] | Freq: Every day | SUBCUTANEOUS | Status: DC
  Administered 2020-09-20: 5 [IU] via SUBCUTANEOUS

## 2020-09-20 MED ORDER — ENOXAPARIN SODIUM 40 MG/0.4ML ~~LOC~~ SOLN
40.0000 mg | SUBCUTANEOUS | Status: DC
  Administered 2020-09-20 – 2020-09-23 (×4): 40 mg via SUBCUTANEOUS
  Filled 2020-09-20 (×5): qty 0.4

## 2020-09-20 MED ORDER — METHYLPREDNISOLONE SODIUM SUCC 125 MG IJ SOLR
1.0000 mg/kg | Freq: Two times a day (BID) | INTRAMUSCULAR | Status: DC
  Administered 2020-09-20: 100 mg via INTRAVENOUS
  Filled 2020-09-20: qty 2

## 2020-09-20 MED ORDER — DEXAMETHASONE 4 MG PO TABS: 6.0000 mg | ORAL_TABLET | Freq: Every day | ORAL | Status: DC

## 2020-09-20 MED ORDER — INSULIN GLARGINE 100 UNIT/ML ~~LOC~~ SOLN
10.0000 [IU] | Freq: Every day | SUBCUTANEOUS | Status: DC
  Administered 2020-09-20: 10 [IU] via SUBCUTANEOUS
  Filled 2020-09-20 (×2): qty 0.1

## 2020-09-20 MED ORDER — SODIUM CHLORIDE 0.9 % IV SOLN
500.0000 mg | Freq: Once | INTRAVENOUS | Status: AC
  Administered 2020-09-20: 500 mg via INTRAVENOUS
  Filled 2020-09-20: qty 500

## 2020-09-20 MED ORDER — ONDANSETRON HCL 4 MG/2ML IJ SOLN: 4.0000 mg | Freq: Four times a day (QID) | INTRAMUSCULAR | Status: DC | PRN

## 2020-09-20 MED ORDER — ONDANSETRON HCL 4 MG PO TABS: 4.0000 mg | ORAL_TABLET | Freq: Four times a day (QID) | ORAL | Status: DC | PRN

## 2020-09-20 MED ORDER — POLYETHYLENE GLYCOL 3350 17 G PO PACK: 17.0000 g | PACK | Freq: Every day | ORAL | Status: DC | PRN

## 2020-09-20 MED ORDER — SODIUM CHLORIDE 0.9 % IV SOLN
100.0000 mg | Freq: Every day | INTRAVENOUS | Status: AC
  Administered 2020-09-21 – 2020-09-24 (×4): 100 mg via INTRAVENOUS
  Filled 2020-09-20 (×4): qty 20

## 2020-09-20 MED ORDER — SODIUM CHLORIDE 0.9 % IV SOLN
200.0000 mg | Freq: Once | INTRAVENOUS | Status: AC
  Administered 2020-09-20: 200 mg via INTRAVENOUS
  Filled 2020-09-20: qty 40

## 2020-09-20 MED ORDER — PREDNISONE 20 MG PO TABS: 50.0000 mg | ORAL_TABLET | Freq: Every day | ORAL | Status: DC

## 2020-09-20 MED ORDER — DEXAMETHASONE 6 MG PO TABS
6.0000 mg | ORAL_TABLET | Freq: Every day | ORAL | Status: DC
  Administered 2020-09-21 – 2020-09-22 (×2): 6 mg via ORAL
  Filled 2020-09-20 (×2): qty 2

## 2020-09-20 MED ORDER — INSULIN ASPART 100 UNIT/ML ~~LOC~~ SOLN
0.0000 [IU] | Freq: Three times a day (TID) | SUBCUTANEOUS | Status: DC
  Administered 2020-09-21: 15 [IU] via SUBCUTANEOUS
  Administered 2020-09-21: 11 [IU] via SUBCUTANEOUS

## 2020-09-20 NOTE — ED Triage Notes (Signed)
Pt here with fatigue and shob along with ?fevers X1 week. Pt has not had covid vaccine. Denies sick contacts.

## 2020-09-20 NOTE — ED Notes (Signed)
Pt oxygen saturation in triage 63%. Placed pt on NRB at 15L with slow improvement to 91%.

## 2020-09-20 NOTE — Plan of Care (Signed)
  Problem: Respiratory: Goal: Complications related to the disease process, condition or treatment will be avoided or minimized Outcome: Progressing   Problem: Respiratory: Goal: Will maintain a patent airway Outcome: Progressing

## 2020-09-20 NOTE — Consult Note (Signed)
NAME:  Carlos Selner., MRN:  [id number], DOB:  [birth year], LOS: 0 ADMISSION DATE:  09/28/2020, CONSULTATION DATE: 09/21/2019 REFERRING MD: Claris Gower, CHIEF COMPLAINT: Dyspnea  HPI/course in hospital  69 year old man who presented with sudden onset cough shortness of breath with fever and chills over the last few days.  Known ILD and prior smoking.  Not vaccinated against Covid  In the ED initial saturation 63% started on high flow nasal cannula and nonrebreather with improvement of saturations into the 90s.  Past Medical History   Past Medical History:  Diagnosis Date  . Diabetes mellitus   . Hyperlipidemia   . Hypertension   . Subclinical hyperthyroidism   . Tobacco use      Past Surgical History:  Procedure Laterality Date  . DENTAL SURGERY    . 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     Review of Systems:   Review of Systems  Constitutional: Positive for chills, fever and malaise/fatigue.  HENT: Negative.   Eyes: Negative.   Respiratory: Positive for cough and shortness of breath. Negative for sputum production.   Cardiovascular: Positive for chest pain.  Gastrointestinal: Negative.   Genitourinary: Negative.   Musculoskeletal: Negative.   Skin: Negative.   Neurological: Negative.   Endo/Heme/Allergies: Negative.   Psychiatric/Behavioral: Negative.     Social History   reports that he quit smoking about 3 years ago. His smoking use included cigarettes. He smoked 0.50 packs per day. He has never used smokeless tobacco. He reports that he does not drink alcohol and does not use drugs.   Family History   His family history includes Breast cancer in his sister; Cancer in his paternal aunt; Colon cancer in his father; Diabetes in his sister; Gout in his mother; Heart disease in his mother; Lung cancer in his paternal uncle.   Allergies No Known Allergies   Home Medications  Prior to Admission medications   Medication Sig Start  Date End Date Taking? Authorizing Provider  amLODipine (NORVASC) 10 MG tablet Take 1 tablet (10 mg total) by mouth daily. 03/14/20 03/14/21 Yes Axel Filler, MD  aspirin EC 81 MG tablet Take 1 tablet (81 mg total) by mouth daily. 05/26/15  Yes McLean-Scocuzza, Nino Glow, MD  cetirizine (ZYRTEC ALLERGY) 10 MG tablet Take 1 tablet (10 mg total) by mouth daily. 03/14/20 03/14/21 Yes Axel Filler, MD  Chlorphen-Phenyleph-ASA (ALKA-SELTZER PLUS COLD) 2-7.8-325 MG TBEF Take 2 tablets by mouth every 6 (six) hours as needed (cough).   Yes [provider]  Cholecalciferol (VITAMIN D3) 10 MCG (400 UNIT) CAPS Take 400 Units by mouth daily.   Yes [provider]  lisinopril (ZESTRIL) 40 MG tablet Take 1 tablet (40 mg total) by mouth daily. 03/14/20  Yes Axel Filler, MD  metFORMIN (GLUCOPHAGE) 1000 MG tablet Take 1 tablet (1,000 mg total) by mouth 2 (two) times daily with a meal. 03/14/20 03/14/21 Yes Axel Filler, MD  Blood Glucose Monitoring Suppl [facility] VERIO) w/Device KIT Check blood sugar 1 time a day 03/24/19   Axel Filler, MD  OneTouch Delica Lancets 80S MISC Use to check blood sugar 1 time a day 03/24/19   Axel Filler, MD  [facility] VERIO test strip USE  STRIP TO CHECK GLUCOSE ONCE DAILY 07/12/20   Axel Filler, MD  sitaGLIPtin (JANUVIA) 50 MG tablet Take 1 tablet (50 mg total) by mouth daily. Patient not taking: Reported on 09/11/2020 06/20/20   Evette Doffing,  Mallie Mussel, MD     Interim history/subjective:  Presently, feels that his dyspnea has improved since arrival.  Objective   Blood pressure 118/76, pulse 96, temperature (!) 100.8 F (38.2 C), temperature source Oral, resp. rate (!) 25, height '5\' 11"'  (1.803 m), weight 99.8 kg, SpO2 95 %.        Intake/Output Summary (Last 24 hours) at 09/09/2020 1747 Last data filed at 10/07/2020 1434 Gross per 24 hour  Intake 600 ml  Output --  Net 600 ml   Filed Weights   10/01/2020  1040  Weight: 99.8 kg    Examination: Physical Exam Constitutional:      General: He is not in acute distress.    Appearance: Normal appearance. He is not toxic-appearing.  HENT:     Mouth/Throat:     Mouth: Mucous membranes are moist.  Eyes:     Conjunctiva/sclera: Conjunctivae normal.  Neck:     Comments: Trachea midline Cardiovascular:     Rate and Rhythm: Normal rate and regular rhythm.     Heart sounds: Normal heart sounds.  Pulmonary:     Breath sounds: Rales (Fine crackles diffusely at both bases) present.  Abdominal:     General: Abdomen is flat.  Musculoskeletal:     Comments: There is no peripheral edema  Skin:    General: Skin is warm.  Neurological:     General: No focal deficit present.     Mental Status: He is alert.      Ancillary tests (personally reviewed)  CBC: Recent Labs  Lab 10/03/2020 1045  WBC 4.4  NEUTROABS 3.4  HGB 14.7  HCT 48.1  MCV 81.4  PLT 088    Basic Metabolic Panel: Recent Labs  Lab 09/09/2020 1045  NA 128*  K 4.4  CL 93*  CO2 22  GLUCOSE 388*  BUN 15  CREATININE 1.29*  CALCIUM 9.2   GFR: Estimated Creatinine Clearance: 63.2 mL/min (A) (by C-G formula based on SCr of 1.29 mg/dL (H)). Recent Labs  Lab 10/01/2020 1045 09/30/2020 1149 09/16/2020 1240  WBC 4.4  --   --   LATICACIDVEN  --  2.2* 2.5*    Liver Function Tests: Recent Labs  Lab 09/22/2020 1045  AST 72*  ALT 37  ALKPHOS 61  BILITOT 0.6  PROT 7.4  ALBUMIN 3.9   No results for input(s): LIPASE, AMYLASE in the last 168 hours. No results for input(s): AMMONIA in the last 168 hours.  ABG No results found for: PHART, PCO2ART, PO2ART, HCO3, TCO2, ACIDBASEDEF, O2SAT   Coagulation Profile: Recent Labs  Lab 09/17/2020 1045  INR 1.1    Cardiac Enzymes: No results for input(s): CKTOTAL, CKMB, CKMBINDEX, TROPONINI in the last 168 hours.  HbA1C: Hemoglobin A1C  Date/Time Value Ref Range Status  06/20/2020 08:58 AM 8.3 (A) 4.0 - 5.6 % Final  03/14/2020  08:36 AM 7.4 (A) 4.0 - 5.6 % Final   Hgb A1c MFr Bld  Date/Time Value Ref Range Status  04/28/2019 06:00 AM 10.3 (H) 4.8 - 5.6 % Final    Comment:    (NOTE) Pre diabetes:          5.7%-6.4% Diabetes:              >6.4% Glycemic control for   <7.0% adults with diabetes   10/22/2010 12:00 AM 8.9 %     CBG: No results for input(s): GLUCAP in the last 168 hours.   Assessment & Plan:   Acute hypoxic respiratory failure secondary  to COVID-19 pneumonia on background history of ILD. Abrupt onset with fever most consistent with COVID-19.  Unlikely ILD flare.  We will therefore focus on COVID-19 management and limit steroid exposure to usual doses. Dexamethasone 6 mg daily for 10 days should be sufficient and may limit steroid induced fluid overload as well as muscular weakness.  At the present time, should remain on 2 W.  No indication for ICU stay at this point  Would recommend starting heated high flow oxygen.  Encourage ambulation in the room as tolerated, self proning, incentive spirometry.  Please reconsult if patient's condition deteriorates.   Kipp Brood, MD [facility] ICU Physician [place]  Pager: [phone] Mobile: [phone] After hours: [phone].  09/28/2020, 5:47 PM

## 2020-09-20 NOTE — Progress Notes (Signed)
Things That May Be Affecting Your Health:  Alcohol  Hearing loss  Pain    Depression  Home Safety  Sexual Health   Diabetes  Lack of physical activity  Stress   Difficulty with daily activities  Loneliness x Tiredness   Drug use  Medicines  Tobacco use  x Falls  Motor Vehicle Safety  Weight   Food choices  Oral Health  Other    YOUR PERSONALIZED HEALTH PLAN : 1. Schedule your next subsequent Medicare Wellness visit in one year 2. Attend all of your regular appointments to address your medical issues 3. Complete the preventative screenings and services   Annual Wellness Visit   Medicare Covered Preventative Screenings and [place] Men and Women Who How Often Need? Date of Last Service Action  Abdominal Aortic Aneurysm Adults with AAA risk factors Once     Alcohol Misuse and Counseling All Adults Screening once a year if no alcohol misuse. Counseling up to 4 face to face sessions.     Bone Density Measurement  Adults at risk for osteoporosis Once every 2 yrs     Lipid Panel Z13.6 All adults without CV disease Once every 5 yrs     Colorectal Cancer   Stool sample or  Colonoscopy All adults 67 and older   Once every year  Every 10 years  Yes    Depression All Adults Once a year  Today   Diabetes Screening Blood glucose, post glucose load, or GTT Z13.1  All adults at risk  Pre-diabetics  Once per year  Twice per year     Diabetes  Self-Management Training All adults Diabetics 10 hrs first year; 2 hours subsequent years. Requires Copay     Glaucoma  Diabetics  Family history of glaucoma  African Americans 29 yrs +  Hispanic Americans 65 yrs + Annually - requires coppay     Hepatitis C Z72.89 or F19.20  High Risk for HCV  Born between 1945 and 1965  Annually  Once     HIV Z11.4 All adults based on risk  Annually btw ages 100 & 41 regardless of risk  Annually > 65 yrs if at increased risk     Lung Cancer Screening Asymptomatic adults  aged 10-77 with 30 pack yr history and current smoker OR quit within the last 15 yrs Annually Must have counseling and shared decision making documentation before first screen     Medical Nutrition Therapy Adults with   Diabetes  Renal disease  Kidney transplant within past 3 yrs 3 hours first year; 2 hours subsequent years     Obesity and Counseling All adults Screening once a year Counseling if BMI 30 or higher  Today   Tobacco Use Counseling Adults who use tobacco  Up to 8 visits in one year     Vaccines Z23  Hepatitis B  Influenza   Pneumonia  Adults   Once  Once every flu season  Two different vaccines separated by one year  Yes, and covid    Next Annual Wellness Visit People with Medicare Every year  Today     Services & Screenings Women Who How Often Need  Date of Last Service Action  Mammogram  Z12.31 Women over 63 One baseline ages 33-39. Annually ager 40 yrs+     Pap tests All women Annually if high risk. Every 2 yrs for normal risk women     Screening for cervical cancer with   Pap (Z01.419 nl  or Z01.411abnl) &  HPV Z11.51 Women aged 24 to 88 Once every 5 yrs     Screening pelvic and breast exams All women Annually if high risk. Every 2 yrs for normal risk women     Sexually Transmitted Diseases  Chlamydia  Gonorrhea  Syphilis All at risk adults Annually for non pregnant females at increased risk         [place] Men Who How Ofter Need  Date of Last Service Action  Prostate Cancer - DRE & PSA Men over 50 Annually.  DRE might require a copay.     Sexually Transmitted Diseases  Syphilis All at risk adults Annually for men at increased risk

## 2020-09-20 NOTE — Telephone Encounter (Signed)
Sorry to hear this. I agree.

## 2020-09-20 NOTE — Telephone Encounter (Signed)
Call from pt - stating not feeling well; sounds short of breath while talking. C/o sob, cough,chills, loss of taste. Only able to eat cereal. He's home with his wife; asked how's she doing- states she feeling weak also. Advise pt to call EMS or go to the ED now. States ok.

## 2020-09-20 NOTE — ED Provider Notes (Signed)
[facility] DEPARTMENT Provider Note   CSN: [id number] Arrival date & time: 09/15/2020  1016     History No chief complaint on file.   Carlos Caydyn Sprung. is a 69 y.o. male with hospital history of hypertension, diabetes, history of tobacco abuse, who presents to the ED for dyspnea, hypoxemia and fever.  Patient states that he has been more short of breath the last 3 months, especially with exertion.  He also complained of chest pain that comes and go, locates at mid lower chest, nothing makes better or worse, described dull and aching.  Patient denies lower extremities edema.  Patient states that he is not using oxygen at home and also not using any inhalers.  He quit smoking 5 years ago, used to smoke more than 1 pack a day for 50 years.  Denies alcohol or drug use.  He states that his wife is also feeling sick few days ago.   HPI     Past Medical History:  Diagnosis Date  . Diabetes mellitus   . Hyperlipidemia   . Hypertension   . Subclinical hyperthyroidism   . Tobacco use     Patient Active Problem List   Diagnosis Date Noted  . COVID-19 09/30/2020  . Pulmonary fibrosis ([place]) 09/27/2020  . 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 Encounter for screening for lung cancer 03/14/2020  . Interstitial lung disease ([place]) 03/14/2020  . Obesity (BMI 30.0-34.9) 04/29/2017  . Vitamin D deficiency 06/10/2016  . Health care maintenance 12/31/2013  . Allergic rhinitis 04/22/2013  . Hypertension 11/23/2011  . Type 2 diabetes mellitus with other specified complication (Ages) 40/34/7425  . Dyslipidemia 11/08/2010    Past Surgical History:  Procedure Laterality Date  . DENTAL SURGERY    . 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       Family History  Problem Relation Age of Onset  . Colon cancer Father        Died at age 35 due to it.  Marland Kitchen Heart disease Mother   . Gout Mother   . Diabetes Sister   . Breast cancer Sister   .  Lung cancer Paternal Uncle   . Cancer Paternal Aunt        Unknown cancer    Social History   Tobacco Use  . Smoking status: Former Smoker    Packs/day: 0.50    Types: Cigarettes    Quit date: 05/06/2017    Years since quitting: 3.3  . Smokeless tobacco: Never Used  . Tobacco comment: cutting back still smoking 4.5 packs per week  Vaping Use  . Vaping Use: Never used  Substance Use Topics  . Alcohol use: No  . Drug use: No    Home Medications Prior to Admission medications   Medication Sig Start Date End Date Taking? Authorizing Provider  amLODipine (NORVASC) 10 MG tablet Take 1 tablet (10 mg total) by mouth daily. 03/14/20 03/14/21  Axel Filler, MD  aspirin EC 81 MG tablet Take 1 tablet (81 mg total) by mouth daily. 05/26/15   McLean-Scocuzza, Nino Glow, MD  Blood Glucose Monitoring Suppl [facility] VERIO) w/Device KIT Check blood sugar 1 time a day 03/24/19   Axel Filler, MD  cetirizine (ZYRTEC ALLERGY) 10 MG tablet Take 1 tablet (10 mg total) by mouth daily. 03/14/20 03/14/21  Axel Filler, MD  Cholecalciferol (VITAMIN D3) 10 MCG (400 UNIT) CAPS Take 400 Units by mouth daily.  [provider]  lisinopril (ZESTRIL) 40 MG tablet Take 1 tablet (40 mg total) by mouth daily. 03/14/20   Axel Filler, MD  metFORMIN (GLUCOPHAGE) 1000 MG tablet Take 1 tablet (1,000 mg total) by mouth 2 (two) times daily with a meal. 03/14/20 03/14/21  Axel Filler, MD  OneTouch Delica Lancets 23F MISC Use to check blood sugar 1 time a day 03/24/19   Axel Filler, MD  [facility] VERIO test strip USE  STRIP TO CHECK GLUCOSE ONCE DAILY 07/12/20   Axel Filler, MD  sitaGLIPtin (JANUVIA) 50 MG tablet Take 1 tablet (50 mg total) by mouth daily. 06/20/20   Axel Filler, MD    Allergies    Patient has no known allergies.  Review of Systems   Review of Systems  Respiratory: Positive for cough, chest tightness and shortness of breath.     Cardiovascular: Positive for chest pain. Negative for palpitations and leg swelling.  Gastrointestinal: Negative for abdominal pain.    Physical Exam Updated Vital Signs BP 138/87   Pulse 92   Temp (!) 102.8 F (39.3 C) (Oral)   Resp (!) 24   Ht '5\' 11"'  (1.803 m)   Wt 99.8 kg   SpO2 96%   BMI 30.68 kg/m   Physical Exam Constitutional:      General: He is in acute distress.  HENT:     Head: Normocephalic.  Eyes:     General:        Right eye: No discharge.        Left eye: No discharge.  Cardiovascular:     Rate and Rhythm: Normal rate and regular rhythm.     Comments: JVD Pulmonary:     Breath sounds: No wheezing.     Comments: Mild crackles noted bilateral lung bases Abdominal:     General: Bowel sounds are normal.     Palpations: Abdomen is soft.     Tenderness: There is no abdominal tenderness.  Musculoskeletal:     Cervical back: Normal range of motion.     Right lower leg: No edema.     Left lower leg: No edema.  Skin:    General: Skin is warm.     ED Results / Procedures / Treatments   Labs (all labs ordered are listed, but only abnormal results are displayed) Labs Reviewed  RESPIRATORY PANEL BY RT PCR (FLU A&B, COVID) - Abnormal; Notable for the following components:      Result Value   SARS Coronavirus 2 by RT PCR POSITIVE (*)    All other components within normal limits  COMPREHENSIVE METABOLIC PANEL - Abnormal; Notable for the following components:   Sodium 128 (*)    Chloride 93 (*)    Glucose, Bld 388 (*)    Creatinine, Ser 1.29 (*)    AST 72 (*)    GFR, Estimated 55 (*)    All other components within normal limits  LACTIC ACID, PLASMA - Abnormal; Notable for the following components:   Lactic Acid, Venous 2.2 (*)    All other components within normal limits  CBC WITH DIFFERENTIAL/PLATELET - Abnormal; Notable for the following components:   RBC 5.91 (*)    MCH 24.9 (*)    Lymphs Abs 0.6 (*)    All other components within normal limits   URINALYSIS, ROUTINE W REFLEX MICROSCOPIC - Abnormal; Notable for the following components:   APPearance HAZY (*)    Glucose, UA >=500 (*)    Hgb  urine dipstick MODERATE (*)    Ketones, ur 20 (*)    Protein, ur >=300 (*)    All other components within normal limits  CULTURE, BLOOD (ROUTINE X 2)  CULTURE, BLOOD (ROUTINE X 2)  PROTIME-INR  BRAIN NATRIURETIC PEPTIDE  LACTIC ACID, PLASMA  TROPONIN I (HIGH SENSITIVITY)  TROPONIN I (HIGH SENSITIVITY)    EKG EKG Interpretation  Date/Time:  Tuesday September 20 2020 10:32:12 EDT Ventricular Rate:  117 PR Interval:  136 QRS Duration: 84 QT Interval:  348 QTC Calculation: 485 R Axis:   -111 Text Interpretation: Sinus tachycardia Right superior axis deviation Right ventricular hypertrophy Abnormal ECG No significant change since last tracing Confirmed by Deno Etienne [phone]) on 10/08/2020 10:51:49 AM   Radiology DG Chest Portable 1 View  Result Date: 09/09/2020 CLINICAL DATA:  Fatigue, shortness of breath, and fever.  Hypoxia. EXAM: PORTABLE CHEST 1 VIEW COMPARISON:  Chest radiograph 04/26/2019 and chest CT 07/22/2020 FINDINGS: The cardiac silhouette is upper limits of normal in size. The lungs are mildly hypoinflated. Interstitial densities are present in the lungs bilaterally, greatest in the bases. These have increased from the 2020 chest radiograph, most notably in the right upper lobe, however there is likely less of a change compared to the interval CT. No sizable pleural effusion or pneumothorax is identified. No acute osseous abnormality is seen. IMPRESSION: Chronic interstitial lung disease, possibly with superimposed atypical or viral infection. Electronically Signed   By: Logan Bores M.D.   On: 09/12/2020 11:19    Procedures Procedures (including critical care time)  Medications Ordered in ED Medications  remdesivir 200 mg in sodium chloride 0.9% 250 mL IVPB (has no administration in time range)    Followed by  remdesivir 100  mg in sodium chloride 0.9 % 100 mL IVPB (has no administration in time range)  acetaminophen (TYLENOL) tablet 650 mg (has no administration in time range)  cefTRIAXone (ROCEPHIN) 2 g in sodium chloride 0.9 % 100 mL IVPB (0 g Intravenous Stopped 09/30/2020 1224)  azithromycin (ZITHROMAX) 500 mg in sodium chloride 0.9 % 250 mL IVPB (0 mg Intravenous Stopped 09/14/2020 1333)    ED Course  I have reviewed the triage vital signs and the nursing notes.  Pertinent labs & imaging results that were available during my care of the patient were reviewed by me and considered in my medical decision making (see chart for details).  Patient seen and examined.  He is in acute distress, dyspnea and hyperventilating.  Patient is on nonrebreather mask nasal cannula.  Satting in the 59s.  Patient is not on oxygen at home and not using any inhalers.  Sick contacts include his wife who is also not feeling well.  Differentials include COPD exacerbation, COVID-19 infection, pneumonia CHF, ACS or PE.  BP (!) 157/85   Pulse (!) 113   Temp (!) 102.8 F (39.3 C) (Oral)   Resp (!) 26   Ht '5\' 11"'  (1.803 m)   Wt 99.8 kg   SpO2 91%   BMI 30.68 kg/m   Covid test came back positive.  Patient will be admitted to the internal medicine service.    MDM Rules/Calculators/A&P                          Patient presents to the ED for worsening shortness of breath.  He is not on oxygen at home.  In the ED he requires 30 L with nonrebreather and nasal cannula to maintain sat  in the low 90s.  Covid test came back positive.  Patient also have history of pulmonary fibrosis evidence on CT chest 07/2020.  Patient will be admitted to the internal medicine teaching service for further management.  Carlos Bowman. was evaluated in Emergency Department on 09/09/2020 for the symptoms described in the history of present illness. He was evaluated in the context of the global COVID-19 pandemic, which necessitated consideration that the patient  might be at risk for infection with the SARS-CoV-2 virus that causes COVID-19. Institutional protocols and algorithms that pertain to the evaluation of patients at risk for COVID-19 are in a state of rapid change based on information released by regulatory bodies including the CDC and federal and state organizations. These policies and algorithms were followed during the patient's care in the ED.  Final Clinical Impression(s) / ED Diagnoses Final diagnoses:  COVID-19  Pulmonary fibrosis [facility])    Rx / DC Orders ED Discharge Orders    None       Gaylan Gerold, DO 10/06/2020 [place], DO 09/28/2020 1425

## 2020-09-20 NOTE — H&P (Addendum)
Date: 09/25/2020               Patient Name:  Carlos Bowman. MRN: [id number]  DOB: [birth year] Age / Sex: 69 y.o., male   PCP: Axel Filler, MD         Medical Service: Internal Medicine Teaching Service         Attending Physician: Dr. Lucious Groves, DO    First Contact: Dr. Johnney Ou  Pager: [phone]  Second Contact: Dr. Gilford Rile Pager: [phone]       After Hours (After 5p/  First Contact Pager: [phone]  weekends / holidays): Second Contact Pager: [phone]   Chief Complaint: Shortness of breath  History of Present Illness: 69 year old male with ILD, HTN, HLD, DM2, subclinical hypothyroidism, prior Hx of tobacco use, who presented to ED due to not feeling good and having cough and SOB when walking and on and off fever and chills. Also has change in taste sensation. He sometime has phlemn. He reports some little amount of blood when he coughs. He had some chest pain but mostly when he lays down but he thinks it was similar to his GERD discomfort. He mentions that chest pain resolved. Her wife who lives with her also feels weak but patient is not sure and not aware if she has more symptoms. Patient called internal medicine center today concerning of his symptoms and because he did not feel good and recommended to call EMS or go the the emergency room. Of note, patient was diagnosed with ILD/IPF few months ago and follows with Dr. Vaughan Browner out-patient. Patient is not vaccinated against COVID-19. He mentions that it is because he has sever fear of needles.   ED course:  Pt O2 sat on arrival was 63%. He was febrile, tachycardic and tachypnic. Placed pt on NRB at 15L with slow improvement to 91%. And then additional 15 li through [facility] [place]. In ED his COVID test was positive. Started on Remdesivir, received Ceftriaxone and azithromycin in ED and IMTS was consulted for admission.  Meds:  Current Meds  Medication Sig  . amLODipine (NORVASC) 10 MG tablet Take 1 tablet (10 mg  total) by mouth daily.  Marland Kitchen aspirin EC 81 MG tablet Take 1 tablet (81 mg total) by mouth daily.  . cetirizine (ZYRTEC ALLERGY) 10 MG tablet Take 1 tablet (10 mg total) by mouth daily.  . Chlorphen-Phenyleph-ASA (ALKA-SELTZER PLUS COLD) 2-7.8-325 MG TBEF Take 2 tablets by mouth every 6 (six) hours as needed (cough).  . Cholecalciferol (VITAMIN D3) 10 MCG (400 UNIT) CAPS Take 400 Units by mouth daily.  Marland Kitchen lisinopril (ZESTRIL) 40 MG tablet Take 1 tablet (40 mg total) by mouth daily.  . metFORMIN (GLUCOPHAGE) 1000 MG tablet Take 1 tablet (1,000 mg total) by mouth 2 (two) times daily with a meal.     Allergies: Allergies as of 10/06/2020  . (No Known Allergies)   Past Medical History:  Diagnosis Date  . Diabetes mellitus   . Hyperlipidemia   . Hypertension   . Subclinical hyperthyroidism   . Tobacco use     Family History:  Emphysema and smoking Hx in sisters  Social History:  He lives with her wife. Former smoker (Hx of heavy smoking, 50-pack-year smoker.  Quit in 2016.), no alcohol use, no illicit drug. He is retired. No known hx of extensive exposure to mold, hot tubs,..  Review of Systems: A complete ROS was negative except as per HPI.   Physical Exam: Blood pressure  134/87, pulse 93, temperature (!) 102.8 F (39.3 C), temperature source Oral, resp. rate (!) 28, height 5\' 11"  (1.803 m), weight 99.8 kg, SpO2 90 %. Physical Exam Constitutional:      Appearance: He is ill-appearing.  Eyes:     Extraocular Movements: Extraocular movements intact.  Cardiovascular:     Rate and Rhythm: Regular rhythm.     Pulses: Normal pulses.  Pulmonary:     Effort: No respiratory distress.     Breath sounds: Rales present.     Comments: Tachypnea Abdominal:     General: There is no distension.     Palpations: Abdomen is soft.     Tenderness: There is no abdominal tenderness. There is no guarding.  Musculoskeletal:        General: No swelling or tenderness.     Right lower leg: No edema.       Left lower leg: No edema.  Skin:    General: Skin is warm and dry.     Comments: Clubbing  Neurological:     General: No focal deficit present.     Mental Status: He is alert and oriented to person, place, and time.  Psychiatric:        Mood and Affect: Mood normal.        Behavior: Behavior normal.    EKG: personally reviewed my interpretation is sinus tach with rate of 117, poor R progression. No acute ST T changes.  CXR: personally reviewed my interpretation is bilateral intrastitial marking (chronic interstitial lung disease), possibly with superimposed PNA, (atypical or viral infection per radiology report)  Assessment & Plan by Problem: Active Problems:   COVID-19   Pulmonary fibrosis ([place])   COVID-19 virus infection  COVID-19 PNA Hx of ILD Patient presented with 1 week Hx of worsening of SOB, fever and cough. He was febrile at 102.9, tachycardic and hypoxic at 63% on arrival. Pt O2 sat on arrival was 63%. Placed pt on NRB at 15L with slow improvement to 91%. He then required additional 15 li of O2 through [facility] [place]. (Currently on total of 30 li) In ED his COVID test was positive. CXR showed chronic interstitial lung disease, possibly with superimposed atypical or viral infection. Started on Remdesivir, received Ceftriaxone and azithromycin. WBC 4.4 with mild lymphopenia 0.6. Troponin was ordered given pr reports chest pain. (Does not sound like a typical angina though) Patient is not vaccinated for COVID-19. He mentions that he has sever fear of needle and that is why he did not receive COVID-19 shot.needle  -Ordering imflammatory marker  -Solumedrol 1mg /kg BID (given Hx of ILD) today. Can switch prednisonto Decadron tomorrow (pending PCCM rec) -LA pending -Troponin pending -BC pending -Received one dose of Ceftriaxone and Azithromycin in ED empirically. No focal infiltration in CXR. Pro cal is pending. Held off of ABx. Pending PCCM rec -Given high O2 requirement and  underlying lung disease, he is at high risk for decompensation. Consulting PCCM for further recommendations  ADDENDUM, LA elevated at 2.2>2.5. Also has AKI. Will give gentle IV fluid.  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 Rolla Etienne out patient. Likely IPF but per Dr. Matilde Bash note, it can also be UIP. Work up showed elevated ANA titer but otherwise unremarkable. High-resolution CT 07/22/2020-emphysema, basilar predominant pulmonary fibrosis with traction bronchiectasis, enlarged pulmonary artery. PFT 03/25/2020 FVC 2.83 [71%], FEV1 2.43 [81%], F/F 86, TLC 4.52 [62%], DLCO 13.98 [52%] Moderate-severe restriction, diffusion defect -Appreciate Pulm recommendations  HTN: Home meds: On amlodipine  10 mg daily, lisinopril 40 mg daily  Pseudohyponatremia: Na 128, corrected Na with hyperglycemia 133-135. -No further management   BP on arrival nl at 138/89  AKI: Cr 1.29 today from 0.9 at baseline UA with HB and no CBC. Can be suggestive of rhabdo. But will hold off of IVF for now.  Urinalysis:    Component Value Date/Time   COLORURINE YELLOW 09/09/2020 1205   APPEARANCEUR HAZY (A) 10/08/2020 1205   LABSPEC 1.023 09/10/2020 1205   PHURINE 5.0 09/19/2020 1205   GLUCOSEU >=500 (A) 09/18/2020 1205   HGBUR MODERATE (A) 09/14/2020 1205   BILIRUBINUR NEGATIVE 09/17/2020 1205   KETONESUR 20 (A) 10/03/2020 1205   PROTEINUR >=300 (A) 09/18/2020 1205   NITRITE NEGATIVE 10/03/2020 1205   LEUKOCYTESUR NEGATIVE 09/19/2020 1205   BMP Latest Ref Rng & Units 09/18/2020 06/20/2020 05/07/2019  Glucose 70 - 99 mg/dL 388(H) 172(H) 83  BUN 8 - 23 mg/dL 15 11 10   Creatinine 0.61 - 1.24 mg/dL 1.29(H) 0.99 0.97  BUN/Creat Ratio 10 - 24 - 11 10  Sodium 135 - 145 mmol/L 128(L) 140 140  Potassium 3.5 - 5.1 mmol/L 4.4 4.5 5.3(H)  Chloride 98 - 111 mmol/L 93(L) 103 99  CO2 22 - 32 mmol/L 22 22 25   Calcium 8.9 - 10.3 mg/dL 9.2 9.9 10.0   -Holding home ACE I -Monitor BMP -CK -Gentle IV fluid    DM2: Last HbA1c: 8.3 on 06/20/2020 Home meds: Patient is on Metformin 1000 mg twice daily and Januvia (sitagliptin) 50 mg daily (not sure if  He is hyperglycemic with blood sugar 388 now.  Bicarb is normal at 22 and no anion gap.  -Moderate SSI and lantus 10 u QHS -CBG monitoring  BMP Latest Ref Rng & Units 09/24/2020 06/20/2020 05/07/2019  Glucose 70 - 99 mg/dL 388(H) 172(H) 83  BUN 8 - 23 mg/dL 15 11 10   Creatinine 0.61 - 1.24 mg/dL 1.29(H) 0.99 0.97  BUN/Creat Ratio 10 - 24 - 11 10  Sodium 135 - 145 mmol/L 128(L) 140 140  Potassium 3.5 - 5.1 mmol/L 4.4 4.5 5.3(H)  Chloride 98 - 111 mmol/L 93(L) 103 99  CO2 22 - 32 mmol/L 22 22 25   Calcium 8.9 - 10.3 mg/dL 9.2 9.9 10.0   Diet: HH/CM IV fluid: LR 500 ml bolus VTE ppx: Lovenox Code status: Full  Dispo: Admit patient to Inpatient with expected length of stay greater than 2 midnights.  SignedDewayne Hatch, MD 09/14/2020, 3:26 PM  Pager: [phone] After 5pm on weekdays and 1pm on weekends: On Call pager: [phone]

## 2020-09-20 NOTE — Telephone Encounter (Signed)
F/U - per Epic, pt is currently in the ED. 

## 2020-09-21 DIAGNOSIS — U071 COVID-19: Secondary | ICD-10-CM | POA: Diagnosis not present

## 2020-09-21 LAB — COMPREHENSIVE METABOLIC PANEL
ALT: 39 U/L (ref 0–44)
AST: 107 U/L — ABNORMAL HIGH (ref 15–41)
Albumin: 3.6 g/dL (ref 3.5–5.0)
Alkaline Phosphatase: 76 U/L (ref 38–126)
Anion gap: 14 (ref 5–15)
BUN: 18 mg/dL (ref 8–23)
CO2: 22 mmol/L (ref 22–32)
Calcium: 9 mg/dL (ref 8.9–10.3)
Chloride: 96 mmol/L — ABNORMAL LOW (ref 98–111)
Creatinine, Ser: 1.2 mg/dL (ref 0.61–1.24)
GFR, Estimated: >60 mL/min (ref >60)
Glucose, Bld: 351 mg/dL — ABNORMAL HIGH (ref 70–99)
Potassium: 4.7 mmol/L (ref 3.5–5.1)
Sodium: 132 mmol/L — ABNORMAL LOW (ref 135–145)
Total Bilirubin: 0.3 mg/dL (ref 0.3–1.2)
Total Protein: 7.1 g/dL (ref 6.5–8.1)

## 2020-09-21 LAB — CBC WITH DIFFERENTIAL/PLATELET
Abs Immature Granulocytes: 0.03 10*3/uL (ref 0.00–0.07)
Basophils Absolute: 0 10*3/uL (ref 0.0–0.1)
Basophils Relative: 0 %
Eosinophils Absolute: 0 10*3/uL (ref 0.0–0.5)
Eosinophils Relative: 0 %
HCT: 47.9 % (ref 39.0–52.0)
Hemoglobin: 14.8 g/dL (ref 13.0–17.0)
Immature Granulocytes: 1 %
Lymphocytes Relative: 14 %
Lymphs Abs: 0.7 10*3/uL (ref 0.7–4.0)
MCH: 25 pg — ABNORMAL LOW (ref 26.0–34.0)
MCHC: 30.9 g/dL (ref 30.0–36.0)
MCV: 80.8 fL (ref 80.0–100.0)
Monocytes Absolute: 0.2 10*3/uL (ref 0.1–1.0)
Monocytes Relative: 5 %
Neutro Abs: 4.4 10*3/uL (ref 1.7–7.7)
Neutrophils Relative %: 80 %
Platelets: 201 10*3/uL (ref 150–400)
RBC: 5.93 MIL/uL — ABNORMAL HIGH (ref 4.22–5.81)
RDW: 14.8 % (ref 11.5–15.5)
WBC: 5.4 10*3/uL (ref 4.0–10.5)
nRBC: 0 % (ref 0.0–0.2)

## 2020-09-21 LAB — C-REACTIVE PROTEIN: CRP: 10.5 mg/dL — ABNORMAL HIGH (ref <1.0)

## 2020-09-21 LAB — FERRITIN: Ferritin: 7415 ng/mL — ABNORMAL HIGH (ref 24–336)

## 2020-09-21 LAB — GLUCOSE, CAPILLARY
Glucose-Capillary: 367 mg/dL — ABNORMAL HIGH (ref 70–99)
Glucose-Capillary: 374 mg/dL — ABNORMAL HIGH (ref 70–99)
Glucose-Capillary: 344 mg/dL — ABNORMAL HIGH (ref 70–99)
Glucose-Capillary: 396 mg/dL — ABNORMAL HIGH (ref 70–99)

## 2020-09-21 LAB — MAGNESIUM: Magnesium: 1.9 mg/dL (ref 1.7–2.4)

## 2020-09-21 LAB — D-DIMER, QUANTITATIVE: D-Dimer, Quant: 2.27 ug/mL-FEU — ABNORMAL HIGH (ref 0.00–0.50)

## 2020-09-21 LAB — PHOSPHORUS: Phosphorus: 3.1 mg/dL (ref 2.5–4.6)

## 2020-09-21 MED ORDER — ALBUTEROL SULFATE HFA 108 (90 BASE) MCG/ACT IN AERS: 2.0000 | INHALATION_SPRAY | Freq: Once | RESPIRATORY_TRACT | Status: DC | PRN

## 2020-09-21 MED ORDER — SODIUM CHLORIDE 0.9 % IV SOLN: Freq: Once | INTRAVENOUS | Status: DC

## 2020-09-21 MED ORDER — INSULIN ASPART 100 UNIT/ML ~~LOC~~ SOLN
0.0000 [IU] | Freq: Three times a day (TID) | SUBCUTANEOUS | Status: DC
  Administered 2020-09-21: 15 [IU] via SUBCUTANEOUS
  Administered 2020-09-22 (×2): 20 [IU] via SUBCUTANEOUS

## 2020-09-21 MED ORDER — FAMOTIDINE IN NACL 20-0.9 MG/50ML-% IV SOLN: 20.0000 mg | Freq: Once | INTRAVENOUS | Status: DC | PRN

## 2020-09-21 MED ORDER — METHYLPREDNISOLONE SODIUM SUCC 125 MG IJ SOLR: 125.0000 mg | Freq: Once | INTRAMUSCULAR | Status: DC | PRN

## 2020-09-21 MED ORDER — INSULIN GLARGINE 100 UNIT/ML ~~LOC~~ SOLN
20.0000 [IU] | Freq: Every day | SUBCUTANEOUS | Status: DC
  Administered 2020-09-21: 20 [IU] via SUBCUTANEOUS
  Filled 2020-09-21 (×2): qty 0.2

## 2020-09-21 MED ORDER — GUAIFENESIN ER 600 MG PO TB12
600.0000 mg | ORAL_TABLET | Freq: Two times a day (BID) | ORAL | Status: DC | PRN
  Administered 2020-09-21 (×2): 600 mg via ORAL
  Filled 2020-09-21 (×2): qty 1

## 2020-09-21 MED ORDER — INSULIN DETEMIR 100 UNIT/ML ~~LOC~~ SOLN
15.0000 [IU] | Freq: Every day | SUBCUTANEOUS | Status: DC
  Filled 2020-09-21: qty 0.15

## 2020-09-21 MED ORDER — EPINEPHRINE 0.3 MG/0.3ML IJ SOAJ: 0.3000 mg | Freq: Once | INTRAMUSCULAR | Status: DC | PRN

## 2020-09-21 MED ORDER — DIPHENHYDRAMINE HCL 50 MG/ML IJ SOLN: 50.0000 mg | Freq: Once | INTRAMUSCULAR | Status: DC | PRN

## 2020-09-21 MED ORDER — INSULIN DETEMIR 100 UNIT/ML ~~LOC~~ SOLN
15.0000 [IU] | Freq: Two times a day (BID) | SUBCUTANEOUS | Status: DC
  Filled 2020-09-21: qty 0.15

## 2020-09-21 MED ORDER — INSULIN ASPART 100 UNIT/ML ~~LOC~~ SOLN: 5.0000 [IU] | Freq: Three times a day (TID) | SUBCUTANEOUS | Status: DC

## 2020-09-21 MED ORDER — SODIUM CHLORIDE 0.9 % IV SOLN: INTRAVENOUS | Status: DC | PRN

## 2020-09-21 MED ORDER — INSULIN ASPART 100 UNIT/ML ~~LOC~~ SOLN: 0.0000 [IU] | Freq: Three times a day (TID) | SUBCUTANEOUS | Status: DC

## 2020-09-21 NOTE — Progress Notes (Signed)
Inpatient Diabetes Program Recommendations  AACE/ADA: New Consensus Statement on Inpatient Glycemic Control (2015)  Target Ranges:  Prepandial:   less than 140 mg/dL      Peak postprandial:   less than 180 mg/dL (1-2 hours)      Critically ill patients:  140 - 180 mg/dL   Lab Results  Component Value Date   GLUCAP 367 (H) 09/21/2020   HGBA1C 8.3 (A) 06/20/2020    Review of Glycemic Control Results for Carlos Bowman, Carlos Bowman (MRN [id number]) as of 09/21/2020 11:53  Ref. Range 09/28/2020 21:42 09/21/2020 08:08  Glucose-Capillary Latest Ref Range: 70 - 99 mg/dL 372 (H) 367 (H)   Diabetes history: DM2 Outpatient Diabetes medications:  Metformin 1000 mg bid Current orders for Inpatient glycemic control:  Lantus 10 units daily, Novolog moderate tid with meals and HS Decadron 6 mg daily Inpatient Diabetes Program Recommendations:   May consider d/c of Lantus and add Levemir 15 units bid (0.3 units/kg).   Also consider adding Novolog 5 units tid with meals (hold if patient eats less than 50% or NPO).   Thanks,  Adah Perl, RN, BC-ADM Inpatient Diabetes Coordinator Pager [phone] (8a-5p)

## 2020-09-21 NOTE — Progress Notes (Addendum)
HD#1 Subjective:  Overnight Events: Mucinex given overnight  Patient states he continues to have shortness of breath, but it has improved since his admission. Denies chest pain at this time. Additionally denies abdominal pain. Elaborated on plan to continue treatment with remdesivir and steroids. Further discussed with patient that we do not believe this is caused by a interstitial lung disease flare.   Patient agrees with plan  Objective:  Vital signs in last 24 hours: Vitals:   09/21/20 0403 09/21/20 0404 09/21/20 0522 09/21/20 0547  BP: 137/74     Pulse: 97 96 96   Resp: (!) 27 (!) 23 (!) 30 (!) 22  Temp: 98.3 F (36.8 C)     TempSrc: Oral     SpO2: 90%  90%   Weight:      Height:       Supplemental O2: HFNC NRM SpO2: 90 % O2 Flow Rate (L/min): 15 L/min   Physical Exam:  Physical Exam Constitutional:      Appearance: He is ill-appearing.     Comments: Nasal cannula and non-rebreather in place.   Cardiovascular:     Rate and Rhythm: Normal rate and regular rhythm.     Pulses: Normal pulses.     Heart sounds: Normal heart sounds. No murmur heard.  No gallop.   Pulmonary:     Comments: Decreased breath sounds diffusely Abdominal:     General: Abdomen is flat.     Tenderness: There is no abdominal tenderness.  Neurological:     General: No focal deficit present.     Mental Status: He is alert and oriented to person, place, and time.  Psychiatric:        Mood and Affect: Mood normal.        Behavior: Behavior normal.    Filed Weights   10/01/2020 1040  Weight: 99.8 kg    Intake/Output Summary (Last 24 hours) at 09/21/2020 0619 Last data filed at 09/21/2020 0404 Gross per 24 hour  Intake 840 ml  Output 1050 ml  Net -210 ml   Net IO Since Admission: -210 mL [09/21/20 0619]  Pertinent Labs: CBC Latest Ref Rng & Units 10/08/2020 04/30/2019 04/29/2019  WBC 4.0 - 10.5 K/uL 4.4 9.2 11.0(H)  Hemoglobin 13.0 - 17.0 g/dL 14.7 12.6(L) 14.0  Hematocrit 39 - 52  % 48.1 39.0 43.7  Platelets 150 - 400 K/uL 194 312 282    CMP Latest Ref Rng & Units 10/07/2020 06/20/2020 05/07/2019  Glucose 70 - 99 mg/dL 388(H) 172(H) 83  BUN 8 - 23 mg/dL 15 11 10   Creatinine 0.61 - 1.24 mg/dL 1.29(H) 0.99 0.97  Sodium 135 - 145 mmol/L 128(L) 140 140  Potassium 3.5 - 5.1 mmol/L 4.4 4.5 5.3(H)  Chloride 98 - 111 mmol/L 93(L) 103 99  CO2 22 - 32 mmol/L 22 22 25   Calcium 8.9 - 10.3 mg/dL 9.2 9.9 10.0  Total Protein 6.5 - 8.1 g/dL 7.4 - -  Total Bilirubin 0.3 - 1.2 mg/dL 0.6 - -  Alkaline Phos 38 - 126 U/L 61 - -  AST 15 - 41 U/L 72(H) - -  ALT 0 - 44 U/L 37 - -    Imaging: DG Chest Portable 1 View  Result Date: 10/09/2020 CLINICAL DATA:  Fatigue, shortness of breath, and fever.  Hypoxia. EXAM: PORTABLE CHEST 1 VIEW COMPARISON:  Chest radiograph 04/26/2019 and chest CT 07/22/2020 FINDINGS: The cardiac silhouette is upper limits of normal in size. The lungs are mildly hypoinflated. Interstitial  densities are present in the lungs bilaterally, greatest in the bases. These have increased from the 2020 chest radiograph, most notably in the right upper lobe, however there is likely less of a change compared to the interval CT. No sizable pleural effusion or pneumothorax is identified. No acute osseous abnormality is seen. IMPRESSION: Chronic interstitial lung disease, possibly with superimposed atypical or viral infection. Electronically Signed   By: Logan Bores M.D.   On: 10/08/2020 11:19   Assessment/Plan:   Active Problems:   COVID-19   Pulmonary fibrosis ([place])   COVID-19 virus infection  Patient Summary: Carlos Bowman. is a 69 y.o. with a pertinent PMH of interstitial lung disease, hypertension, hyperlipidemia, type II DM, subclinical hypothyroidism, prior Hx of tobacco use who presented with cough, chills, fever, and on/off shortness of breath and admitted for COVID 19 Pneumonia requiring oxygen supplementation.   Acute Hypoxic Respiratory Distress Secondary to  COVID-19 PNA Hx of ILD Patient's breathing has improved since admission. Continues to be on supplemental oxygen therapy, 15L non-rebreather and nasal cannula, 92% O2 saturation. Because patient continued to be short of breath, switched to heated high flow 15 L. Switched solumedrol to decadron 6 mg daily. Order placed for monoclonal antibody therapy, however, notified by pharmacy that the hospital does not administer this treatment for patient's already admitted for [place]. Significant inflammatory markers; D-dimer 2.27, ferritin 7515.    - on day 2/10 of remdesivir (Day 1: 09/22/2020) - on day 2/10 of dexamethasone (Day 1: 09/16/2020) - currently on 15 L [place]; wean as able - IS, flutter valve  - antitussives - self prone when able  - trend inflammatory markers daily  History of Diabetes Mellitus  A1c pending. Home medications of metformin, sitagliptin. Patient with episode of hyperglycemia last night, 372. Treated with 10 units lantus and sliding scale insulin.   - Basal insulin> change to levemir 15 units BID - Add novolog 5 units TID with meals -SSI  History of Interstitial Lung disease Dr. Lynetta Mare consulted, does not believe patient is in acute flair of interstitial lung disease.  Diet: Heart Healthy IVF: None,None VTE: Enoxaparin Code: Full PT/OT recs: None, none.   Dispo: Anticipated discharge to Home in 3 days pending COVID treatment.   Sanjuana Letters DO Internal Medicine Resident PGY-1 Pager [phone] Please contact the on call pager after 5 pm and on weekends at [phone].

## 2020-09-21 NOTE — Plan of Care (Signed)
  Problem: Respiratory: Goal: Will maintain a patent airway Outcome: Progressing   Problem: Respiratory: Goal: Complications related to the disease process, condition or treatment will be avoided or minimized Outcome: Progressing   

## 2020-09-21 NOTE — Progress Notes (Signed)
RT NOTE: RT placed patient on [place] per MD order. Patient tolerating well at this time with sats of 93%. Pt in no distress at this time. Vitals are stable. RT will continue to monitor.

## 2020-09-22 ENCOUNTER — Inpatient Hospital Stay (HOSPITAL_COMMUNITY): Payer: Medicare HMO

## 2020-09-22 DIAGNOSIS — U071 COVID-19: Principal | ICD-10-CM

## 2020-09-22 LAB — COMPREHENSIVE METABOLIC PANEL WITH GFR
ALT: 34 U/L (ref 0–44)
AST: 111 U/L — ABNORMAL HIGH (ref 15–41)
Albumin: 3.4 g/dL — ABNORMAL LOW (ref 3.5–5.0)
Alkaline Phosphatase: 139 U/L — ABNORMAL HIGH (ref 38–126)
Anion gap: 13 (ref 5–15)
BUN: 29 mg/dL — ABNORMAL HIGH (ref 8–23)
CO2: 20 mmol/L — ABNORMAL LOW (ref 22–32)
Calcium: 8.9 mg/dL (ref 8.9–10.3)
Chloride: 99 mmol/L (ref 98–111)
Creatinine, Ser: 1.25 mg/dL — ABNORMAL HIGH (ref 0.61–1.24)
GFR, Estimated: 58 mL/min — ABNORMAL LOW
Glucose, Bld: 362 mg/dL — ABNORMAL HIGH (ref 70–99)
Potassium: 4.8 mmol/L (ref 3.5–5.1)
Sodium: 132 mmol/L — ABNORMAL LOW (ref 135–145)
Total Bilirubin: 0.6 mg/dL (ref 0.3–1.2)
Total Protein: 7 g/dL (ref 6.5–8.1)

## 2020-09-22 LAB — BLOOD CULTURE ID PANEL (REFLEXED) - BCID2

## 2020-09-22 LAB — POCT I-STAT 7, (LYTES, BLD GAS, ICA,H+H)
Acid-base deficit: 2 mmol/L (ref 0.0–2.0)
Bicarbonate: 27.2 mmol/L (ref 20.0–28.0)
Calcium, Ion: 1.25 mmol/L (ref 1.15–1.40)
HCT: 48 % (ref 39.0–52.0)
Hemoglobin: 16.3 g/dL (ref 13.0–17.0)
O2 Saturation: 99 %
Patient temperature: 97.7
Potassium: 5.3 mmol/L — ABNORMAL HIGH (ref 3.5–5.1)
Sodium: 131 mmol/L — ABNORMAL LOW (ref 135–145)
TCO2: 29 mmol/L (ref 22–32)
pCO2 arterial: 63.2 mmHg — ABNORMAL HIGH (ref 32.0–48.0)
pH, Arterial: 7.24 — ABNORMAL LOW (ref 7.350–7.450)
pO2, Arterial: 142 mmHg — ABNORMAL HIGH (ref 83.0–108.0)

## 2020-09-22 LAB — CBC WITH DIFFERENTIAL/PLATELET
Abs Immature Granulocytes: 0.04 10*3/uL (ref 0.00–0.07)
Basophils Absolute: 0 10*3/uL (ref 0.0–0.1)
Basophils Relative: 0 %
Eosinophils Absolute: 0 10*3/uL (ref 0.0–0.5)
Eosinophils Relative: 0 %
HCT: 48.8 % (ref 39.0–52.0)
Hemoglobin: 15.3 g/dL (ref 13.0–17.0)
Immature Granulocytes: 1 %
Lymphocytes Relative: 12 %
Lymphs Abs: 0.9 10*3/uL (ref 0.7–4.0)
MCH: 24.9 pg — ABNORMAL LOW (ref 26.0–34.0)
MCHC: 31.4 g/dL (ref 30.0–36.0)
MCV: 79.5 fL — ABNORMAL LOW (ref 80.0–100.0)
Monocytes Absolute: 0.4 10*3/uL (ref 0.1–1.0)
Monocytes Relative: 5 %
Neutro Abs: 6.1 10*3/uL (ref 1.7–7.7)
Neutrophils Relative %: 82 %
Platelets: 195 10*3/uL (ref 150–400)
RBC: 6.14 MIL/uL — ABNORMAL HIGH (ref 4.22–5.81)
RDW: 15 % (ref 11.5–15.5)
WBC: 7.4 10*3/uL (ref 4.0–10.5)
nRBC: 0 % (ref 0.0–0.2)

## 2020-09-22 LAB — TRIGLYCERIDES: Triglycerides: 403 mg/dL — ABNORMAL HIGH (ref <150)

## 2020-09-22 LAB — BASIC METABOLIC PANEL
Anion gap: 11 (ref 5–15)
BUN: 38 mg/dL — ABNORMAL HIGH (ref 8–23)
CO2: 25 mmol/L (ref 22–32)
Calcium: 8.9 mg/dL (ref 8.9–10.3)
Chloride: 96 mmol/L — ABNORMAL LOW (ref 98–111)
Creatinine, Ser: 1.44 mg/dL — ABNORMAL HIGH (ref 0.61–1.24)
GFR, Estimated: 49 mL/min — ABNORMAL LOW (ref >60)
Glucose, Bld: 448 mg/dL — ABNORMAL HIGH (ref 70–99)
Potassium: 5.8 mmol/L — ABNORMAL HIGH (ref 3.5–5.1)
Sodium: 132 mmol/L — ABNORMAL LOW (ref 135–145)

## 2020-09-22 LAB — BLOOD GAS, ARTERIAL
Acid-base deficit: 6.2 mmol/L — ABNORMAL HIGH (ref 0.0–2.0)
Bicarbonate: 17.8 mmol/L — ABNORMAL LOW (ref 20.0–28.0)
Drawn by: 51185
FIO2: 100
O2 Saturation: 59.2 %
Patient temperature: 36.9
pCO2 arterial: 29.8 mmHg — ABNORMAL LOW (ref 32.0–48.0)
pH, Arterial: 7.394 (ref 7.350–7.450)
pO2, Arterial: 33.4 mmHg — CL (ref 83.0–108.0)

## 2020-09-22 LAB — GLUCOSE, CAPILLARY
Glucose-Capillary: 398 mg/dL — ABNORMAL HIGH (ref 70–99)
Glucose-Capillary: 440 mg/dL — ABNORMAL HIGH (ref 70–99)
Glucose-Capillary: 432 mg/dL — ABNORMAL HIGH (ref 70–99)
Glucose-Capillary: 452 mg/dL — ABNORMAL HIGH (ref 70–99)
Glucose-Capillary: 393 mg/dL — ABNORMAL HIGH (ref 70–99)
Glucose-Capillary: 446 mg/dL — ABNORMAL HIGH (ref 70–99)
Glucose-Capillary: 357 mg/dL — ABNORMAL HIGH (ref 70–99)
Glucose-Capillary: 271 mg/dL — ABNORMAL HIGH (ref 70–99)

## 2020-09-22 LAB — HEMOGLOBIN A1C
Hgb A1c MFr Bld: 8.5 % — ABNORMAL HIGH (ref 4.8–5.6)
Mean Plasma Glucose: 197 mg/dL

## 2020-09-22 LAB — D-DIMER, QUANTITATIVE: D-Dimer, Quant: 2.1 ug{FEU}/mL — ABNORMAL HIGH (ref 0.00–0.50)

## 2020-09-22 LAB — PHOSPHORUS: Phosphorus: 3.4 mg/dL (ref 2.5–4.6)

## 2020-09-22 LAB — C-REACTIVE PROTEIN: CRP: 7.2 mg/dL — ABNORMAL HIGH

## 2020-09-22 LAB — MAGNESIUM: Magnesium: 2.2 mg/dL (ref 1.7–2.4)

## 2020-09-22 LAB — FERRITIN: Ferritin: >7500 ng/mL — ABNORMAL HIGH (ref 24–336)

## 2020-09-22 MED ORDER — LACTATED RINGERS IV SOLN: INTRAVENOUS | Status: DC

## 2020-09-22 MED ORDER — PHENYLEPHRINE 40 MCG/ML (10ML) SYRINGE FOR IV PUSH (FOR BLOOD PRESSURE SUPPORT)
PREFILLED_SYRINGE | INTRAVENOUS | Status: AC
  Filled 2020-09-22: qty 10

## 2020-09-22 MED ORDER — PANTOPRAZOLE SODIUM 40 MG IV SOLR
40.0000 mg | Freq: Every day | INTRAVENOUS | Status: DC
  Administered 2020-09-22 – 2020-09-26 (×5): 40 mg via INTRAVENOUS
  Filled 2020-09-22 (×5): qty 40

## 2020-09-22 MED ORDER — ETOMIDATE 2 MG/ML IV SOLN
20.0000 mg | Freq: Once | INTRAVENOUS | Status: AC
  Administered 2020-09-22: 20 mg via INTRAVENOUS

## 2020-09-22 MED ORDER — INSULIN ASPART 100 UNIT/ML ~~LOC~~ SOLN: 0.0000 [IU] | SUBCUTANEOUS | Status: DC

## 2020-09-22 MED ORDER — CHLORHEXIDINE GLUCONATE 0.12% ORAL RINSE (MEDLINE KIT)
15.0000 mL | Freq: Two times a day (BID) | OROMUCOSAL | Status: DC
  Administered 2020-09-22 – 2020-09-26 (×8): 15 mL via OROMUCOSAL

## 2020-09-22 MED ORDER — INSULIN REGULAR(HUMAN) IN NACL 100-0.9 UT/100ML-% IV SOLN
INTRAVENOUS | Status: DC
  Administered 2020-09-22: 17 [IU]/h via INTRAVENOUS
  Administered 2020-09-22: 19 [IU]/h via INTRAVENOUS
  Filled 2020-09-22 (×2): qty 100

## 2020-09-22 MED ORDER — ROCURONIUM BROMIDE 10 MG/ML (PF) SYRINGE
100.0000 mg | PREFILLED_SYRINGE | Freq: Once | INTRAVENOUS | Status: AC
  Administered 2020-09-22: 100 mg via INTRAVENOUS

## 2020-09-22 MED ORDER — DOCUSATE SODIUM 50 MG/5ML PO LIQD
100.0000 mg | Freq: Two times a day (BID) | ORAL | Status: DC
  Administered 2020-09-22: 100 mg via ORAL
  Filled 2020-09-22: qty 10

## 2020-09-22 MED ORDER — FENTANYL BOLUS VIA INFUSION
25.0000 ug | INTRAVENOUS | Status: DC | PRN
  Administered 2020-09-22: 100 ug via INTRAVENOUS
  Administered 2020-09-25 – 2020-09-26 (×2): 25 ug via INTRAVENOUS
  Filled 2020-09-22: qty 25

## 2020-09-22 MED ORDER — INSULIN ASPART 100 UNIT/ML ~~LOC~~ SOLN: 2.0000 [IU] | Freq: Three times a day (TID) | SUBCUTANEOUS | Status: DC

## 2020-09-22 MED ORDER — INSULIN ASPART 100 UNIT/ML ~~LOC~~ SOLN
6.0000 [IU] | Freq: Three times a day (TID) | SUBCUTANEOUS | Status: DC
  Administered 2020-09-22: 6 [IU] via SUBCUTANEOUS

## 2020-09-22 MED ORDER — INSULIN GLARGINE 100 UNIT/ML ~~LOC~~ SOLN
30.0000 [IU] | Freq: Every day | SUBCUTANEOUS | Status: DC
  Filled 2020-09-22: qty 0.3

## 2020-09-22 MED ORDER — FENTANYL CITRATE (PF) 100 MCG/2ML IJ SOLN
INTRAMUSCULAR | Status: AC
  Filled 2020-09-22: qty 2

## 2020-09-22 MED ORDER — ORAL CARE MOUTH RINSE
15.0000 mL | OROMUCOSAL | Status: DC
  Administered 2020-09-22 – 2020-09-26 (×38): 15 mL via OROMUCOSAL

## 2020-09-22 MED ORDER — POLYETHYLENE GLYCOL 3350 17 G PO PACK: 17.0000 g | PACK | Freq: Every day | ORAL | Status: DC

## 2020-09-22 MED ORDER — MIDAZOLAM HCL 2 MG/2ML IJ SOLN: 1.0000 mg | INTRAMUSCULAR | Status: DC | PRN

## 2020-09-22 MED ORDER — CHLORHEXIDINE GLUCONATE CLOTH 2 % EX PADS
6.0000 | MEDICATED_PAD | Freq: Every day | CUTANEOUS | Status: DC
  Administered 2020-09-22 – 2020-09-25 (×4): 6 via TOPICAL

## 2020-09-22 MED ORDER — DEXTROSE 50 % IV SOLN: 0.0000 mL | INTRAVENOUS | Status: DC | PRN

## 2020-09-22 MED ORDER — FUROSEMIDE 10 MG/ML IJ SOLN
INTRAMUSCULAR | Status: AC
  Administered 2020-09-22: 40 mg
  Filled 2020-09-22: qty 4

## 2020-09-22 MED ORDER — PROPOFOL 1000 MG/100ML IV EMUL
INTRAVENOUS | Status: AC
  Filled 2020-09-22: qty 100

## 2020-09-22 MED ORDER — PHENOL 1.4 % MT LIQD: 1.0000 | OROMUCOSAL | Status: DC | PRN

## 2020-09-22 MED ORDER — PROPOFOL 1000 MG/100ML IV EMUL
5.0000 ug/kg/min | INTRAVENOUS | Status: DC
  Administered 2020-09-22: 40 ug/kg/min via INTRAVENOUS
  Administered 2020-09-22: 20 ug/kg/min via INTRAVENOUS
  Administered 2020-09-23: 40.08 ug/kg/min via INTRAVENOUS
  Administered 2020-09-23: 50 ug/kg/min via INTRAVENOUS
  Administered 2020-09-23: 20 ug/kg/min via INTRAVENOUS
  Administered 2020-09-23 (×2): 40 ug/kg/min via INTRAVENOUS
  Administered 2020-09-23 – 2020-09-24 (×2): 50 ug/kg/min via INTRAVENOUS
  Administered 2020-09-24 (×2): 40 ug/kg/min via INTRAVENOUS
  Filled 2020-09-22 (×10): qty 100

## 2020-09-22 MED ORDER — MENTHOL 3 MG MT LOZG
1.0000 | LOZENGE | OROMUCOSAL | Status: DC | PRN
  Administered 2020-09-22: 3 mg via ORAL
  Filled 2020-09-22: qty 9

## 2020-09-22 MED ORDER — FENTANYL CITRATE (PF) 100 MCG/2ML IJ SOLN
25.0000 ug | Freq: Once | INTRAMUSCULAR | Status: AC
  Administered 2020-09-22: 100 ug via INTRAVENOUS

## 2020-09-22 MED ORDER — BARICITINIB 2 MG PO TABS
4.0000 mg | ORAL_TABLET | Freq: Every day | ORAL | Status: DC
  Filled 2020-09-22: qty 2

## 2020-09-22 MED ORDER — ROCURONIUM BROMIDE 10 MG/ML (PF) SYRINGE
PREFILLED_SYRINGE | INTRAVENOUS | Status: AC
  Filled 2020-09-22: qty 10

## 2020-09-22 MED ORDER — LIP MEDEX EX OINT
TOPICAL_OINTMENT | CUTANEOUS | Status: DC | PRN
  Filled 2020-09-22: qty 7

## 2020-09-22 MED ORDER — PROSOURCE TF PO LIQD
45.0000 mL | Freq: Two times a day (BID) | ORAL | Status: DC
  Administered 2020-09-22: 45 mL
  Filled 2020-09-22 (×2): qty 45

## 2020-09-22 MED ORDER — MIDAZOLAM HCL 2 MG/2ML IJ SOLN
1.0000 mg | INTRAMUSCULAR | Status: DC | PRN
  Administered 2020-09-22: 2 mg via INTRAVENOUS
  Administered 2020-09-26: 1 mg via INTRAVENOUS

## 2020-09-22 MED ORDER — VITAL HIGH PROTEIN PO LIQD
1000.0000 mL | ORAL | Status: DC
  Administered 2020-09-22: 1000 mL

## 2020-09-22 MED ORDER — FUROSEMIDE 10 MG/ML IJ SOLN: 40.0000 mg | Freq: Once | INTRAMUSCULAR | Status: AC

## 2020-09-22 MED ORDER — FENTANYL 2500MCG IN NS 250ML (10MCG/ML) PREMIX INFUSION
25.0000 ug/h | INTRAVENOUS | Status: DC
  Administered 2020-09-22: 50 ug/h via INTRAVENOUS
  Administered 2020-09-23 – 2020-09-26 (×6): 200 ug/h via INTRAVENOUS
  Filled 2020-09-22 (×9): qty 250

## 2020-09-22 MED ORDER — MIDAZOLAM HCL 2 MG/2ML IJ SOLN
INTRAMUSCULAR | Status: AC
  Filled 2020-09-22: qty 2

## 2020-09-22 NOTE — Procedures (Signed)
Central Venous Catheter Insertion Procedure Note  Jaivian Battaglini  [id number]  1951-05-10  Date:09/22/20  Time:6:22 PM   Provider Performing:Vanetta Rule   Procedure: Insertion of Non-tunneled Central Venous Catheter(36556) with US guidance (07680)   Indication(s) Medication administration and Difficult access  Consent Unable to obtain consent due to emergent nature of procedure.  Anesthesia Topical only with 1% lidocaine   Timeout Verified patient identification, verified procedure, site/side was marked, verified correct patient position, special equipment/implants available, medications/allergies/relevant history reviewed, required imaging and test results available.  Sterile Technique Maximal sterile technique including full sterile barrier drape, hand hygiene, sterile gown, sterile gloves, mask, hair covering, sterile ultrasound probe cover (if used).  Procedure Description Area of catheter insertion was cleaned with chlorhexidine and draped in sterile fashion.  With real-time ultrasound guidance a central venous catheter was placed into the left internal jugular vein. Nonpulsatile blood flow and easy flushing noted in all ports.  The catheter was sutured in place and sterile dressing applied.  Complications/Tolerance None; patient tolerated the procedure well. Chest X-ray is ordered to verify placement for internal jugular or subclavian cannulation.   Chest x-ray is not ordered for femoral cannulation.  EBL Minimal  Specimen(s) None  Kipp Brood, MD [facility] ICU Physician [place]  Pager: [phone] Mobile: [phone] After hours: [phone].  09/22/2020, 6:23 PM

## 2020-09-22 NOTE — Progress Notes (Signed)
Patient placed in prone position. RT X2, RNx3. ETT tube secured with cloth tape and foam pads placed on face. No complications noted.

## 2020-09-22 NOTE — Consult Note (Signed)
NAME:  Carlos Peretz., MRN:  [id number], DOB:  10-20-1951, LOS: 2 ADMISSION DATE:  10/05/2020, CONSULTATION DATE:  09/13/2020 REFERRING MD:  Heber Plover- IMTS, CHIEF COMPLAINT:  Dyspnea  Brief History   69 year old man who presented with sudden onset cough shortness of breath with fever and chills over the last few days.  Known ILD and prior smoking.  Not vaccinated against Covid.  In the ED initial saturation 63% started on high flow nasal cannula and nonrebreather with improvement of saturations into the 90s.  Admitted to IMTS.   Past Medical History  ILD, former smoker, DM, HLD, HTN, subclinical hyperthyroidism  Significant Hospital Events   10/12 admit 10/14 tx to ICU   Consults:  PCCM 10/12  Procedures:   Significant Diagnostic Tests:   Micro Data:  10/12 SARS 2 >> positive  Flu >> neg 10/12 BCx 2 >> 1/4 GPC, BCID did not identify organism, likely contaminant   Antimicrobials:  10/12 azithro  10/12 ceftriaxone    10/12 remdesivir >> 10/12 baricitinib >>  Interim history/subjective:  Afebrile  normotensive Worsening hypoxia despite HHFNC at 60L/ 1.0 and NRB with sats ~70's and increased work of breathing Has been getting out of bed, pulling oxygen off with desaturations into the 30's ABG 7.394/ 29.8/ 33.4/ 17.8  Objective   Blood pressure 135/70, pulse (!) 125, temperature 98.4 F (36.9 C), resp. rate (!) 45, height 5\' 11"  (1.803 m), weight 99.8 kg, SpO2 (!) 69 %.    FiO2 (%):  [100 %] 100 %   Intake/Output Summary (Last 24 hours) at 09/22/2020 1538 Last data filed at 09/22/2020 1400 Gross per 24 hour  Intake --  Output 525 ml  Net -525 ml   Filed Weights   09/21/2020 1040  Weight: 99.8 kg   Examination: General:  Ill appearing older male sitting upright in bed in moderate distress Neuro: alert, oriented, appropriate, MAE  CV: ST, no murmur PULM:  Accessory muscle use, tachypnea, clear except rales in right base GI: soft, bs + Extremities: warm/dry,  no LE edema  Skin: no rashes  Resolved Hospital Problem list    Assessment & Plan:   Acute hypoxic respiratory failure secondary to COVID-19 pneumonia on background history of ILD. Abrupt onset with fever most consistent with COVID-19.  Unlikely ILD flare. P:  Transfer to ICU High intubation risk.  Will trial BiPAP first  Maintain O2 saturations for goal SpO2 > 85% S/p lasix 40 mg Self proning as tolerated- has not been compliant with this Trend inflammatory markers Ongoing aggressive pulmonary hygiene, IS/ flutter valve  Continue decadron 6mg  daily for 10 days Baricitinib/ remdesivir per pharmacy  Prognosis guarded    DM, exacerbated by steroids  P:  SSI resistant, lantus   HTN  P:  Continue norvasc,   Best practice:  Diet:  NPO Pain/Anxiety/Delirium protocol (if indicated): n/a VAP protocol (if indicated): n/a DVT prophylaxis: lovenox GI prophylaxis: n/a Glucose control: SSI/ levemir  Mobility: BR for now Code Status: Full - verified with patient Family Communication: wife, Estill Bamberg updated by phone Disposition: tx to ICU   Labs   CBC: Recent Labs  Lab 09/18/2020 1045 09/21/20 0517 09/22/20 0355  WBC 4.4 5.4 7.4  NEUTROABS 3.4 4.4 6.1  HGB 14.7 14.8 15.3  HCT 48.1 47.9 48.8  MCV 81.4 80.8 79.5*  PLT 194 201 983    Basic Metabolic Panel: Recent Labs  Lab 10/03/2020 1045 09/21/20 0517 09/22/20 0355  NA 128* 132* 132*  K 4.4 4.7 4.8  CL 93* 96* 99  CO2 22 22 20*  GLUCOSE 388* 351* 362*  BUN 15 18 29*  CREATININE 1.29* 1.20 1.25*  CALCIUM 9.2 9.0 8.9  MG  --  1.9 2.2  PHOS  --  3.1 3.4   GFR: Estimated Creatinine Clearance: 65.2 mL/min (A) (by C-G formula based on SCr of 1.25 mg/dL (H)). Recent Labs  Lab 10/04/2020 1045 10/02/2020 1149 09/09/2020 1240 10/05/2020 1635 09/21/20 0517 09/22/20 0355  PROCALCITON  --   --   --  0.41  --   --   WBC 4.4  --   --   --  5.4 7.4  LATICACIDVEN  --  2.2* 2.5*  --   --   --     Liver Function  Tests: Recent Labs  Lab 09/30/2020 1045 09/21/20 0517 09/22/20 0355  AST 72* 107* 111*  ALT 37 39 34  ALKPHOS 61 76 139*  BILITOT 0.6 0.3 0.6  PROT 7.4 7.1 7.0  ALBUMIN 3.9 3.6 3.4*   No results for input(s): LIPASE, AMYLASE in the last 168 hours. No results for input(s): AMMONIA in the last 168 hours.  ABG    Component Value Date/Time   PHART 7.394 09/22/2020 1455   PCO2ART 29.8 (L) 09/22/2020 1455   PO2ART 33.4 (LL) 09/22/2020 1455   HCO3 17.8 (L) 09/22/2020 1455   ACIDBASEDEF 6.2 (H) 09/22/2020 1455   O2SAT 59.2 09/22/2020 1455     Coagulation Profile: Recent Labs  Lab 10/05/2020 1045  INR 1.1    Cardiac Enzymes: Recent Labs  Lab 09/30/2020 1658  CKTOTAL 188    HbA1C: Hemoglobin A1C  Date/Time Value Ref Range Status  06/20/2020 08:58 AM 8.3 (A) 4.0 - 5.6 % Final  03/14/2020 08:36 AM 7.4 (A) 4.0 - 5.6 % Final   Hgb A1c MFr Bld  Date/Time Value Ref Range Status  09/21/2020 01:36 PM 8.5 (H) 4.8 - 5.6 % Final    Comment:    (NOTE)         Prediabetes: 5.7 - 6.4         Diabetes: >6.4         Glycemic control for adults with diabetes: <7.0   04/28/2019 06:00 AM 10.3 (H) 4.8 - 5.6 % Final    Comment:    (NOTE) Pre diabetes:          5.7%-6.4% Diabetes:              >6.4% Glycemic control for   <7.0% adults with diabetes     CBG: Recent Labs  Lab 09/21/20 1644 09/21/20 2001 09/22/20 0805 09/22/20 1151 09/22/20 1353  GLUCAP 344* 396* 398* 440* 432*    Critical care time: 35 mins      Kennieth Rad, ACNP [facility] 09/22/2020, 3:38 PM  See Amion for personal pager PCCM on call pager [phone]

## 2020-09-22 NOTE — Progress Notes (Signed)
Epic chatted at 1422 by RN, stated patient's oxygen saturations in 70%'s with maximum oxygen supplementation on heated high flow nasal cannula. Nurse called rapid at this time. Dr. Gilford Rile and I went to bedside, patient laying in bed with heated high flow nasal cannula and non-rebreather in place. Rapid response nurse at bedside and informed us patient was pulling off non-rebreather and assisted patient in putting it back on as his oxygen saturation was 60-70%. After leaving the room and returning, she noticed the patient's oxygen saturation dropped to 30%'s and he was attempting to get out of bed.  On examination patient in respiratory distress with accessory muscle use. Oxygen saturation in upper 70's with heated high flow 100% and non-rebreather. Patient with decreased breath sounds bilaterally. X-ray chest was performed, appeared to have blunting of right sided costophrenic angles. Compared to prior x-ray, bilateral opacities seem to have worsened.   Lasix 40 mg IV ordered and consulted PCCM due to patient's worsening acute hypoxic respiratory failure and history of interstitial lung disease.   Carlos Ermel Verne DO [phone]

## 2020-09-22 NOTE — Progress Notes (Addendum)
Patient found at 1343 with low 02 Sats in 70/s trying to stand up with 02 mask off. Rapid called. Dr.Holfman notified and in to see. CXR and blood gasses completed. Patient transferred to 77M bed 1. Family  notified of transfer. Step son,Family is picking up pt.s belongings at 9 today,

## 2020-09-22 NOTE — Progress Notes (Signed)
RT NOTE: RT transported patient on NRB/[place] with rapid response RN Helle to 22M. RT's on unit at bedside placing patient on bipap through [facility]. RT to continue to monitor.

## 2020-09-22 NOTE — Progress Notes (Signed)
RN educated pt on the importance of proning with diagnosis, regarding adequate oxygenation. Pt able to prone and remained in prone position for about 5 minutes before stating he was unable to continue due to discomfort. O2 sats were 92-93% while proning. Pt is now resting in high flowers, O2 sats at 86%-90%. RN will continue to encourage pt to prone.

## 2020-09-22 NOTE — Progress Notes (Signed)
PHARMACY - PHYSICIAN COMMUNICATION CRITICAL VALUE ALERT - BLOOD CULTURE IDENTIFICATION (BCID)  Carlos Bowman. is an 69 y.o. male who presented to [facility] on 09/30/2020 with a chief complaint of cough, chills, fever, shortness of breath, COVID-19 diagnosis  Assessment:  Blood cultures with 1/4 bottles growing gram positive cocci. BCID did not identify any organism. Likely a contaminant.   Name of physician (or Provider) Contacted: Parks Neptune, PharmD  Current antibiotics: on remdesivir, no other abx  Changes to prescribed antibiotics recommended:  Patient is on recommended antibiotics - No changes needed  Results for orders placed or performed during the hospital encounter of 09/25/2020  Blood Culture ID Panel (Reflexed) (Collected: 10/06/2020 11:15 AM)  Result Value Ref Range   Enterococcus faecalis NOT DETECTED NOT DETECTED   Enterococcus Faecium NOT DETECTED NOT DETECTED   Listeria monocytogenes NOT DETECTED NOT DETECTED   Staphylococcus species NOT DETECTED NOT DETECTED   Staphylococcus aureus (BCID) NOT DETECTED NOT DETECTED   Staphylococcus epidermidis NOT DETECTED NOT DETECTED   Staphylococcus lugdunensis NOT DETECTED NOT DETECTED   Streptococcus species NOT DETECTED NOT DETECTED   Streptococcus agalactiae NOT DETECTED NOT DETECTED   Streptococcus pneumoniae NOT DETECTED NOT DETECTED   Streptococcus pyogenes NOT DETECTED NOT DETECTED   A.calcoaceticus-baumannii NOT DETECTED NOT DETECTED   Bacteroides fragilis NOT DETECTED NOT DETECTED   Enterobacterales NOT DETECTED NOT DETECTED   Enterobacter cloacae complex NOT DETECTED NOT DETECTED   Escherichia coli NOT DETECTED NOT DETECTED   Klebsiella aerogenes NOT DETECTED NOT DETECTED   Klebsiella oxytoca NOT DETECTED NOT DETECTED   Klebsiella pneumoniae NOT DETECTED NOT DETECTED   Proteus species NOT DETECTED NOT DETECTED   Salmonella species NOT DETECTED NOT DETECTED   Serratia marcescens NOT DETECTED NOT DETECTED    Haemophilus influenzae NOT DETECTED NOT DETECTED   Neisseria meningitidis NOT DETECTED NOT DETECTED   Pseudomonas aeruginosa NOT DETECTED NOT DETECTED   Stenotrophomonas maltophilia NOT DETECTED NOT DETECTED   Candida albicans NOT DETECTED NOT DETECTED   Candida auris NOT DETECTED NOT DETECTED   Candida glabrata NOT DETECTED NOT DETECTED   Candida krusei NOT DETECTED NOT DETECTED   Candida parapsilosis NOT DETECTED NOT DETECTED   Candida tropicalis NOT DETECTED NOT DETECTED   Cryptococcus neoformans/gattii NOT DETECTED NOT DETECTED   Dimple Nanas, PharmD PGY-1 Acute Care Pharmacy Resident Office: [phone] 09/22/2020 3:09 PM

## 2020-09-22 NOTE — Procedures (Signed)
Intubation Procedure Note  Carlos Bowman  [id number]  [birth year]  Date:09/22/20  Time:6:20 PM   Provider Performing:Bridget Westbrooks    Procedure: Intubation (31500)  Indication(s) Respiratory Failure  Consent Risks of the procedure as well as the alternatives and risks of each were explained to the patient and/or caregiver.  Consent for the procedure was obtained and is signed in the bedside chart   Anesthesia Etomidate, Versed, Fentanyl and Rocuronium   Time Out Verified patient identification, verified procedure, site/side was marked, verified correct patient position, special equipment/implants available, medications/allergies/relevant history reviewed, required imaging and test results available.   Sterile Technique Usual hand hygeine, masks, and gloves were used   Procedure Description Patient positioned in bed supine.  Sedation given as noted above.  Patient was intubated with endotracheal tube using Glidescope.  View was Grade 2 only posterior commissure .  Number of attempts was 1.  Colorimetric CO2 detector was consistent with tracheal placement.   Complications/Tolerance None; patient tolerated the procedure well. Chest X-ray is ordered to verify placement.  Kipp Brood, MD [facility] ICU Physician [place]  Pager: [phone] Mobile: [phone] After hours: [phone].  09/22/2020, 6:22 PM

## 2020-09-22 NOTE — Progress Notes (Signed)
Pt was placed on BIPAP via the [facility] I per MD for low saturations. Pt is saturating 85-87% on 16/10, R16, 100%. RT will monitor.

## 2020-09-22 NOTE — Progress Notes (Signed)
Attempted to call wife, Estill Bamberg at [phone] to update her on patient's deterioration requiring intubation.  No other number or contact listed.    No answer.  No message left on automated answering machine.       Kennieth Rad, ACNP [facility] 09/22/2020, 6:21 PM

## 2020-09-22 NOTE — Progress Notes (Signed)
HD#2 Subjective:  Overnight Events: Hyperglycemic, ranging from 340's-390's. Patient attempting to lay prone, but uncomfortable. Endorsed dry throat, PRN lozanges/throat spray written.   Patient sitting up right in bed, states he was attempting to lay prone but having a difficult time due to being to attached to many cables. He continues to be short of breath. He demonstrated using the incentive spirometer, discussed with patient need to suck in and exhale slowly. He was able to reach 250-300 mL. Patient asked how long he will be in the hospital and it was explained that he continues to require high flow oxygen and we would like to see a decrease in oxygen supplementation dependence.   Discussed with nursing who state they will help adjust the patient and get him into the prone positioning.   Objective:  Vital signs in last 24 hours: Vitals:   09/22/20 0250 09/22/20 0337 09/22/20 0338 09/22/20 0429  BP:   113/72   Pulse: 87 88    Resp: (!) 28  (!) 34   Temp:  98.4 F (36.9 C)    TempSrc:  Oral    SpO2: 92% 90%  93%  Weight:      Height:       Supplemental O2: Heated High Flow Nasal Cannula, Non Rebreather SpO2: 93 % (pt is proning) O2 Flow Rate (L/min): 45 L/min (titrated) FiO2 (%): 100 %   Physical Exam:  Physical Exam Vitals and nursing note reviewed.  Constitutional:      Appearance: He is ill-appearing.  HENT:     Nose:     Comments: Nasal cannula in place    Mouth/Throat:     Comments: Non-rebreather off face upon examination, patient put back on during pulmonary examination Cardiovascular:     Rate and Rhythm: Normal rate and regular rhythm.  Pulmonary:     Effort: Respiratory distress present.     Comments: Decreased breath sounds diffusely Neurological:     General: No focal deficit present.     Mental Status: He is alert and oriented to person, place, and time.  Psychiatric:        Mood and Affect: Mood normal.        Behavior: Behavior normal.      Filed Weights   09/09/2020 1040  Weight: 99.8 kg    Intake/Output Summary (Last 24 hours) at 09/22/2020 0545 Last data filed at 09/21/2020 2022 Gross per 24 hour  Intake --  Output 375 ml  Net -375 ml   Net IO Since Admission: -585 mL [09/22/20 0545]  Pertinent Labs: CBC Latest Ref Rng & Units 09/22/2020 09/21/2020 09/29/2020  WBC 4.0 - 10.5 K/uL 7.4 5.4 4.4  Hemoglobin 13.0 - 17.0 g/dL 15.3 14.8 14.7  Hematocrit 39 - 52 % 48.8 47.9 48.1  Platelets 150 - 400 K/uL 195 201 194    CMP Latest Ref Rng & Units 09/22/2020 09/21/2020 09/19/2020  Glucose 70 - 99 mg/dL 362(H) 351(H) 388(H)  BUN 8 - 23 mg/dL 29(H) 18 15  Creatinine 0.61 - 1.24 mg/dL 1.25(H) 1.20 1.29(H)  Sodium 135 - 145 mmol/L 132(L) 132(L) 128(L)  Potassium 3.5 - 5.1 mmol/L 4.8 4.7 4.4  Chloride 98 - 111 mmol/L 99 96(L) 93(L)  CO2 22 - 32 mmol/L 20(L) 22 22  Calcium 8.9 - 10.3 mg/dL 8.9 9.0 9.2  Total Protein 6.5 - 8.1 g/dL 7.0 7.1 7.4  Total Bilirubin 0.3 - 1.2 mg/dL 0.6 0.3 0.6  Alkaline Phos 38 - 126 U/L 139(H) 76 61  AST 15 - 41 U/L 111(H) 107(H) 72(H)  ALT 0 - 44 U/L 34 39 37    Imaging: No results found.  Assessment/Plan:   Active Problems:   COVID-19   Pulmonary fibrosis ([place])   COVID-19 virus infection   Patient Summary: Carlos Bowman. is a 69 y.o. with a pertinent PMH of interstitial lung disease, hypertension, hyperlipidemia, type II DM, subclinical hypothyroidism, prior Hx of tobacco use who presented with cough, chills, fever, and on/off shortness of breath and admitted for COVID 19 Pneumonia requiring oxygen supplementation and management of diabetes.   Acute Hypoxic Respiratory Distress Secondary to COVID-19 PNA Hx of ILD Patient continues to require heated high flow 45L. When prone, his oxygen saturation was 93%+, however, patient states this is uncomfortable and was unable to stay prone for extended time. Decreased breath sounds diffusely, suspect patient unable to take deep  breaths (250-300 mL when observing patient use incentive spirometer). Significant inflammatory markers; D-dimer 2.10, ferritin >7500 , CRP 7.2 from 10.5 AST of 111 ALT of 34. Will start baricitinib and continue to trend liver enzymes, if ast/alt continue to increase, will discontinue baricitinib.   - on day 3/10 of remdesivir (Day 1: 09/13/2020) - on day 3/10 of dexamethasone (Day 1: 10/09/2020) - on day 1/10 of baricitinib (Day 1: 09/22/20) - currently on heated high flow nasal cannula and non rebreather - IS, flutter valve  - antitussives - self prone when able  - trend inflammatory markers daily  History of Diabetes Mellitus  A1c 8.5. Home medications of metformin, sitagliptin. Patient continues to be hyperglycemic, ranging from 340's-390's despite receiving lantus and meal time insulin. Nursing staff informed team that patient is eating full trays food, as such will add 6 units of novolog to meal time. Suspect steroid treatment for COVID contributing to patient's persistent hyperglycemia.   - Basal insulin> change to 30 lantus - Add novolog 6 units TID with meals. - SSI  History of Interstitial Lung disease Dr. Lynetta Mare consulted, does not believe patient is in acute flair of interstitial lung disease.  Diet: Heart healthy/Carb IVF: None,None VTE: Enoxaparin Code: Full PT/OT recs: None, none.  Dispo: Anticipated discharge to Home in 4-5 days pending COVID treatment and improvement of hypoxia.   Sanjuana Letters DO Internal Medicine Resident PGY-1 Pager [phone] Please contact the on call pager after 5 pm and on weekends at [phone].

## 2020-09-22 NOTE — Progress Notes (Signed)
RT note. Pt. Head turned from Lt to Rt without any complications, able to pass cath.

## 2020-09-22 NOTE — Significant Event (Signed)
Rapid Response Event Note   Reason for Call :  Low O2 sats Patient had gotten out of bed without oxygen O2 sats still about 78%  Initial Focused Assessment:  Patient is sitting in bed, with increased WOB BP 115/67  ST 115  RR 45  O2 sat 78% on heated high flow 50L/100% and NRB with max flow Lung sounds with crackles through out Heart tones regular Finger tips are purple  Interventions:  Attempted to prone, pt unable.  Placed high on his left side  O2 sats 81%.  After a couple of minutes he is sitting up on the side of the bed with his NRB off O2 sats dropped into the 30s.  He became more labored and tachypenic.  And HR 130s with ventricular ectopy.  After 10 min he is still labored  RR 50  O2 sat 69-74% HR 125.  MD notified of change in patient status. PCXR done IMTS residents at bedside  (consulting CCM) 1440:  BP 135/70  HR 125  RR 45  O2 sat 69-76 40mg  lasix given IV ABG done  Transferred to [place] of Care:  BIpap   Event Summary:   MD Notified: Carolynne Edouard DO Call Time: [place] Time: [place] Time: [place]  Raliegh Ip, RN

## 2020-09-22 NOTE — Progress Notes (Signed)
RTnote. Pt. Head turned from RT to LT without any complications. Able to pass cath

## 2020-09-23 LAB — POCT I-STAT 7, (LYTES, BLD GAS, ICA,H+H)
Acid-base deficit: 3 mmol/L — ABNORMAL HIGH (ref 0.0–2.0)
Acid-base deficit: 2 mmol/L (ref 0.0–2.0)
Acid-base deficit: 4 mmol/L — ABNORMAL HIGH (ref 0.0–2.0)
Bicarbonate: 26.5 mmol/L (ref 20.0–28.0)
Bicarbonate: 26.2 mmol/L (ref 20.0–28.0)
Bicarbonate: 24.9 mmol/L (ref 20.0–28.0)
Calcium, Ion: 1.21 mmol/L (ref 1.15–1.40)
Calcium, Ion: 1.2 mmol/L (ref 1.15–1.40)
Calcium, Ion: 1.18 mmol/L (ref 1.15–1.40)
HCT: 47 % (ref 39.0–52.0)
HCT: 45 % (ref 39.0–52.0)
HCT: 44 % (ref 39.0–52.0)
Hemoglobin: 16 g/dL (ref 13.0–17.0)
Hemoglobin: 15.3 g/dL (ref 13.0–17.0)
Hemoglobin: 15 g/dL (ref 13.0–17.0)
O2 Saturation: 91 %
O2 Saturation: 92 %
O2 Saturation: 88 %
Patient temperature: 96.2
Patient temperature: 97.8
Patient temperature: 96.4
Potassium: 4.9 mmol/L (ref 3.5–5.1)
Potassium: 4.9 mmol/L (ref 3.5–5.1)
Potassium: 5.1 mmol/L (ref 3.5–5.1)
Sodium: 135 mmol/L (ref 135–145)
Sodium: 136 mmol/L (ref 135–145)
Sodium: 135 mmol/L (ref 135–145)
TCO2: 28 mmol/L (ref 22–32)
TCO2: 28 mmol/L (ref 22–32)
TCO2: 27 mmol/L (ref 22–32)
pCO2 arterial: 59.1 mmHg — ABNORMAL HIGH (ref 32.0–48.0)
pCO2 arterial: 58.5 mmHg — ABNORMAL HIGH (ref 32.0–48.0)
pCO2 arterial: 56 mmHg — ABNORMAL HIGH (ref 32.0–48.0)
pH, Arterial: 7.252 — ABNORMAL LOW (ref 7.350–7.450)
pH, Arterial: 7.257 — ABNORMAL LOW (ref 7.350–7.450)
pH, Arterial: 7.25 — ABNORMAL LOW (ref 7.350–7.450)
pO2, Arterial: 69 mmHg — ABNORMAL LOW (ref 83.0–108.0)
pO2, Arterial: 74 mmHg — ABNORMAL LOW (ref 83.0–108.0)
pO2, Arterial: 61 mmHg — ABNORMAL LOW (ref 83.0–108.0)

## 2020-09-23 LAB — CBC WITH DIFFERENTIAL/PLATELET
Abs Immature Granulocytes: 0.04 10*3/uL (ref 0.00–0.07)
Basophils Absolute: 0 10*3/uL (ref 0.0–0.1)
Basophils Relative: 0 %
Eosinophils Absolute: 0 10*3/uL (ref 0.0–0.5)
Eosinophils Relative: 0 %
HCT: 48.4 % (ref 39.0–52.0)
Hemoglobin: 14.7 g/dL (ref 13.0–17.0)
Immature Granulocytes: 1 %
Lymphocytes Relative: 15 %
Lymphs Abs: 1.3 10*3/uL (ref 0.7–4.0)
MCH: 25 pg — ABNORMAL LOW (ref 26.0–34.0)
MCHC: 30.4 g/dL (ref 30.0–36.0)
MCV: 82.3 fL (ref 80.0–100.0)
Monocytes Absolute: 0.7 10*3/uL (ref 0.1–1.0)
Monocytes Relative: 8 %
Neutro Abs: 6.3 10*3/uL (ref 1.7–7.7)
Neutrophils Relative %: 76 %
Platelets: 223 10*3/uL (ref 150–400)
RBC: 5.88 MIL/uL — ABNORMAL HIGH (ref 4.22–5.81)
RDW: 15.3 % (ref 11.5–15.5)
WBC: 8.2 10*3/uL (ref 4.0–10.5)
nRBC: 0 % (ref 0.0–0.2)

## 2020-09-23 LAB — CULTURE, BLOOD (ROUTINE X 2): Special Requests: ADEQUATE

## 2020-09-23 LAB — COMPREHENSIVE METABOLIC PANEL
ALT: 27 U/L (ref 0–44)
AST: 86 U/L — ABNORMAL HIGH (ref 15–41)
Albumin: 3.3 g/dL — ABNORMAL LOW (ref 3.5–5.0)
Alkaline Phosphatase: 186 U/L — ABNORMAL HIGH (ref 38–126)
Anion gap: 13 (ref 5–15)
BUN: 48 mg/dL — ABNORMAL HIGH (ref 8–23)
CO2: 23 mmol/L (ref 22–32)
Calcium: 9.3 mg/dL (ref 8.9–10.3)
Chloride: 99 mmol/L (ref 98–111)
Creatinine, Ser: 2.77 mg/dL — ABNORMAL HIGH (ref 0.61–1.24)
GFR, Estimated: 22 mL/min — ABNORMAL LOW (ref >60)
Glucose, Bld: 127 mg/dL — ABNORMAL HIGH (ref 70–99)
Potassium: 5.2 mmol/L — ABNORMAL HIGH (ref 3.5–5.1)
Sodium: 135 mmol/L (ref 135–145)
Total Bilirubin: 0.7 mg/dL (ref 0.3–1.2)
Total Protein: 6.5 g/dL (ref 6.5–8.1)

## 2020-09-23 LAB — GLUCOSE, CAPILLARY
Glucose-Capillary: 120 mg/dL — ABNORMAL HIGH (ref 70–99)
Glucose-Capillary: 122 mg/dL — ABNORMAL HIGH (ref 70–99)
Glucose-Capillary: 133 mg/dL — ABNORMAL HIGH (ref 70–99)
Glucose-Capillary: 134 mg/dL — ABNORMAL HIGH (ref 70–99)
Glucose-Capillary: 139 mg/dL — ABNORMAL HIGH (ref 70–99)
Glucose-Capillary: 141 mg/dL — ABNORMAL HIGH (ref 70–99)
Glucose-Capillary: 141 mg/dL — ABNORMAL HIGH (ref 70–99)
Glucose-Capillary: 153 mg/dL — ABNORMAL HIGH (ref 70–99)
Glucose-Capillary: 154 mg/dL — ABNORMAL HIGH (ref 70–99)
Glucose-Capillary: 165 mg/dL — ABNORMAL HIGH (ref 70–99)
Glucose-Capillary: 169 mg/dL — ABNORMAL HIGH (ref 70–99)
Glucose-Capillary: 179 mg/dL — ABNORMAL HIGH (ref 70–99)
Glucose-Capillary: 196 mg/dL — ABNORMAL HIGH (ref 70–99)
Glucose-Capillary: 207 mg/dL — ABNORMAL HIGH (ref 70–99)
Glucose-Capillary: 344 mg/dL — ABNORMAL HIGH (ref 70–99)
Glucose-Capillary: 432 mg/dL — ABNORMAL HIGH (ref 70–99)

## 2020-09-23 LAB — PHOSPHORUS: Phosphorus: 6 mg/dL — ABNORMAL HIGH (ref 2.5–4.6)

## 2020-09-23 LAB — C-REACTIVE PROTEIN: CRP: 10 mg/dL — ABNORMAL HIGH (ref <1.0)

## 2020-09-23 LAB — TRIGLYCERIDES: Triglycerides: 442 mg/dL — ABNORMAL HIGH (ref <150)

## 2020-09-23 LAB — MAGNESIUM: Magnesium: 3.2 mg/dL — ABNORMAL HIGH (ref 1.7–2.4)

## 2020-09-23 LAB — FERRITIN: Ferritin: >7500 ng/mL — ABNORMAL HIGH (ref 24–336)

## 2020-09-23 MED ORDER — NOREPINEPHRINE 4 MG/250ML-% IV SOLN
0.0000 ug/min | INTRAVENOUS | Status: DC
  Administered 2020-09-23 (×2): 11 ug/min via INTRAVENOUS
  Administered 2020-09-23: 2 ug/min via INTRAVENOUS
  Administered 2020-09-24: 9 ug/min via INTRAVENOUS
  Administered 2020-09-26: 4 ug/min via INTRAVENOUS
  Filled 2020-09-23 (×6): qty 250

## 2020-09-23 MED ORDER — DEXAMETHASONE 6 MG PO TABS
6.0000 mg | ORAL_TABLET | Freq: Every day | ORAL | Status: DC
  Administered 2020-09-23 – 2020-09-26 (×4): 6 mg
  Filled 2020-09-23 (×4): qty 1

## 2020-09-23 MED ORDER — POLYETHYLENE GLYCOL 3350 17 G PO PACK
17.0000 g | PACK | Freq: Every day | ORAL | Status: DC
  Administered 2020-09-23 – 2020-09-26 (×4): 17 g
  Filled 2020-09-23 (×4): qty 1

## 2020-09-23 MED ORDER — PROSOURCE TF PO LIQD
90.0000 mL | Freq: Four times a day (QID) | ORAL | Status: DC
  Administered 2020-09-23 – 2020-09-26 (×13): 90 mL
  Filled 2020-09-23 (×12): qty 90

## 2020-09-23 MED ORDER — DEXTROSE 10 % IV SOLN: INTRAVENOUS | Status: DC | PRN

## 2020-09-23 MED ORDER — SODIUM CHLORIDE 0.9 % IV BOLUS
1000.0000 mL | Freq: Once | INTRAVENOUS | Status: AC
  Administered 2020-09-23: 1000 mL via INTRAVENOUS

## 2020-09-23 MED ORDER — INSULIN DETEMIR 100 UNIT/ML ~~LOC~~ SOLN
24.0000 [IU] | Freq: Two times a day (BID) | SUBCUTANEOUS | Status: DC
  Administered 2020-09-23 – 2020-09-24 (×3): 24 [IU] via SUBCUTANEOUS
  Filled 2020-09-23 (×5): qty 0.24

## 2020-09-23 MED ORDER — INSULIN ASPART 100 UNIT/ML ~~LOC~~ SOLN
8.0000 [IU] | SUBCUTANEOUS | Status: DC
  Administered 2020-09-23 – 2020-09-26 (×18): 8 [IU] via SUBCUTANEOUS

## 2020-09-23 MED ORDER — VITAL AF 1.2 CAL PO LIQD
1000.0000 mL | ORAL | Status: DC
  Administered 2020-09-23 – 2020-09-25 (×3): 1000 mL
  Filled 2020-09-23 (×3): qty 1000

## 2020-09-23 MED ORDER — INSULIN DETEMIR 100 UNIT/ML ~~LOC~~ SOLN
12.0000 [IU] | Freq: Two times a day (BID) | SUBCUTANEOUS | Status: DC
  Filled 2020-09-23 (×2): qty 0.12

## 2020-09-23 MED ORDER — VITAL HIGH PROTEIN PO LIQD
1000.0000 mL | ORAL | Status: AC
  Administered 2020-09-23: 1000 mL

## 2020-09-23 MED ORDER — DOCUSATE SODIUM 50 MG/5ML PO LIQD
100.0000 mg | Freq: Two times a day (BID) | ORAL | Status: DC
  Administered 2020-09-23 – 2020-09-26 (×7): 100 mg
  Filled 2020-09-23 (×7): qty 10

## 2020-09-23 MED ORDER — ASPIRIN 81 MG PO CHEW
81.0000 mg | CHEWABLE_TABLET | Freq: Every day | ORAL | Status: DC
  Administered 2020-09-23 – 2020-09-26 (×4): 81 mg
  Filled 2020-09-23 (×4): qty 1

## 2020-09-23 MED ORDER — INSULIN ASPART 100 UNIT/ML ~~LOC~~ SOLN: 3.0000 [IU] | SUBCUTANEOUS | Status: DC

## 2020-09-23 MED ORDER — INSULIN ASPART 100 UNIT/ML ~~LOC~~ SOLN: 1.0000 [IU] | SUBCUTANEOUS | Status: DC

## 2020-09-23 MED ORDER — VITAL HIGH PROTEIN PO LIQD: 1000.0000 mL | ORAL | Status: DC

## 2020-09-23 MED ORDER — VITAL AF 1.2 CAL PO LIQD: 1000.0000 mL | ORAL | Status: DC

## 2020-09-23 MED ORDER — BARICITINIB 1 MG PO TABS
1.0000 mg | ORAL_TABLET | Freq: Every day | ORAL | Status: DC
  Administered 2020-09-23 – 2020-09-25 (×3): 1 mg via ORAL
  Filled 2020-09-23 (×3): qty 1

## 2020-09-23 MED ORDER — INSULIN ASPART 100 UNIT/ML ~~LOC~~ SOLN
0.0000 [IU] | SUBCUTANEOUS | Status: DC
  Administered 2020-09-23: 4 [IU] via SUBCUTANEOUS
  Administered 2020-09-23: 15 [IU] via SUBCUTANEOUS
  Administered 2020-09-23: 11 [IU] via SUBCUTANEOUS
  Administered 2020-09-23: 20 [IU] via SUBCUTANEOUS
  Administered 2020-09-24 (×2): 11 [IU] via SUBCUTANEOUS
  Administered 2020-09-24: 15 [IU] via SUBCUTANEOUS
  Administered 2020-09-24 – 2020-09-25 (×3): 11 [IU] via SUBCUTANEOUS
  Administered 2020-09-25: 4 [IU] via SUBCUTANEOUS
  Administered 2020-09-25: 7 [IU] via SUBCUTANEOUS
  Administered 2020-09-25: 15 [IU] via SUBCUTANEOUS
  Administered 2020-09-25 (×2): 7 [IU] via SUBCUTANEOUS
  Administered 2020-09-25: 11 [IU] via SUBCUTANEOUS
  Administered 2020-09-26: 4 [IU] via SUBCUTANEOUS
  Administered 2020-09-26 (×2): 3 [IU] via SUBCUTANEOUS

## 2020-09-23 NOTE — Progress Notes (Addendum)
NAME:  Carlos Umholtz., MRN:  [id number], DOB:  1951-07-27, LOS: 3 ADMISSION DATE:  09/25/2020, CONSULTATION DATE:  09/19/2020 REFERRING MD:  Heber Keaau- IMTS, CHIEF COMPLAINT:  Dyspnea  Brief History   69 year old man who presented with sudden onset cough shortness of breath with fever and chills over the last few days.  Known ILD and prior smoking.  Not vaccinated against Covid.  In the ED initial saturation 63% started on high flow nasal cannula and nonrebreather with improvement of saturations into the 90s.  Admitted to IMTS.   Past Medical History  ILD, former smoker, DM, HLD, HTN, subclinical hyperthyroidism  Significant Hospital Events   10/12 admit 10/14 tx to ICU   Consults:  PCCM 10/12  Procedures:   Significant Diagnostic Tests:   Micro Data:  10/12 SARS 2 >> positive  Flu >> neg 10/12 BCx 2 >> 1/4 GPC, BCID did not identify organism, likely contaminant   Antimicrobials:  10/12 azithro  10/12 ceftriaxone    10/12 remdesivir >> 10/12 baricitinib >>  Interim history/subjective:  Worsening hypoxia yesterday with increased confusion and desaturations into the low 80s. Intubated and placed prone.  Acceptable oxygenation at the expense of high airway pressures.  Objective   Blood pressure 108/68, pulse (!) 56, temperature (!) 96.2 F (35.7 C), temperature source Rectal, resp. rate (!) 35, height 5\' 11"  (1.803 m), weight 94.2 kg, SpO2 (!) 89 %.    Vent Mode: PCV FiO2 (%):  [50 %-100 %] 50 % Set Rate:  [16 bmp-35 bmp] 35 bmp Vt Set:  [450 mL] 450 mL PEEP:  [10 cmH20-18 cmH20] 13 cmH20 Plateau Pressure:  [37 cmH20-38 cmH20] 37 cmH20   Intake/Output Summary (Last 24 hours) at 09/23/2020 1109 Last data filed at 09/23/2020 1059 Gross per 24 hour  Intake 1403.36 ml  Output 275 ml  Net 1128.36 ml   Filed Weights   09/17/2020 1040 09/23/20 0344  Weight: 99.8 kg 94.2 kg   Examination: General: Intubated, sedated in prone position. Neuro: Sedated, no response to  pain HEENT: OG tube, NG tube in place with no skin breakdown.  Minimal facial edema. CV: Heart sounds are unremarkable extremities are warm and well-perfused. PULM: No accessory muscle use bronchial breath sounds posteriorly only.  High airway pressures. GI: soft, bs + Extremities: warm/dry, no LE edema  Skin: no rashes  Resolved Hospital Problem list    Assessment & Plan:   Critically ill due to acute hypoxic respiratory failure secondary to COVID-19 pneumonia on background history of ILD.  Requiring mechanical ventilation. High airway pressures.  Unable to reduce tidal volume further given borderline hypercarbia. Abrupt onset with fever most consistent with COVID-19.  Unlikely ILD flare. -Increase respiratory rate -Attempt optimize gas exchange prone before resuming supine.  COVID-19 pneumonia -Continue decadron 6mg  daily for 10 days -Baricitinib/ remdesivir per pharmacy  -Prognosis guarded given age and underlying lung disease.  Uncontrolled type 2 diabetes DM, exacerbated by steroids  -On insulin infusion  HTN at baseline -Holding home antihypertensive medication  Acute kidney injury with oliguria. Even fluid balance since admission, may be volume contracted due insensitive losses -Trial of gentle hydration  Best practice:  Diet:  NPO -tube feed initiated Pain/Anxiety/Delirium protocol (if indicated): Fentanyl and propofol infusion to target RASS -3 to -4 VAP protocol (if indicated): Bundle in place DVT prophylaxis: lovenox GI prophylaxis: Pantoprazole Glucose control: Insulin infusion Mobility: BR  Code Status: Full - verified with patient Family Communication: wife, Estill Bamberg updated by phone 10/14 Disposition:  ICU  Labs   CBC: Recent Labs  Lab 09/24/2020 1045 09/13/2020 1045 09/21/20 0517 09/21/20 0517 09/22/20 0355 09/22/20 2011 09/23/20 0500 09/23/20 0952 09/23/20 1043  WBC 4.4  --  5.4  --  7.4  --  8.2  --   --   NEUTROABS 3.4  --  4.4  --  6.1  --   6.3  --   --   HGB 14.7   < > 14.8   < > 15.3 16.3 14.7 16.0 15.3  HCT 48.1   < > 47.9   < > 48.8 48.0 48.4 47.0 45.0  MCV 81.4  --  80.8  --  79.5*  --  82.3  --   --   PLT 194  --  201  --  195  --  223  --   --    < > = values in this interval not displayed.    Basic Metabolic Panel: Recent Labs  Lab 09/27/2020 1045 09/15/2020 1045 09/21/20 0517 09/21/20 0517 09/22/20 0355 09/22/20 0355 09/22/20 1752 09/22/20 2011 09/23/20 0408 09/23/20 0952 09/23/20 1043  NA 128*   < > 132*   < > 132*   < > 132* 131* 135 135 136  K 4.4   < > 4.7   < > 4.8   < > 5.8* 5.3* 5.2* 4.9 4.9  CL 93*  --  96*  --  99  --  96*  --  99  --   --   CO2 22  --  22  --  20*  --  25  --  23  --   --   GLUCOSE 388*  --  351*  --  362*  --  448*  --  127*  --   --   BUN 15  --  18  --  29*  --  38*  --  48*  --   --   CREATININE 1.29*  --  1.20  --  1.25*  --  1.44*  --  2.77*  --   --   CALCIUM 9.2  --  9.0  --  8.9  --  8.9  --  9.3  --   --   MG  --   --  1.9  --  2.2  --   --   --  3.2*  --   --   PHOS  --   --  3.1  --  3.4  --   --   --  6.0*  --   --    < > = values in this interval not displayed.   GFR: Estimated Creatinine Clearance: 28.7 mL/min (A) (by C-G formula based on SCr of 2.77 mg/dL (H)). Recent Labs  Lab 09/24/2020 1045 09/21/2020 1149 10/05/2020 1240 09/18/2020 1635 09/21/20 0517 09/22/20 0355 09/23/20 0500  PROCALCITON  --   --   --  0.41  --   --   --   WBC 4.4  --   --   --  5.4 7.4 8.2  LATICACIDVEN  --  2.2* 2.5*  --   --   --   --     Liver Function Tests: Recent Labs  Lab 10/02/2020 1045 09/21/20 0517 09/22/20 0355 09/23/20 0408  AST 72* 107* 111* 86*  ALT 37 39 34 27  ALKPHOS 61 76 139* 186*  BILITOT 0.6 0.3 0.6 0.7  PROT 7.4 7.1 7.0 6.5  ALBUMIN 3.9 3.6 3.4* 3.3*   No  results for input(s): LIPASE, AMYLASE in the last 168 hours. No results for input(s): AMMONIA in the last 168 hours.  ABG    Component Value Date/Time   PHART 7.257 (L) 09/23/2020 1043   PCO2ART  58.5 (H) 09/23/2020 1043   PO2ART 74 (L) 09/23/2020 1043   HCO3 26.2 09/23/2020 1043   TCO2 28 09/23/2020 1043   ACIDBASEDEF 2.0 09/23/2020 1043   O2SAT 92.0 09/23/2020 1043     Coagulation Profile: Recent Labs  Lab 09/25/2020 1045  INR 1.1    Cardiac Enzymes: Recent Labs  Lab 10/03/2020 1658  CKTOTAL 188    HbA1C: Hemoglobin A1C  Date/Time Value Ref Range Status  06/20/2020 08:58 AM 8.3 (A) 4.0 - 5.6 % Final  03/14/2020 08:36 AM 7.4 (A) 4.0 - 5.6 % Final   Hgb A1c MFr Bld  Date/Time Value Ref Range Status  09/21/2020 01:36 PM 8.5 (H) 4.8 - 5.6 % Final    Comment:    (NOTE)         Prediabetes: 5.7 - 6.4         Diabetes: >6.4         Glycemic control for adults with diabetes: <7.0   04/28/2019 06:00 AM 10.3 (H) 4.8 - 5.6 % Final    Comment:    (NOTE) Pre diabetes:          5.7%-6.4% Diabetes:              >6.4% Glycemic control for   <7.0% adults with diabetes     CBG: Recent Labs  Lab 09/23/20 0631 09/23/20 0727 09/23/20 0832 09/23/20 0939 09/23/20 1025  GLUCAP 120* 133* 179* 153* 154*    CRITICAL CARE Performed by: Kipp Brood   Total critical care time: 40 minutes  Critical care time was exclusive of separately billable procedures and treating other patients.  Critical care was necessary to treat or prevent imminent or life-threatening deterioration.  Critical care was time spent personally by me on the following activities: development of treatment plan with patient and/or surrogate as well as nursing, discussions with consultants, evaluation of patient's response to treatment, examination of patient, obtaining history from patient or surrogate, ordering and performing treatments and interventions, ordering and review of laboratory studies, ordering and review of radiographic studies, pulse oximetry, re-evaluation of patient's condition and participation in multidisciplinary rounds.  Kipp Brood, MD [facility] ICU Physician [place]  Pager: [phone] Mobile: [phone] After hours: [phone].

## 2020-09-23 NOTE — Progress Notes (Signed)
Assisted tele visit to patient with wife.  Maryelizabeth Rowan, RN

## 2020-09-23 NOTE — Therapy (Signed)
Patient proned at this time. RT and Rx4 assist. No complications at this time.

## 2020-09-23 NOTE — Progress Notes (Signed)
[facility] Progress Note Patient Name: Carlos Bowman. DOB: [birth year] MRN: [id number]   Date of Service  09/23/2020  HPI/Events of Note  Patient meets criteria for transition off endotool.  eICU Interventions  Insulin transition orders entered.        Kerry Kass Jenella Craigie 09/23/2020, 5:30 AM

## 2020-09-23 NOTE — Progress Notes (Signed)
[facility] Progress Note Patient Name: Alexsander Cavins. DOB: [birth year] MRN: [id number]   Date of Service  09/23/2020  HPI/Events of Note  Hypotension while intubated, sedated and on the ventilator.  eICU Interventions  Norepinephrine ordered to keep MAP at 65 mmHg or greater.        Kerry Kass Shaquille Janes 09/23/2020, 12:31 AM

## 2020-09-23 NOTE — Progress Notes (Signed)
Patient placed back in supine position. RT and Rx4 assist. No skin breakdown noted. No complications at this time.

## 2020-09-23 NOTE — Progress Notes (Signed)
RT note. Pt. Head turned RT to LT without any complications, able to pass cath

## 2020-09-23 NOTE — Progress Notes (Signed)
Initial Nutrition Assessment  DOCUMENTATION CODES:   Not applicable  INTERVENTION:   Transition TF via OG tube: Vital AF 1.2 @ 35 ml/hr (840 ml/day) 90 ml ProSource TF QID  Provides: 1328 kcal, 151 grams protein, and 702 ml free water.  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  GOAL:   Patient will meet greater than or equal to 90% of their needs  MONITOR:   TF tolerance, Labs  REASON FOR ASSESSMENT:   Consult, Ventilator Enteral/tube feeding initiation and management  ASSESSMENT:   Non-vaccinated pt with PMH of ILD, former smoker, DM, HLD, HTN, and subclinical hyperthyroidism admitted 10/12 with COVID-19 PNA.   10/12 60L O2 via HFNC and NRB 10/14 intubated due to worsening hypoxia and increased WOB  Today's weight is significanly decreased from usual. Pt with underlying chronic illness but malnutrition could be severe in acute setting from [place] but unable to determine accurate weight hx at this time.    Patient is currently intubated on ventilator support MV: 13.1 L/min Temp (24hrs), Avg:97.4 F (36.3 C), Min:96.2 F (35.7 C), Max:98.4 F (36.9 C)  Propofol: 24 ml/hr provides: 633 kcal  Medications reviewed and include: decadron, colace, SSI, 12 units levemir BID, miralax Fentanyl  Levophed @ 1 mcg  Remdesivir  Labs reviewed: Ferritin: 7500, TG: 442, K+ 5.2, PO4: 6 CBG's: [phone]   Current TF: Vital High Protein @ 40 ml/hr with 45 ml ProSource BID Provides: 1040 kcal and 106 grams protein  NUTRITION - FOCUSED PHYSICAL EXAM:    Most Recent Value  Orbital Region Moderate depletion  Upper Arm Region Moderate depletion  Thoracic and Lumbar Region No depletion  Buccal Region Moderate depletion  Temple Region Moderate depletion  Clavicle Bone Region Moderate depletion   Clavicle and Acromion Bone Region Moderate depletion  Scapular Bone Region Moderate depletion  Dorsal Hand Moderate depletion  Patellar Region Unable to assess  Anterior Thigh Region Unable to assess  Posterior Calf Region Mild depletion  Edema (RD Assessment) None  Hair Reviewed  Eyes Unable to assess  Mouth Unable to assess  Skin Reviewed  Nails Reviewed       Diet Order:   Diet Order            Diet NPO time specified  Diet effective now                 EDUCATION NEEDS:   No education needs have been identified at this time  Skin:  Skin Assessment: Reviewed RN Assessment  Last BM:  unknown  Height:   Ht Readings from Last 1 Encounters:  09/22/20 5\' 11"  (1.803 m)    Weight:   Wt Readings from Last 1 Encounters:  09/23/20 94.2 kg    Ideal Body Weight:  78.1 kg  BMI:  Body mass index is 28.96 kg/m.  Estimated Nutritional Needs:   Kcal:  1800-2400  Protein:  130-150 grams  Fluid:  >1.8 L/day  Lockie Pares., RD, LDN, CNSC See AMiON for contact information

## 2020-09-24 ENCOUNTER — Inpatient Hospital Stay (HOSPITAL_COMMUNITY): Payer: Medicare HMO

## 2020-09-24 DIAGNOSIS — J9601 Acute respiratory failure with hypoxia: Secondary | ICD-10-CM

## 2020-09-24 LAB — GLUCOSE, CAPILLARY
Glucose-Capillary: 247 mg/dL — ABNORMAL HIGH (ref 70–99)
Glucose-Capillary: 265 mg/dL — ABNORMAL HIGH (ref 70–99)
Glucose-Capillary: 266 mg/dL — ABNORMAL HIGH (ref 70–99)
Glucose-Capillary: 285 mg/dL — ABNORMAL HIGH (ref 70–99)
Glucose-Capillary: 291 mg/dL — ABNORMAL HIGH (ref 70–99)
Glucose-Capillary: 299 mg/dL — ABNORMAL HIGH (ref 70–99)
Glucose-Capillary: 319 mg/dL — ABNORMAL HIGH (ref 70–99)

## 2020-09-24 LAB — COMPREHENSIVE METABOLIC PANEL
ALT: 28 U/L (ref 0–44)
AST: 63 U/L — ABNORMAL HIGH (ref 15–41)
Albumin: 3.1 g/dL — ABNORMAL LOW (ref 3.5–5.0)
Alkaline Phosphatase: 182 U/L — ABNORMAL HIGH (ref 38–126)
Anion gap: 15 (ref 5–15)
BUN: 82 mg/dL — ABNORMAL HIGH (ref 8–23)
CO2: 20 mmol/L — ABNORMAL LOW (ref 22–32)
Calcium: 8.6 mg/dL — ABNORMAL LOW (ref 8.9–10.3)
Chloride: 101 mmol/L (ref 98–111)
Creatinine, Ser: 5.47 mg/dL — ABNORMAL HIGH (ref 0.61–1.24)
GFR, Estimated: 10 mL/min — ABNORMAL LOW (ref >60)
Glucose, Bld: 301 mg/dL — ABNORMAL HIGH (ref 70–99)
Potassium: 5.6 mmol/L — ABNORMAL HIGH (ref 3.5–5.1)
Sodium: 136 mmol/L (ref 135–145)
Total Bilirubin: 1 mg/dL (ref 0.3–1.2)
Total Protein: 5.9 g/dL — ABNORMAL LOW (ref 6.5–8.1)

## 2020-09-24 LAB — CBC WITH DIFFERENTIAL/PLATELET
Abs Immature Granulocytes: 0.09 10*3/uL — ABNORMAL HIGH (ref 0.00–0.07)
Basophils Absolute: 0 10*3/uL (ref 0.0–0.1)
Basophils Relative: 0 %
Eosinophils Absolute: 0 10*3/uL (ref 0.0–0.5)
Eosinophils Relative: 0 %
HCT: 43.3 % (ref 39.0–52.0)
Hemoglobin: 13.4 g/dL (ref 13.0–17.0)
Immature Granulocytes: 1 %
Lymphocytes Relative: 9 %
Lymphs Abs: 1 10*3/uL (ref 0.7–4.0)
MCH: 25.8 pg — ABNORMAL LOW (ref 26.0–34.0)
MCHC: 30.9 g/dL (ref 30.0–36.0)
MCV: 83.3 fL (ref 80.0–100.0)
Monocytes Absolute: 1.4 10*3/uL — ABNORMAL HIGH (ref 0.1–1.0)
Monocytes Relative: 12 %
Neutro Abs: 9.2 10*3/uL — ABNORMAL HIGH (ref 1.7–7.7)
Neutrophils Relative %: 78 %
Platelets: 287 10*3/uL (ref 150–400)
RBC: 5.2 MIL/uL (ref 4.22–5.81)
RDW: 15.7 % — ABNORMAL HIGH (ref 11.5–15.5)
WBC: 11.8 10*3/uL — ABNORMAL HIGH (ref 4.0–10.5)
nRBC: 0 % (ref 0.0–0.2)

## 2020-09-24 LAB — POCT I-STAT 7, (LYTES, BLD GAS, ICA,H+H)
Acid-base deficit: 8 mmol/L — ABNORMAL HIGH (ref 0.0–2.0)
Bicarbonate: 19 mmol/L — ABNORMAL LOW (ref 20.0–28.0)
Calcium, Ion: 1.11 mmol/L — ABNORMAL LOW (ref 1.15–1.40)
HCT: 38 % — ABNORMAL LOW (ref 39.0–52.0)
Hemoglobin: 12.9 g/dL — ABNORMAL LOW (ref 13.0–17.0)
O2 Saturation: 93 %
Potassium: 5.3 mmol/L — ABNORMAL HIGH (ref 3.5–5.1)
Sodium: 136 mmol/L (ref 135–145)
TCO2: 20 mmol/L — ABNORMAL LOW (ref 22–32)
pCO2 arterial: 45.6 mmHg (ref 32.0–48.0)
pH, Arterial: 7.228 — ABNORMAL LOW (ref 7.350–7.450)
pO2, Arterial: 80 mmHg — ABNORMAL LOW (ref 83.0–108.0)

## 2020-09-24 LAB — URINALYSIS, ROUTINE W REFLEX MICROSCOPIC
Bilirubin Urine: NEGATIVE
Glucose, UA: 50 mg/dL — AB
Hgb urine dipstick: NEGATIVE
Ketones, ur: NEGATIVE mg/dL
Leukocytes,Ua: NEGATIVE
Nitrite: NEGATIVE
Protein, ur: 100 mg/dL — AB
Specific Gravity, Urine: 1.028 (ref 1.005–1.030)
pH: 5 (ref 5.0–8.0)

## 2020-09-24 LAB — RENAL FUNCTION PANEL
Albumin: 2.7 g/dL — ABNORMAL LOW (ref 3.5–5.0)
Anion gap: 14 (ref 5–15)
BUN: 96 mg/dL — ABNORMAL HIGH (ref 8–23)
CO2: 21 mmol/L — ABNORMAL LOW (ref 22–32)
Calcium: 7.9 mg/dL — ABNORMAL LOW (ref 8.9–10.3)
Chloride: 105 mmol/L (ref 98–111)
Creatinine, Ser: 6.22 mg/dL — ABNORMAL HIGH (ref 0.61–1.24)
GFR, Estimated: 8 mL/min — ABNORMAL LOW (ref >60)
Glucose, Bld: 294 mg/dL — ABNORMAL HIGH (ref 70–99)
Phosphorus: 5.7 mg/dL — ABNORMAL HIGH (ref 2.5–4.6)
Potassium: 5.6 mmol/L — ABNORMAL HIGH (ref 3.5–5.1)
Sodium: 140 mmol/L (ref 135–145)

## 2020-09-24 LAB — MAGNESIUM: Magnesium: 2.9 mg/dL — ABNORMAL HIGH (ref 1.7–2.4)

## 2020-09-24 LAB — SODIUM, URINE, RANDOM: Sodium, Ur: 28 mmol/L

## 2020-09-24 LAB — D-DIMER, QUANTITATIVE: D-Dimer, Quant: 9.43 ug/mL-FEU — ABNORMAL HIGH (ref 0.00–0.50)

## 2020-09-24 LAB — FERRITIN: Ferritin: 5362 ng/mL — ABNORMAL HIGH (ref 24–336)

## 2020-09-24 LAB — PHOSPHORUS: Phosphorus: 5.2 mg/dL — ABNORMAL HIGH (ref 2.5–4.6)

## 2020-09-24 LAB — C-REACTIVE PROTEIN: CRP: 3.8 mg/dL — ABNORMAL HIGH (ref <1.0)

## 2020-09-24 LAB — MRSA PCR SCREENING: MRSA by PCR: NEGATIVE

## 2020-09-24 MED ORDER — HEPARIN SODIUM (PORCINE) 5000 UNIT/ML IJ SOLN
5000.0000 [IU] | Freq: Three times a day (TID) | INTRAMUSCULAR | Status: DC
  Administered 2020-09-24 – 2020-09-26 (×6): 5000 [IU] via SUBCUTANEOUS
  Filled 2020-09-24 (×6): qty 1

## 2020-09-24 MED ORDER — HEPARIN (PORCINE) 2000 UNITS/L FOR CRRT: INTRAVENOUS_CENTRAL | Status: DC | PRN

## 2020-09-24 MED ORDER — HEPARIN SODIUM (PORCINE) 1000 UNIT/ML DIALYSIS: 1000.0000 [IU] | INTRAMUSCULAR | Status: DC | PRN

## 2020-09-24 MED ORDER — MIDAZOLAM 50MG/50ML (1MG/ML) PREMIX INFUSION
0.5000 mg/h | INTRAVENOUS | Status: DC
  Administered 2020-09-24: 6 mg/h via INTRAVENOUS
  Administered 2020-09-24: 8 mg/h via INTRAVENOUS
  Administered 2020-09-24: 3 mg/h via INTRAVENOUS
  Administered 2020-09-25 (×2): 8 mg/h via INTRAVENOUS
  Administered 2020-09-25: 9 mg/h via INTRAVENOUS
  Administered 2020-09-26 (×2): 10 mg/h via INTRAVENOUS
  Filled 2020-09-24 (×4): qty 50
  Filled 2020-09-24: qty 100
  Filled 2020-09-24 (×2): qty 50

## 2020-09-24 MED ORDER — LINAGLIPTIN 5 MG PO TABS
5.0000 mg | ORAL_TABLET | Freq: Every day | ORAL | Status: DC
  Administered 2020-09-25 – 2020-09-26 (×2): 5 mg
  Filled 2020-09-24 (×2): qty 1

## 2020-09-24 MED ORDER — LINAGLIPTIN 5 MG PO TABS
5.0000 mg | ORAL_TABLET | Freq: Every day | ORAL | Status: DC
  Administered 2020-09-24: 5 mg via ORAL
  Filled 2020-09-24: qty 1

## 2020-09-24 MED ORDER — INSULIN DETEMIR 100 UNIT/ML ~~LOC~~ SOLN
30.0000 [IU] | Freq: Two times a day (BID) | SUBCUTANEOUS | Status: DC
  Administered 2020-09-24 – 2020-09-25 (×2): 30 [IU] via SUBCUTANEOUS
  Filled 2020-09-24 (×4): qty 0.3

## 2020-09-24 MED ORDER — SODIUM CHLORIDE 0.9 % IV BOLUS
1000.0000 mL | Freq: Once | INTRAVENOUS | Status: AC
  Administered 2020-09-24: 1000 mL via INTRAVENOUS

## 2020-09-24 MED ORDER — PRISMASOL BGK 4/2.5 32-4-2.5 MEQ/L IV SOLN
INTRAVENOUS | Status: DC
  Filled 2020-09-24 (×19): qty 5000

## 2020-09-24 MED ORDER — SODIUM CHLORIDE 0.9 % IV SOLN
250.0000 [IU]/h | INTRAVENOUS | Status: DC
  Administered 2020-09-25: 1850 [IU]/h via INTRAVENOUS_CENTRAL
  Administered 2020-09-25: 2050 [IU]/h via INTRAVENOUS_CENTRAL
  Administered 2020-09-25: 1250 [IU]/h via INTRAVENOUS_CENTRAL
  Administered 2020-09-25: 250 [IU]/h via INTRAVENOUS_CENTRAL
  Administered 2020-09-26: 2000 [IU]/h via INTRAVENOUS_CENTRAL
  Administered 2020-09-26: 1900 [IU]/h via INTRAVENOUS_CENTRAL
  Filled 2020-09-24 (×6): qty 2

## 2020-09-24 MED ORDER — HEPARIN BOLUS VIA INFUSION (CRRT)
1000.0000 [IU] | INTRAVENOUS | Status: DC | PRN
  Administered 2020-09-25 (×6): 1000 [IU] via INTRAVENOUS_CENTRAL
  Filled 2020-09-24: qty 1000

## 2020-09-24 MED ORDER — HEPARIN SODIUM (PORCINE) 1000 UNIT/ML DIALYSIS
1000.0000 [IU] | INTRAMUSCULAR | Status: DC | PRN
  Filled 2020-09-24 (×2): qty 6

## 2020-09-24 MED ORDER — PRISMASOL BGK 4/2.5 32-4-2.5 MEQ/L REPLACEMENT SOLN
Status: DC
  Filled 2020-09-24 (×3): qty 5000

## 2020-09-24 MED ORDER — PRISMASOL BGK 4/2.5 32-4-2.5 MEQ/L REPLACEMENT SOLN
Status: DC
  Filled 2020-09-24 (×7): qty 5000

## 2020-09-24 MED ORDER — HEPARIN SODIUM (PORCINE) 5000 UNIT/ML IJ SOLN
INTRAMUSCULAR | Status: AC
  Filled 2020-09-24: qty 3

## 2020-09-24 NOTE — Consult Note (Addendum)
Carlos Bowman. Admit Date: 09/13/2020 09/24/2020 Rexene Agent Requesting Physician:  Lynetta Mare MD  Reason for Consult:  AKI HPI:  43M PMH including hypertension, hyperlipidemia, DM 2, ILD, history of tobacco use, normal GFR who was admitted on 10/12 after presenting with dyspnea and eventually diagnosed with COVID-19 pneumonia.  Patient was placed on empiric antibiotics, and remdesivir, baricitinib, dexamethasone.  Unfortunately decompensated and transferred to ICU, intubated.  Presenting creatinine was 1.2 and baseline is around 1.0 as of 06/2020, see below.  Creatinine has worsened to 5.47 today.  No renal imaging.  Has a Foley catheter.  Urine analysis at presentation with 4+ protein, no hematuria, no pyuria.  Historically he has A2 microalbuminuria.  Home medications to include lisinopril which was held at admission.  K is 5.6 today.  Patient with 35 mL of urine output in the past 12 hours, now anuric.  3.5 L positive from presentation.  He is prone on 50% FiO2.  He is requiring norepinephrine to maintain blood pressure.    CK on 10/12 was 188.  No contrast exposure.   Creat (mg/dL)  Date Value  05/26/2015 0.91  07/08/2014 0.93  12/31/2013 0.89  11/23/2011 0.93   Creatinine, Ser (mg/dL)  Date Value  09/24/2020 5.47 (H)  09/23/2020 2.77 (H)  09/22/2020 1.44 (H)  09/22/2020 1.25 (H)  09/21/2020 1.20  10/01/2020 1.29 (H)  06/20/2020 0.99  05/07/2019 0.97  04/30/2019 0.88  04/29/2019 0.97  ] I/Os: I/O last 3 completed shifts: In: 4278.5 [I.V.:2557; NG/GT:819.8; IV Piggyback:901.8] Out: 0    ROS NSAIDS: No exposure IV Contrast no exposure  TMP/SMX no exposure Hypotension present currently Balance of 12 systems is negative w/ exceptions as above  PMH  Past Medical History:  Diagnosis Date  . Diabetes mellitus   . Hyperlipidemia   . Hypertension   . Subclinical hyperthyroidism   . Tobacco use    PSH  Past Surgical History:  Procedure Laterality Date  .  DENTAL SURGERY    . 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   FH  Family History  Problem Relation Age of Onset  . Colon cancer Father        Died at age 83 due to it.  Marland Kitchen Heart disease Mother   . Gout Mother   . Diabetes Sister   . Breast cancer Sister   . Lung cancer Paternal Uncle   . Cancer Paternal Aunt        Unknown cancer   SH  reports that he quit smoking about 3 years ago. His smoking use included cigarettes. He smoked 0.50 packs per day. He has never used smokeless tobacco. He reports that he does not drink alcohol and does not use drugs. Allergies No Known Allergies Home medications Prior to Admission medications   Medication Sig Start Date End Date Taking? Authorizing Provider  amLODipine (NORVASC) 10 MG tablet Take 1 tablet (10 mg total) by mouth daily. 03/14/20 03/14/21 Yes Axel Filler, MD  aspirin EC 81 MG tablet Take 1 tablet (81 mg total) by mouth daily. 05/26/15  Yes McLean-Scocuzza, Nino Glow, MD  cetirizine (ZYRTEC ALLERGY) 10 MG tablet Take 1 tablet (10 mg total) by mouth daily. 03/14/20 03/14/21 Yes Axel Filler, MD  Chlorphen-Phenyleph-ASA (ALKA-SELTZER PLUS COLD) 2-7.8-325 MG TBEF Take 2 tablets by mouth every 6 (six) hours as needed (cough).   Yes [provider]  Cholecalciferol (VITAMIN D3) 10 MCG (400 UNIT) CAPS Take 400 Units  by mouth daily.   Yes [provider]  lisinopril (ZESTRIL) 40 MG tablet Take 1 tablet (40 mg total) by mouth daily. 03/14/20  Yes Axel Filler, MD  metFORMIN (GLUCOPHAGE) 1000 MG tablet Take 1 tablet (1,000 mg total) by mouth 2 (two) times daily with a meal. 03/14/20 03/14/21 Yes Axel Filler, MD  Blood Glucose Monitoring Suppl [facility] VERIO) w/Device KIT Check blood sugar 1 time a day 03/24/19   Axel Filler, MD  OneTouch Delica Lancets 03T MISC Use to check blood sugar 1 time a day 03/24/19   Axel Filler, MD  [facility] VERIO test strip USE  STRIP  TO CHECK GLUCOSE ONCE DAILY 07/12/20   Axel Filler, MD  sitaGLIPtin (JANUVIA) 50 MG tablet Take 1 tablet (50 mg total) by mouth daily. Patient not taking: Reported on 09/10/2020 06/20/20   Axel Filler, MD    Current Medications Scheduled Meds: . amLODipine  10 mg Oral Daily  . aspirin  81 mg Per Tube Daily  . baricitinib  1 mg Oral Daily  . chlorhexidine gluconate (MEDLINE KIT)  15 mL Mouth Rinse BID  . Chlorhexidine Gluconate Cloth  6 each Topical Daily  . dexamethasone  6 mg Per Tube Daily  . docusate  100 mg Per Tube BID  . enoxaparin (LOVENOX) injection  40 mg Subcutaneous Q24H  . feeding supplement (PROSource TF)  90 mL Per Tube QID  . insulin aspart  0-20 Units Subcutaneous Q4H  . insulin aspart  8 Units Subcutaneous Q4H  . insulin detemir  24 Units Subcutaneous Q12H  . mouth rinse  15 mL Mouth Rinse 10 times per day  . pantoprazole (PROTONIX) IV  40 mg Intravenous Daily  . polyethylene glycol  17 g Per Tube Daily   Continuous Infusions: . dextrose    . feeding supplement (VITAL AF 1.2 CAL) 35 mL/hr at 09/24/20 0437  . fentaNYL infusion INTRAVENOUS 200 mcg/hr (09/24/20 0900)  . lactated ringers Stopped (09/24/20 0832)  . norepinephrine (LEVOPHED) Adult infusion 3 mcg/min (09/24/20 0900)  . propofol (DIPRIVAN) infusion 40 mcg/kg/min (09/24/20 0907)  . remdesivir 100 mg in NS 100 mL Stopped (09/23/20 1039)   PRN Meds:.acetaminophen, dextrose, fentaNYL, midazolam, midazolam, ondansetron **OR** ondansetron (ZOFRAN) IV, phenol, polyethylene glycol  CBC Recent Labs  Lab 09/22/20 0355 09/22/20 2011 09/23/20 0500 09/23/20 0952 09/23/20 1043 09/23/20 1338 09/24/20 0357  WBC 7.4  --  8.2  --   --   --  11.8*  NEUTROABS 6.1  --  6.3  --   --   --  9.2*  HGB 15.3   < > 14.7   < > 15.3 15.0 13.4  HCT 48.8   < > 48.4   < > 45.0 44.0 43.3  MCV 79.5*  --  82.3  --   --   --  83.3  PLT 195  --  223  --   --   --  287   < > = values in this interval not  displayed.   Basic Metabolic Panel Recent Labs  Lab 10/07/2020 1045 09/27/2020 1045 09/21/20 0517 09/21/20 0517 09/22/20 0355 09/22/20 0355 09/22/20 1752 09/22/20 2011 09/23/20 0408 09/23/20 0952 09/23/20 1043 09/23/20 1338 09/24/20 0357  NA 128*   < > 132*   < > 132*   < > 132* 131* 135 135 136 135 136  K 4.4   < > 4.7   < > 4.8   < > 5.8* 5.3* 5.2* 4.9  4.9 5.1 5.6*  CL 93*  --  96*  --  99  --  96*  --  99  --   --   --  101  CO2 22  --  22  --  20*  --  25  --  23  --   --   --  20*  GLUCOSE 388*  --  351*  --  362*  --  448*  --  127*  --   --   --  301*  BUN 15  --  18  --  29*  --  38*  --  48*  --   --   --  82*  CREATININE 1.29*  --  1.20  --  1.25*  --  1.44*  --  2.77*  --   --   --  5.47*  CALCIUM 9.2  --  9.0  --  8.9  --  8.9  --  9.3  --   --   --  8.6*  PHOS  --   --  3.1  --  3.4  --   --   --  6.0*  --   --   --  5.2*   < > = values in this interval not displayed.    Physical Exam  Blood pressure 122/65, pulse 65, temperature 98.8 F (37.1 C), temperature source Axillary, resp. rate (!) 33, height '5\' 11"'  (1.803 m), weight 95.3 kg, SpO2 92 %. GEN: Intubated, prone, obese ENT: NCAT, ETT in place EYES: Unable to examine CV: Regular PULM: Coarse breath sounds bilaterally ABD: Unable to examine SKIN: No rashes or lesions EXT: Trace lower extremity edema  Assessment 35M severe hypoxic respiratory failure from COVID-19 pneumonia, shock on norepinephrine, anuric AKI with normal baseline GFR  1. Anuric AKI, suspect ATN.  Has Foley catheter.  UA at presentation with heavy proteinuria potentially reflecting baseline CKD; doubt another nondiabetic glomerular disease.  CK negative at presentation.  Check UA again, urine sodium, renal ultrasound.  1 L normal saline bolus given this morning, follow-up labs this afternoon, very likely to require transition to CRRT 2. Severe hypoxic VDRF from COVID-19 pneumonia, management and therapies per CCM 3. Shock on  norepinephrine 4. Hyperkalemia, mild 5. DM2 at baseline 6. Hypertension at baseline, home BP meds held   Plan 1. As above 2. Daily weights, Daily Renal Panel, Strict I/Os, Avoid nephrotoxins (NSAIDs, judicious IV Contrast) 3. Attempted to call and update wife based upon phone number in chart, no answer 4. Will follow closely  Rexene Agent  09/24/2020, 9:32 AM

## 2020-09-24 NOTE — Procedures (Signed)
Central Venous Catheter Insertion Procedure Note  Carlos Bowman  [id number]  September 25, 1951  Date:09/24/20  Time:4:30 PM   Provider Performing:Xitlali Kastens   Procedure: Insertion of Non-tunneled Central Venous Catheter(36556)with US guidance (62836)    Indication(s) Hemodialysis  Consent Risks of the procedure as well as the alternatives and risks of each were explained to the patient and/or caregiver.  Consent for the procedure was obtained and is signed in the bedside chart  Anesthesia Topical only with 1% lidocaine   Timeout Verified patient identification, verified procedure, site/side was marked, verified correct patient position, special equipment/implants available, medications/allergies/relevant history reviewed, required imaging and test results available.  Sterile Technique Maximal sterile technique including full sterile barrier drape, hand hygiene, sterile gown, sterile gloves, mask, hair covering, sterile ultrasound probe cover (if used).  Procedure Description Area of catheter insertion was cleaned with chlorhexidine and draped in sterile fashion.   With real-time ultrasound guidance a 13 French 15 cm HD catheter was placed into the right internal jugular vein.  Nonpulsatile blood flow and easy flushing noted in all ports.  The catheter was sutured in place and sterile dressing applied.      Complications/Tolerance None; patient tolerated the procedure well. Chest X-ray is ordered to verify placement for internal jugular or subclavian cannulation.  Chest x-ray is not ordered for femoral cannulation.  EBL Minimal  Specimen(s) None  Kipp Brood, MD [facility] ICU Physician [place]  Pager: [phone] Mobile: [phone] After hours: [phone].  09/24/2020, 4:32 PM

## 2020-09-24 NOTE — Therapy (Signed)
Pt just tapped and proned  At 2300 tolerated well. Will continue to monitor.

## 2020-09-24 NOTE — Progress Notes (Signed)
PM labs with K 5.6, SCr and BUN further worsened.  BPs stable.  Start CRRT all 4K, net even UF. tirated heparin.   Apppreciate CCM assistance with HD catheter.

## 2020-09-24 NOTE — Progress Notes (Signed)
Pt placed in supine position at this time  With no complications. ET secured with commercial tube holder in proper position. No breakdown noted on face.

## 2020-09-24 NOTE — Progress Notes (Signed)
NAME:  Carlos Edmonston., MRN:  [id number], DOB:  18-Nov-1951, LOS: 4 ADMISSION DATE:  09/15/2020, CONSULTATION DATE:  10/03/2020 REFERRING MD:  Heber [place]- IMTS, CHIEF COMPLAINT:  Dyspnea  Brief History   69 year old man who presented with sudden onset cough shortness of breath with fever and chills over the last few days.  Known ILD and prior smoking.  Not vaccinated against Covid.  In the ED initial saturation 63% started on high flow nasal cannula and nonrebreather with improvement of saturations into the 90s.  Admitted to IMTS.   Past Medical History  ILD, former smoker, DM, HLD, HTN, subclinical hyperthyroidism  Significant Hospital Events   10/12 admit 10/14 tx to ICU   Consults:  PCCM 10/12  Procedures:   Significant Diagnostic Tests:   Micro Data:  10/12 SARS 2 >> positive  Flu >> neg 10/12 BCx 2 >> 1/4 GPC, BCID did not identify organism, likely contaminant   Antimicrobials:  10/12 azithro  10/12 ceftriaxone   10/12 remdesivir >> 10/12 baricitinib >>  Interim history/subjective:  Improving lung compliance with increasing tidal volume for given pressure.  Objective   Blood pressure 109/65, pulse 62, temperature 98.8 F (37.1 C), temperature source Axillary, resp. rate (!) 31, height 5\' 11"  (1.803 m), weight 95.3 kg, SpO2 94 %. CVP:  [7 mmHg] 7 mmHg  Vent Mode: PCV FiO2 (%):  [50 %] 50 % Set Rate:  [35 bmp] 35 bmp PEEP:  [13 cmH20] 13 cmH20 Plateau Pressure:  [37 cmH20] 37 cmH20   Intake/Output Summary (Last 24 hours) at 09/24/2020 1622 Last data filed at 09/24/2020 1300 Gross per 24 hour  Intake 2982.06 ml  Output --  Net 2982.06 ml   Filed Weights   09/23/2020 1040 09/23/20 0344 09/24/20 0355  Weight: 99.8 kg 94.2 kg 95.3 kg   Examination: General: Intubated, sedated in prone position. Neuro: Sedated, no response to pain HEENT: OG tube, NG tube in place with no skin breakdown.  Minimal facial edema. CV: Heart sounds are unremarkable extremities are  warm and well-perfused. PULM: No accessory muscle use bronchial breath sounds posteriorly only.  Airway pressures have decreased  GI: soft, bs + Extremities: warm/dry, no LE edema  GU: Foley catheter in place with minimal urine. Skin: no rashes  Resolved Hospital Problem list    Assessment & Plan:   Critically ill due to acute hypoxic respiratory failure secondary to COVID-19 pneumonia on background history of ILD.  Requiring mechanical ventilation. High airway pressures.  Unable to reduce tidal volume further given borderline hypercarbia. Abrupt onset with fever most consistent with COVID-19.  Unlikely ILD flare. -I decreased peak pressure to 18 achieving a driving pressure of 15 with adequate tidal volumes approximating 6 mL/kg. -We will recheck ABG and decide on reproning this afternoon  COVID-19 pneumonia -Continue decadron 6mg  daily for 10 days -Baricitinib/ remdesivir per pharmacy  -Prognosis guarded given age and underlying lung disease.  Uncontrolled type 2 diabetes DM, exacerbated by steroids  -Transitioned to subcutaneous insulin  HTN at baseline -Holding home antihypertensive medication  Acute kidney injury with oliguria. Even fluid balance since admission, may be volume contracted due insensitive losses Family has consented for dialysis -Initiate CRRT today  Best practice:  Diet:  NPO -tube feed initiated Pain/Anxiety/Delirium protocol (if indicated): Fentanyl and propofol infusion to target RASS -3 to -4 VAP protocol (if indicated): Bundle in place DVT prophylaxis: Unfractionated heparin GI prophylaxis: Pantoprazole Glucose control: Type 2 diabetes on basal bolus insulin with suboptimal control.  Will increase  basal coverage. Mobility: BR  Code Status: Full - verified with patient Family Communication: Updated sister today 10/16.  Wife has dementia.  Sister has consented for dialysis and wishes everything possible to be done for her brother. Disposition:  ICU     Labs   CBC: Recent Labs  Lab 09/24/2020 1045 09/18/2020 1045 09/21/20 0517 09/21/20 0517 09/22/20 0355 09/22/20 2011 09/23/20 0500 09/23/20 0952 09/23/20 1043 09/23/20 1338 09/24/20 0357  WBC 4.4  --  5.4  --  7.4  --  8.2  --   --   --  11.8*  NEUTROABS 3.4  --  4.4  --  6.1  --  6.3  --   --   --  9.2*  HGB 14.7   < > 14.8   < > 15.3   < > 14.7 16.0 15.3 15.0 13.4  HCT 48.1   < > 47.9   < > 48.8   < > 48.4 47.0 45.0 44.0 43.3  MCV 81.4  --  80.8  --  79.5*  --  82.3  --   --   --  83.3  PLT 194  --  201  --  195  --  223  --   --   --  287   < > = values in this interval not displayed.    Basic Metabolic Panel: Recent Labs  Lab 09/21/20 0517 09/21/20 0517 09/22/20 0355 09/22/20 0355 09/22/20 1752 09/22/20 2011 09/23/20 0408 09/23/20 0952 09/23/20 1043 09/23/20 1338 09/24/20 0357  NA 132*   < > 132*   < > 132*   < > 135 135 136 135 136  K 4.7   < > 4.8   < > 5.8*   < > 5.2* 4.9 4.9 5.1 5.6*  CL 96*  --  99  --  96*  --  99  --   --   --  101  CO2 22  --  20*  --  25  --  23  --   --   --  20*  GLUCOSE 351*  --  362*  --  448*  --  127*  --   --   --  301*  BUN 18  --  29*  --  38*  --  48*  --   --   --  82*  CREATININE 1.20  --  1.25*  --  1.44*  --  2.77*  --   --   --  5.47*  CALCIUM 9.0  --  8.9  --  8.9  --  9.3  --   --   --  8.6*  MG 1.9  --  2.2  --   --   --  3.2*  --   --   --  2.9*  PHOS 3.1  --  3.4  --   --   --  6.0*  --   --   --  5.2*   < > = values in this interval not displayed.   GFR: Estimated Creatinine Clearance: 14.6 mL/min (A) (by C-G formula based on SCr of 5.47 mg/dL (H)). Recent Labs  Lab 10/01/2020 1045 09/29/2020 1149 09/19/2020 1240 10/03/2020 1635 09/21/20 0517 09/22/20 0355 09/23/20 0500 09/24/20 0357  PROCALCITON  --   --   --  0.41  --   --   --   --   WBC   < >  --   --   --  5.4  7.4 8.2 11.8*  LATICACIDVEN  --  2.2* 2.5*  --   --   --   --   --    < > = values in this interval not displayed.    Liver Function  Tests: Recent Labs  Lab 09/30/2020 1045 09/21/20 0517 09/22/20 0355 09/23/20 0408 09/24/20 0357  AST 72* 107* 111* 86* 63*  ALT 37 39 34 27 28  ALKPHOS 61 76 139* 186* 182*  BILITOT 0.6 0.3 0.6 0.7 1.0  PROT 7.4 7.1 7.0 6.5 5.9*  ALBUMIN 3.9 3.6 3.4* 3.3* 3.1*   No results for input(s): LIPASE, AMYLASE in the last 168 hours. No results for input(s): AMMONIA in the last 168 hours.  ABG    Component Value Date/Time   PHART 7.250 (L) 09/23/2020 1338   PCO2ART 56.0 (H) 09/23/2020 1338   PO2ART 61 (L) 09/23/2020 1338   HCO3 24.9 09/23/2020 1338   TCO2 27 09/23/2020 1338   ACIDBASEDEF 4.0 (H) 09/23/2020 1338   O2SAT 88.0 09/23/2020 1338     Coagulation Profile: Recent Labs  Lab 09/17/2020 1045  INR 1.1    Cardiac Enzymes: Recent Labs  Lab 09/22/2020 1658  CKTOTAL 188    HbA1C: Hemoglobin A1C  Date/Time Value Ref Range Status  06/20/2020 08:58 AM 8.3 (A) 4.0 - 5.6 % Final  03/14/2020 08:36 AM 7.4 (A) 4.0 - 5.6 % Final   Hgb A1c MFr Bld  Date/Time Value Ref Range Status  09/21/2020 01:36 PM 8.5 (H) 4.8 - 5.6 % Final    Comment:    (NOTE)         Prediabetes: 5.7 - 6.4         Diabetes: >6.4         Glycemic control for adults with diabetes: <7.0   04/28/2019 06:00 AM 10.3 (H) 4.8 - 5.6 % Final    Comment:    (NOTE) Pre diabetes:          5.7%-6.4% Diabetes:              >6.4% Glycemic control for   <7.0% adults with diabetes     CBG: Recent Labs  Lab 09/23/20 2336 09/24/20 0327 09/24/20 0745 09/24/20 1153 09/24/20 1611  GLUCAP 344* 319* 265* 285* 266*    CRITICAL CARE Performed by: Kipp Brood   Total critical care time: 40 minutes  Critical care time was exclusive of separately billable procedures and treating other patients.  Critical care was necessary to treat or prevent imminent or life-threatening deterioration.  Critical care was time spent personally by me on the following activities: development of treatment plan with patient  and/or surrogate as well as nursing, discussions with consultants, evaluation of patient's response to treatment, examination of patient, obtaining history from patient or surrogate, ordering and performing treatments and interventions, ordering and review of laboratory studies, ordering and review of radiographic studies, pulse oximetry, re-evaluation of patient's condition and participation in multidisciplinary rounds.  Kipp Brood, MD [facility] ICU Physician [place]  Pager: [phone] Mobile: [phone] After hours: [phone].

## 2020-09-25 DIAGNOSIS — L8990 Pressure ulcer of unspecified site, unspecified stage: Secondary | ICD-10-CM | POA: Insufficient documentation

## 2020-09-25 DIAGNOSIS — J9601 Acute respiratory failure with hypoxia: Secondary | ICD-10-CM | POA: Diagnosis not present

## 2020-09-25 LAB — PHOSPHORUS: Phosphorus: 7.3 mg/dL — ABNORMAL HIGH (ref 2.5–4.6)

## 2020-09-25 LAB — RENAL FUNCTION PANEL
Albumin: 2.7 g/dL — ABNORMAL LOW (ref 3.5–5.0)
Anion gap: 13 (ref 5–15)
BUN: 89 mg/dL — ABNORMAL HIGH (ref 8–23)
CO2: 21 mmol/L — ABNORMAL LOW (ref 22–32)
Calcium: 7.8 mg/dL — ABNORMAL LOW (ref 8.9–10.3)
Chloride: 104 mmol/L (ref 98–111)
Creatinine, Ser: 5.31 mg/dL — ABNORMAL HIGH (ref 0.61–1.24)
GFR, Estimated: 10 mL/min — ABNORMAL LOW (ref >60)
Glucose, Bld: 212 mg/dL — ABNORMAL HIGH (ref 70–99)
Phosphorus: 5.3 mg/dL — ABNORMAL HIGH (ref 2.5–4.6)
Potassium: 5.3 mmol/L — ABNORMAL HIGH (ref 3.5–5.1)
Sodium: 138 mmol/L (ref 135–145)

## 2020-09-25 LAB — CBC WITH DIFFERENTIAL/PLATELET
Abs Immature Granulocytes: 0.15 10*3/uL — ABNORMAL HIGH (ref 0.00–0.07)
Basophils Absolute: 0 10*3/uL (ref 0.0–0.1)
Basophils Relative: 0 %
Eosinophils Absolute: 0 10*3/uL (ref 0.0–0.5)
Eosinophils Relative: 0 %
HCT: 39.5 % (ref 39.0–52.0)
Hemoglobin: 12.3 g/dL — ABNORMAL LOW (ref 13.0–17.0)
Immature Granulocytes: 1 %
Lymphocytes Relative: 5 %
Lymphs Abs: 0.6 10*3/uL — ABNORMAL LOW (ref 0.7–4.0)
MCH: 25.9 pg — ABNORMAL LOW (ref 26.0–34.0)
MCHC: 31.1 g/dL (ref 30.0–36.0)
MCV: 83.3 fL (ref 80.0–100.0)
Monocytes Absolute: 1.3 10*3/uL — ABNORMAL HIGH (ref 0.1–1.0)
Monocytes Relative: 11 %
Neutro Abs: 8.9 10*3/uL — ABNORMAL HIGH (ref 1.7–7.7)
Neutrophils Relative %: 83 %
Platelets: 191 10*3/uL (ref 150–400)
RBC: 4.74 MIL/uL (ref 4.22–5.81)
RDW: 16.5 % — ABNORMAL HIGH (ref 11.5–15.5)
WBC: 11 10*3/uL — ABNORMAL HIGH (ref 4.0–10.5)
nRBC: 0 % (ref 0.0–0.2)

## 2020-09-25 LAB — GLUCOSE, CAPILLARY
Glucose-Capillary: 183 mg/dL — ABNORMAL HIGH (ref 70–99)
Glucose-Capillary: 225 mg/dL — ABNORMAL HIGH (ref 70–99)
Glucose-Capillary: 234 mg/dL — ABNORMAL HIGH (ref 70–99)
Glucose-Capillary: 242 mg/dL — ABNORMAL HIGH (ref 70–99)
Glucose-Capillary: 273 mg/dL — ABNORMAL HIGH (ref 70–99)
Glucose-Capillary: 279 mg/dL — ABNORMAL HIGH (ref 70–99)

## 2020-09-25 LAB — POCT I-STAT 7, (LYTES, BLD GAS, ICA,H+H)
Acid-base deficit: 7 mmol/L — ABNORMAL HIGH (ref 0.0–2.0)
Bicarbonate: 20.8 mmol/L (ref 20.0–28.0)
Calcium, Ion: 1.07 mmol/L — ABNORMAL LOW (ref 1.15–1.40)
HCT: 42 % (ref 39.0–52.0)
Hemoglobin: 14.3 g/dL (ref 13.0–17.0)
O2 Saturation: 83 %
Patient temperature: 42.9
Potassium: 5.2 mmol/L — ABNORMAL HIGH (ref 3.5–5.1)
Sodium: 138 mmol/L (ref 135–145)
TCO2: 22 mmol/L (ref 22–32)
pCO2 arterial: 62 mmHg — ABNORMAL HIGH (ref 32.0–48.0)
pH, Arterial: 7.165 — CL (ref 7.350–7.450)
pO2, Arterial: 84 mmHg (ref 83.0–108.0)

## 2020-09-25 LAB — COMPREHENSIVE METABOLIC PANEL
ALT: 41 U/L (ref 0–44)
AST: 91 U/L — ABNORMAL HIGH (ref 15–41)
Albumin: 2.7 g/dL — ABNORMAL LOW (ref 3.5–5.0)
Alkaline Phosphatase: 227 U/L — ABNORMAL HIGH (ref 38–126)
Anion gap: 14 (ref 5–15)
BUN: 114 mg/dL — ABNORMAL HIGH (ref 8–23)
CO2: 20 mmol/L — ABNORMAL LOW (ref 22–32)
Calcium: 8 mg/dL — ABNORMAL LOW (ref 8.9–10.3)
Chloride: 104 mmol/L (ref 98–111)
Creatinine, Ser: 7.64 mg/dL — ABNORMAL HIGH (ref 0.61–1.24)
GFR, Estimated: 6 mL/min — ABNORMAL LOW (ref >60)
Glucose, Bld: 266 mg/dL — ABNORMAL HIGH (ref 70–99)
Potassium: 5.9 mmol/L — ABNORMAL HIGH (ref 3.5–5.1)
Sodium: 138 mmol/L (ref 135–145)
Total Bilirubin: 1 mg/dL (ref 0.3–1.2)
Total Protein: 5.6 g/dL — ABNORMAL LOW (ref 6.5–8.1)

## 2020-09-25 LAB — CULTURE, BLOOD (ROUTINE X 2): Culture: NO GROWTH

## 2020-09-25 LAB — POCT ACTIVATED CLOTTING TIME
Activated Clotting Time: 120 seconds
Activated Clotting Time: 125 seconds
Activated Clotting Time: 131 seconds
Activated Clotting Time: 142 seconds

## 2020-09-25 LAB — FERRITIN: Ferritin: 4567 ng/mL — ABNORMAL HIGH (ref 24–336)

## 2020-09-25 LAB — C-REACTIVE PROTEIN: CRP: 5.6 mg/dL — ABNORMAL HIGH (ref <1.0)

## 2020-09-25 LAB — MAGNESIUM: Magnesium: 3.1 mg/dL — ABNORMAL HIGH (ref 1.7–2.4)

## 2020-09-25 LAB — D-DIMER, QUANTITATIVE: D-Dimer, Quant: >20 ug/mL-FEU — ABNORMAL HIGH (ref 0.00–0.50)

## 2020-09-25 LAB — APTT: aPTT: 31 seconds (ref 24–36)

## 2020-09-25 MED ORDER — SODIUM CHLORIDE 0.9 % IV SOLN
INTRAVENOUS | Status: DC | PRN
  Administered 2020-09-25: 500 mL via INTRAVENOUS

## 2020-09-25 MED ORDER — INSULIN DETEMIR 100 UNIT/ML ~~LOC~~ SOLN
45.0000 [IU] | Freq: Two times a day (BID) | SUBCUTANEOUS | Status: DC
  Administered 2020-09-25 – 2020-09-26 (×2): 45 [IU] via SUBCUTANEOUS
  Filled 2020-09-25 (×3): qty 0.45

## 2020-09-25 MED ORDER — BARICITINIB 2 MG PO TABS
2.0000 mg | ORAL_TABLET | Freq: Every day | ORAL | Status: DC
  Administered 2020-09-26: 2 mg
  Filled 2020-09-25: qty 1

## 2020-09-25 NOTE — Procedures (Signed)
Admit: 09/21/2020 LOS: 5  62M severe hypoxic respiratory failure from COVID-19 pneumonia, shock on norepinephrine, anuric AKI with normal baseline GFR  Current CRRT Prescription: Start Date: 10/17 Catheter: Temporary R IJ placed 10/16 by CCM BFR: 250 Pre Blood Pump: 500, 4K DFR: 1500, 4K Replacement Rate: 200, 4K Goal UF: Net even Anticoagulation: Heparin and CRRT circuit, ACT titrated Clotting: None since initiation  S: Just started on CRRT, tolerating well so far BPs are stable, not on pressors Anuric K5.9 and phosphorus 7.3, both are prior to dialysis initiation  O: 10/16 0701 - 10/17 0700 In: 2355.2 [I.V.:879.9; NG/GT:1140; IV Piggyback:335.2] Out: 0   Filed Weights   09/23/20 0344 09/24/20 0355 09/25/20 0500  Weight: 94.2 kg 95.3 kg 98.1 kg    Recent Labs  Lab 09/24/20 0357 09/24/20 0357 09/24/20 1624 09/24/20 1848 09/25/20 0533  NA 136   < > 140 136 138  K 5.6*   < > 5.6* 5.3* 5.9*  CL 101  --  105  --  104  CO2 20*  --  21*  --  20*  GLUCOSE 301*  --  294*  --  266*  BUN 82*  --  96*  --  114*  CREATININE 5.47*  --  6.22*  --  7.64*  CALCIUM 8.6*  --  7.9*  --  8.0*  PHOS 5.2*  --  5.7*  --  7.3*   < > = values in this interval not displayed.   Recent Labs  Lab 09/23/20 0500 09/23/20 0952 09/24/20 0357 09/24/20 1848 09/25/20 0533  WBC 8.2  --  11.8*  --  11.0*  NEUTROABS 6.3  --  9.2*  --  8.9*  HGB 14.7   < > 13.4 12.9* 12.3*  HCT 48.4   < > 43.3 38.0* 39.5  MCV 82.3  --  83.3  --  83.3  PLT 223  --  287  --  191   < > = values in this interval not displayed.    Scheduled Meds: . aspirin  81 mg Per Tube Daily  . [START ON 09-28-20] baricitinib  2 mg Per Tube Daily  . chlorhexidine gluconate (MEDLINE KIT)  15 mL Mouth Rinse BID  . Chlorhexidine Gluconate Cloth  6 each Topical Daily  . dexamethasone  6 mg Per Tube Daily  . docusate  100 mg Per Tube BID  . feeding supplement (PROSource TF)  90 mL Per Tube QID  . heparin injection  (subcutaneous)  5,000 Units Subcutaneous Q8H  . insulin aspart  0-20 Units Subcutaneous Q4H  . insulin aspart  8 Units Subcutaneous Q4H  . insulin detemir  45 Units Subcutaneous Q12H  . linagliptin  5 mg Per Tube Daily  . mouth rinse  15 mL Mouth Rinse 10 times per day  . pantoprazole (PROTONIX) IV  40 mg Intravenous Daily  . polyethylene glycol  17 g Per Tube Daily   Continuous Infusions: .  prismasol BGK 4/2.5 500 mL/hr at 09/25/20 0738  .  prismasol BGK 4/2.5 200 mL/hr at 09/25/20 0738  . sodium chloride 10 mL/hr at 09/25/20 1100  . dextrose    . feeding supplement (VITAL AF 1.2 CAL) 1,000 mL (09/24/20 1842)  . fentaNYL infusion INTRAVENOUS 200 mcg/hr (09/25/20 1100)  . heparin 10,000 units/ 20 mL infusion syringe 850 Units/hr (09/25/20 1102)  . lactated ringers Stopped (09/24/20 0832)  . midazolam 8 mg/hr (09/25/20 1100)  . norepinephrine (LEVOPHED) Adult infusion Stopped (09/24/20 1759)  . prismasol  BGK 4/2.5 1,500 mL/hr at 09/25/20 0738   PRN Meds:.sodium chloride, acetaminophen, dextrose, fentaNYL, heparin, heparin, heparin, midazolam, midazolam, ondansetron **OR** ondansetron (ZOFRAN) IV, phenol, polyethylene glycol  ABG    Component Value Date/Time   PHART 7.228 (L) 09/24/2020 1848   PCO2ART 45.6 09/24/2020 1848   PO2ART 80 (L) 09/24/2020 1848   HCO3 19.0 (L) 09/24/2020 1848   TCO2 20 (L) 09/24/2020 1848   ACIDBASEDEF 8.0 (H) 09/24/2020 1848   O2SAT 93.0 09/24/2020 1848    A/P  1. Dialysis dependent anuric AKI, suspect ATN in context of #2; likely with CKD at baseline and significant microalbuminuria, initial UA without hematuria or pyuria; renal ultrasound 10/16 with normal-sized kidneys and no obstruction.  Initiated CRRT 10/17. 1. Severe hypoxic VDRF from COVID-19 pneumonia, management and therapies per CCM 2. Shock on norepinephrine 3. Hyperkalemia, mild 4. DM2 at baseline 5. Hypertension at baseline, home BP meds held  Continue CRRT for the next 24 to 48  hours, possibly can transition over to intermittent therapies thereafter.  Start net even.  Trend potassium and phosphorus.  Pearson Grippe, MD [facility] pgr [phone]

## 2020-09-25 NOTE — Progress Notes (Signed)
Unable to initiate CRRT overnight due to lack of adequate staffing. Per charge RN, will initiate CRRT with next shift. Will continue to monitor closely.

## 2020-09-25 NOTE — Progress Notes (Signed)
NAME:  Carlos Steil., MRN:  [id number], DOB:  [birth year], LOS: 5 ADMISSION DATE:  09/29/2020, CONSULTATION DATE:  09/23/2020 REFERRING MD:  Heber Belpre- IMTS, CHIEF COMPLAINT:  Dyspnea  Brief History   69 year old man who presented with sudden onset cough shortness of breath with fever and chills over the last few days.  Known ILD and prior smoking.  Not vaccinated against Covid.  In the ED initial saturation 63% started on high flow nasal cannula and nonrebreather with improvement of saturations into the 90s.  Admitted to IMTS.   Past Medical History  ILD, former smoker, DM, HLD, HTN, subclinical hyperthyroidism  Significant Hospital Events   10/12 admit 10/14 tx to ICU   Consults:  PCCM 10/12  Procedures:   Significant Diagnostic Tests:   Micro Data:  10/12 SARS 2 >> positive  Flu >> neg 10/12 BCx 2 >> 1/4 GPC, BCID did not identify organism, likely contaminant   Antimicrobials:  10/12 azithro  10/12 ceftriaxone   10/12 remdesivir >> 10/12 baricitinib >>  Interim history/subjective:  Lung compliance has improved over the last several days. Initiated on CRRT for AKI today.  Objective   Blood pressure (!) 142/61, pulse 74, temperature 98.5 F (36.9 C), temperature source Axillary, resp. rate (!) 31, height 5\' 11"  (1.803 m), weight 98.1 kg, SpO2 95 %. CVP:  [9 mmHg] 9 mmHg  Vent Mode: PCV FiO2 (%):  [50 %-65 %] 65 % Set Rate:  [35 bmp] 35 bmp PEEP:  [13 cmH20] 13 cmH20 Plateau Pressure:  [29 cmH20-31 cmH20] 30 cmH20   Intake/Output Summary (Last 24 hours) at 09/25/2020 1016 Last data filed at 09/25/2020 1000 Gross per 24 hour  Intake 2097.92 ml  Output 383 ml  Net 1714.92 ml   Filed Weights   09/23/20 0344 09/24/20 0355 09/25/20 0500  Weight: 94.2 kg 95.3 kg 98.1 kg   Examination: General: Intubated, sedated in prone position. Neuro: Sedated, winces to pain occasional spontaneous movement. HEENT: OG tube, NG tube in place with no skin breakdown.  Minimal  facial edema. CV: Heart sounds are unremarkable extremities are warm and well-perfused. PULM: No accessory muscle use bronchial breath sounds posteriorly only.  Airway pressures have decreased. Occasional ventilator dyssynchrony not associated with desaturation. GI: soft, bs + Extremities: warm/dry, no LE edema  GU: Foley catheter has been removed given anuria Skin: no rashes  Resolved Hospital Problem list    Assessment & Plan:   Critically ill due to acute hypoxic respiratory failure secondary to COVID-19 pneumonia on background history of ILD.  Requiring mechanical ventilation. High airway pressures.  Unable to reduce tidal volume further given borderline hypercarbia. Abrupt onset with fever most consistent with COVID-19.  Unlikely ILD flare. -Currently has adequate gas exchange with present lung protective ventilator strategy -Supine ABG today will direct further proning. Suspect that he will continue to require proning for the next few days until net negative fluid balance is achieved via CRRT  COVID-19 pneumonia -Continue decadron 6mg  daily for 10 days -Baricitinib/ remdesivir per pharmacy  -Prognosis guarded given age and underlying lung disease.  Uncontrolled type 2 diabetes DM, exacerbated by steroids  -Transitioned to subcutaneous insulin  HTN at baseline -Holding home antihypertensive medication  Acute kidney injury with oliguria. Even fluid balance since admission, may be volume contracted due insensitive losses Family has consented for dialysis -Initiate CRRT today -If tolerating can begin fluid removal which may help ventilator separation.  Best practice:  Diet:  NPO -tube feed initiated, started bowel regimen Pain/Anxiety/Delirium  protocol (if indicated): Fentanyl and propofol infusion to target RASS -3 to -4 VAP protocol (if indicated): Bundle in place DVT prophylaxis: Unfractionated heparin GI prophylaxis: Pantoprazole Glucose control: Type 2 diabetes on  basal bolus insulin with suboptimal control.  Will increase basal coverage. Mobility: BR  Code Status: Full - verified with patient Family Communication: Updated sister today 10/16.  Wife has dementia.  Sister has consented for dialysis and wishes everything possible to be done for her brother. Disposition:  ICU   Labs   CBC: Recent Labs  Lab 09/21/20 0517 09/21/20 0517 09/22/20 0355 09/22/20 2011 09/23/20 0500 09/23/20 0952 09/23/20 1043 09/23/20 1338 09/24/20 0357 09/24/20 1848 09/25/20 0533  WBC 5.4  --  7.4  --  8.2  --   --   --  11.8*  --  11.0*  NEUTROABS 4.4  --  6.1  --  6.3  --   --   --  9.2*  --  8.9*  HGB 14.8   < > 15.3   < > 14.7   < > 15.3 15.0 13.4 12.9* 12.3*  HCT 47.9   < > 48.8   < > 48.4   < > 45.0 44.0 43.3 38.0* 39.5  MCV 80.8  --  79.5*  --  82.3  --   --   --  83.3  --  83.3  PLT 201  --  195  --  223  --   --   --  287  --  191   < > = values in this interval not displayed.    Basic Metabolic Panel: Recent Labs  Lab 09/21/20 0517 09/21/20 0517 09/22/20 0355 09/22/20 0355 09/22/20 1752 09/22/20 2011 09/23/20 0408 09/23/20 0952 09/23/20 1338 09/24/20 0357 09/24/20 1624 09/24/20 1848 09/25/20 0533  NA 132*   < > 132*   < > 132*   < > 135   < > 135 136 140 136 138  K 4.7   < > 4.8   < > 5.8*   < > 5.2*   < > 5.1 5.6* 5.6* 5.3* 5.9*  CL 96*   < > 99   < > 96*  --  99  --   --  101 105  --  104  CO2 22   < > 20*   < > 25  --  23  --   --  20* 21*  --  20*  GLUCOSE 351*   < > 362*   < > 448*  --  127*  --   --  301* 294*  --  266*  BUN 18   < > 29*   < > 38*  --  48*  --   --  82* 96*  --  114*  CREATININE 1.20   < > 1.25*   < > 1.44*  --  2.77*  --   --  5.47* 6.22*  --  7.64*  CALCIUM 9.0   < > 8.9   < > 8.9  --  9.3  --   --  8.6* 7.9*  --  8.0*  MG 1.9  --  2.2  --   --   --  3.2*  --   --  2.9*  --   --  3.1*  PHOS 3.1   < > 3.4  --   --   --  6.0*  --   --  5.2* 5.7*  --  7.3*   < > =  values in this interval not displayed.    GFR: Estimated Creatinine Clearance: 10.6 mL/min (A) (by C-G formula based on SCr of 7.64 mg/dL (H)). Recent Labs  Lab 09/10/2020 1149 10/04/2020 1240 09/16/2020 1635 09/21/20 0517 09/22/20 0355 09/23/20 0500 09/24/20 0357 09/25/20 0533  PROCALCITON  --   --  0.41  --   --   --   --   --   WBC  --   --   --    < > 7.4 8.2 11.8* 11.0*  LATICACIDVEN 2.2* 2.5*  --   --   --   --   --   --    < > = values in this interval not displayed.    Liver Function Tests: Recent Labs  Lab 09/21/20 0517 09/21/20 0517 09/22/20 0355 09/23/20 0408 09/24/20 0357 09/24/20 1624 09/25/20 0533  AST 107*  --  111* 86* 63*  --  91*  ALT 39  --  34 27 28  --  41  ALKPHOS 76  --  139* 186* 182*  --  227*  BILITOT 0.3  --  0.6 0.7 1.0  --  1.0  PROT 7.1  --  7.0 6.5 5.9*  --  5.6*  ALBUMIN 3.6   < > 3.4* 3.3* 3.1* 2.7* 2.7*   < > = values in this interval not displayed.   No results for input(s): LIPASE, AMYLASE in the last 168 hours. No results for input(s): AMMONIA in the last 168 hours.  ABG    Component Value Date/Time   PHART 7.228 (L) 09/24/2020 1848   PCO2ART 45.6 09/24/2020 1848   PO2ART 80 (L) 09/24/2020 1848   HCO3 19.0 (L) 09/24/2020 1848   TCO2 20 (L) 09/24/2020 1848   ACIDBASEDEF 8.0 (H) 09/24/2020 1848   O2SAT 93.0 09/24/2020 1848     Coagulation Profile: Recent Labs  Lab 10/06/2020 1045  INR 1.1    Cardiac Enzymes: Recent Labs  Lab 10/03/2020 1658  CKTOTAL 188    HbA1C: Hemoglobin A1C  Date/Time Value Ref Range Status  06/20/2020 08:58 AM 8.3 (A) 4.0 - 5.6 % Final  03/14/2020 08:36 AM 7.4 (A) 4.0 - 5.6 % Final   Hgb A1c MFr Bld  Date/Time Value Ref Range Status  09/21/2020 01:36 PM 8.5 (H) 4.8 - 5.6 % Final    Comment:    (NOTE)         Prediabetes: 5.7 - 6.4         Diabetes: >6.4         Glycemic control for adults with diabetes: <7.0   04/28/2019 06:00 AM 10.3 (H) 4.8 - 5.6 % Final    Comment:    (NOTE) Pre diabetes:          5.7%-6.4% Diabetes:               >6.4% Glycemic control for   <7.0% adults with diabetes     CBG: Recent Labs  Lab 09/24/20 1611 09/24/20 1933 09/24/20 2357 09/25/20 0340 09/25/20 0758  GLUCAP 266* 291* 247* 242* 279*    CRITICAL CARE Performed by: Kipp Brood   Total critical care time: 40 minutes  Critical care time was exclusive of separately billable procedures and treating other patients.  Critical care was necessary to treat or prevent imminent or life-threatening deterioration.  Critical care was time spent personally by me on the following activities: development of treatment plan with patient and/or surrogate as well as nursing, discussions with consultants, evaluation of  patient's response to treatment, examination of patient, obtaining history from patient or surrogate, ordering and performing treatments and interventions, ordering and review of laboratory studies, ordering and review of radiographic studies, pulse oximetry, re-evaluation of patient's condition and participation in multidisciplinary rounds.  Kipp Brood, MD [facility] ICU Physician [place]  Pager: [phone] Mobile: [phone] After hours: [phone].

## 2020-09-25 NOTE — Progress Notes (Signed)
Pt supined at this time with no complications. Et secured with commercial tube holder. No breakdown noted on face. VS within normal limits

## 2020-09-26 ENCOUNTER — Inpatient Hospital Stay (HOSPITAL_COMMUNITY): Payer: Medicare HMO

## 2020-09-26 DIAGNOSIS — N179 Acute kidney failure, unspecified: Secondary | ICD-10-CM

## 2020-09-26 DIAGNOSIS — R0603 Acute respiratory distress: Secondary | ICD-10-CM

## 2020-09-26 DIAGNOSIS — J9601 Acute respiratory failure with hypoxia: Secondary | ICD-10-CM | POA: Diagnosis not present

## 2020-09-26 LAB — POCT I-STAT 7, (LYTES, BLD GAS, ICA,H+H)
Acid-base deficit: 3 mmol/L — ABNORMAL HIGH (ref 0.0–2.0)
Acid-base deficit: 20 mmol/L — ABNORMAL HIGH (ref 0.0–2.0)
Bicarbonate: 26.4 mmol/L (ref 20.0–28.0)
Bicarbonate: 15.1 mmol/L — ABNORMAL LOW (ref 20.0–28.0)
Calcium, Ion: 1.07 mmol/L — ABNORMAL LOW (ref 1.15–1.40)
Calcium, Ion: 1.02 mmol/L — ABNORMAL LOW (ref 1.15–1.40)
HCT: 37 % — ABNORMAL LOW (ref 39.0–52.0)
HCT: 35 % — ABNORMAL LOW (ref 39.0–52.0)
Hemoglobin: 12.6 g/dL — ABNORMAL LOW (ref 13.0–17.0)
Hemoglobin: 11.9 g/dL — ABNORMAL LOW (ref 13.0–17.0)
O2 Saturation: 74 %
O2 Saturation: 74 %
Patient temperature: 38
Potassium: 5.4 mmol/L — ABNORMAL HIGH (ref 3.5–5.1)
Potassium: 6.1 mmol/L — ABNORMAL HIGH (ref 3.5–5.1)
Sodium: 137 mmol/L (ref 135–145)
Sodium: 137 mmol/L (ref 135–145)
TCO2: 28 mmol/L (ref 22–32)
TCO2: 18 mmol/L — ABNORMAL LOW (ref 22–32)
pCO2 arterial: 68.7 mmHg (ref 32.0–48.0)
pCO2 arterial: 90.9 mmHg (ref 32.0–48.0)
pH, Arterial: 7.199 — CL (ref 7.350–7.450)
pH, Arterial: 6.829 — CL (ref 7.350–7.450)
pO2, Arterial: 52 mmHg — ABNORMAL LOW (ref 83.0–108.0)
pO2, Arterial: 72 mmHg — ABNORMAL LOW (ref 83.0–108.0)

## 2020-09-26 LAB — POCT ACTIVATED CLOTTING TIME
Activated Clotting Time: 147 seconds
Activated Clotting Time: 147 seconds
Activated Clotting Time: 153 seconds
Activated Clotting Time: 158 seconds
Activated Clotting Time: 158 seconds
Activated Clotting Time: 158 seconds
Activated Clotting Time: 158 seconds
Activated Clotting Time: 180 seconds
Activated Clotting Time: 186 seconds
Activated Clotting Time: 197 seconds
Activated Clotting Time: 208 seconds
Activated Clotting Time: 213 seconds
Activated Clotting Time: 224 seconds
Activated Clotting Time: 230 seconds
Activated Clotting Time: 224 seconds
Activated Clotting Time: 219 seconds
Activated Clotting Time: 224 seconds
Activated Clotting Time: 252 seconds
Activated Clotting Time: 279 seconds

## 2020-09-26 LAB — RENAL FUNCTION PANEL
Albumin: 2.6 g/dL — ABNORMAL LOW (ref 3.5–5.0)
Anion gap: 14 (ref 5–15)
BUN: 73 mg/dL — ABNORMAL HIGH (ref 8–23)
CO2: 22 mmol/L (ref 22–32)
Calcium: 7.8 mg/dL — ABNORMAL LOW (ref 8.9–10.3)
Chloride: 102 mmol/L (ref 98–111)
Creatinine, Ser: 4.41 mg/dL — ABNORMAL HIGH (ref 0.61–1.24)
GFR, Estimated: 13 mL/min — ABNORMAL LOW (ref >60)
Glucose, Bld: 151 mg/dL — ABNORMAL HIGH (ref 70–99)
Phosphorus: 4.2 mg/dL (ref 2.5–4.6)
Potassium: 5.1 mmol/L (ref 3.5–5.1)
Sodium: 138 mmol/L (ref 135–145)

## 2020-09-26 LAB — TRIGLYCERIDES: Triglycerides: 124 mg/dL (ref <150)

## 2020-09-26 LAB — APTT: aPTT: >200 seconds (ref 24–36)

## 2020-09-26 LAB — GLUCOSE, CAPILLARY
Glucose-Capillary: 149 mg/dL — ABNORMAL HIGH (ref 70–99)
Glucose-Capillary: 180 mg/dL — ABNORMAL HIGH (ref 70–99)
Glucose-Capillary: 131 mg/dL — ABNORMAL HIGH (ref 70–99)

## 2020-09-26 LAB — MAGNESIUM: Magnesium: 2.6 mg/dL — ABNORMAL HIGH (ref 1.7–2.4)

## 2020-09-26 MED ORDER — OXYCODONE HCL 5 MG/5ML PO SOLN
10.0000 mg | ORAL | Status: DC
  Administered 2020-09-26: 10 mg
  Filled 2020-09-26: qty 10

## 2020-09-26 MED ORDER — ARTIFICIAL TEARS OPHTHALMIC OINT
1.0000 "application " | TOPICAL_OINTMENT | Freq: Three times a day (TID) | OPHTHALMIC | Status: DC
  Administered 2020-09-26: 1 via OPHTHALMIC
  Filled 2020-09-26: qty 3.5

## 2020-09-26 MED ORDER — INSULIN DETEMIR 100 UNIT/ML ~~LOC~~ SOLN
50.0000 [IU] | Freq: Two times a day (BID) | SUBCUTANEOUS | Status: DC
  Filled 2020-09-26: qty 0.5

## 2020-09-26 MED ORDER — INSULIN ASPART 100 UNIT/ML ~~LOC~~ SOLN
12.0000 [IU] | SUBCUTANEOUS | Status: DC
  Administered 2020-09-26: 12 [IU] via SUBCUTANEOUS

## 2020-09-26 MED ORDER — CISATRACURIUM BESYLATE 20 MG/10ML IV SOLN
10.0000 mg | INTRAVENOUS | Status: DC | PRN
  Administered 2020-09-26: 10 mg via INTRAVENOUS
  Filled 2020-09-26: qty 10

## 2020-09-26 MED ORDER — PRISMASOL BGK 0/2.5 32-2.5 MEQ/L IV SOLN
INTRAVENOUS | Status: DC
  Filled 2020-09-26: qty 5000

## 2020-09-26 MED ORDER — NOREPINEPHRINE 16 MG/250ML-% IV SOLN
0.0000 ug/min | INTRAVENOUS | Status: DC
  Filled 2020-09-26: qty 250

## 2020-09-26 MED ORDER — CLONAZEPAM 1 MG PO TABS
1.0000 mg | ORAL_TABLET | Freq: Two times a day (BID) | ORAL | Status: DC
  Administered 2020-09-26: 1 mg
  Filled 2020-09-26: qty 1

## 2020-09-26 MED ORDER — SODIUM CHLORIDE 0.9 % IV SOLN
0.5000 ug/kg/min | INTRAVENOUS | Status: DC
  Administered 2020-09-26: 3 ug/kg/min via INTRAVENOUS
  Filled 2020-09-26: qty 20

## 2020-09-26 MED ORDER — POLYETHYLENE GLYCOL 3350 17 G PO PACK: 17.0000 g | PACK | Freq: Two times a day (BID) | ORAL | Status: DC

## 2020-09-26 MED ORDER — SODIUM BICARBONATE 8.4 % IV SOLN
INTRAVENOUS | Status: AC
  Filled 2020-09-26: qty 100

## 2020-09-26 MED ORDER — BISACODYL 10 MG RE SUPP
10.0000 mg | Freq: Once | RECTAL | Status: AC
  Administered 2020-09-26: 10 mg via RECTAL
  Filled 2020-09-26: qty 1

## 2020-09-26 MED ORDER — VASOPRESSIN 20 UNITS/100 ML INFUSION FOR SHOCK
0.0000 [IU]/min | INTRAVENOUS | Status: DC
  Administered 2020-09-26: 0.03 [IU]/min via INTRAVENOUS
  Filled 2020-09-26: qty 100

## 2020-09-27 ENCOUNTER — Ambulatory Visit: Payer: Medicare HMO | Admitting: Pulmonary Disease

## 2020-10-10 NOTE — Progress Notes (Addendum)
NAME:  Carlos Bowman., MRN:  [id number], DOB:  1951-09-05, LOS: 6 ADMISSION DATE:  09/13/2020, CONSULTATION DATE:  09/11/2020 REFERRING MD:  Heber [place]ifty- IMTS, CHIEF COMPLAINT:  Dyspnea  Brief History   69 year old man who presented with sudden onset cough shortness of breath with fever and chills over the last few days.  Known ILD and prior smoking.  Not vaccinated against Covid.  In the ED initial saturation 63% started on high flow nasal cannula and nonrebreather with improvement of saturations into the 90s.  Admitted to IMTS.   Past Medical History  ILD, former smoker, DM, HLD, HTN, subclinical hyperthyroidism  Significant Hospital Events   10/12 admit 10/14 tx to ICU/ intubated/ proned 10/16 CRRT started   Consults:  PCCM 10/12  Procedures:  10/14 ETT >> 10/14 L IJ CVL >> 10/16 R IJ trialysis >>  Significant Diagnostic Tests:   Micro Data:  10/12 SARS 2 >> positive  Flu >> neg 10/12 BCx 2 >> 1/4 GPC, BCID did not identify organism, likely contaminant   Antimicrobials:  10/12 azithro  10/12 ceftriaxone   10/12 remdesivir >> 10/12 baricitinib >>  Interim history/subjective:  Desaturations/ vent asynchrony overnight, BIS has been high/ 80's despite fentanyl 200 mcg/hr and versed 10 mg/hr, improved with boluses then prn paralytics used Remains on CRRT - goal even balance  Remains off vasopressors  - 216/ net +5.4L tmax 100.7  Objective   Blood pressure (!) 155/74, pulse (!) 124, temperature (!) 100.7 F (38.2 C), temperature source Oral, resp. rate (!) 30, height 5\' 11"  (1.803 m), weight 95.6 kg, SpO2 95 %.    Vent Mode: PCV FiO2 (%):  [50 %-90 %] 90 % Set Rate:  [35 bmp] 35 bmp PEEP:  [13 cmH20] 13 cmH20 Plateau Pressure:  [28 cmH20-31 cmH20] 28 cmH20   Intake/Output Summary (Last 24 hours) at 09/28/20 0817 Last data filed at 09-28-2020 0700 Gross per 24 hour  Intake 2452.08 ml  Output 2732 ml  Net -279.92 ml   Filed Weights   09/24/20 0355 09/25/20  0500 2020-09-28 0453  Weight: 95.3 kg 98.1 kg 95.6 kg   Examination: General:  Critically ill older male intubated/ sedated on MV/ prone HEENT: ETT/ OGT Neuro: RASS -5 CV: ST, warm extremities PULM:  MV supported breaths, coarse, more diminished on right, scant secretions, Pplat 28, driving pressure 15 GI: no foley, +bs Extremities: warm/dry, no LE edema  Skin: no rashes   ABG 7.199/ 68/ 52/ 26 sCr 5.31-> 4.41  Resolved Hospital Problem list    Assessment & Plan:   Critically ill due to acute hypoxic respiratory failure secondary to COVID-19 pneumonia on background history of ILD.  Requiring mechanical ventilation. High airway pressures.  Unable to reduce tidal volume further given borderline hypercarbia. Abrupt onset with fever most consistent with COVID-19.  Unlikely ILD flare. Slightly worse respiratory acidosis this morning, on PC with TV~460 which is around his 6 cc/kgIBW.  Recheck ABG at 1700  Continue mechanical ventilation per ARDS protocol Target TVol 6-8cc/kgIBW Target Plateau Pressure < 30cm H20 Target driving pressure less than 15 cm of water Target PaO2 55-65: titrate PEEP/ FiO2 per protocol As long as PaO2 to FiO2 ratio is less than 1:150 position in prone position for 16 hours a day Check CVP daily  Fluid balance per CRRT/ nephrology  Ventilator associated pneumonia prevention protocol Will increase fentanyl ceiling on gtt, continue versed for RASS goal -5 with prn nimbex for vent synchrony with ongoing BIS monitoring; may need nimbex  gtt. Will start enteral sedation.    COVID-19 pneumonia Continue decadron 6mg  daily for 10 days Baricitinib/ remdesivir per pharmacy  Prognosis guarded given age and underlying lung disease.  Uncontrolled type 2 diabetes DM, exacerbated by steroids  SSI resistant TF coverage novolog 8-> 12 untis q 4hrs Levemir 45-> 50 units BID Linagliptin 5mg  daily   HTN at baseline Holding home antihypertensive medication  Acute kidney  injury with oliguria-> anuric  Even fluid balance since admission, may be volume contracted due insensitive losses CRRT started 10/16 Appreciate Nephrology assistance Ongoing CRRT   Best practice:  Diet:  NPO/ EN/ on bowel regimen Pain/Anxiety/Delirium protocol (if indicated): Fentanyl / versed for RASS goal -5 VAP protocol (if indicated): Bundle in place DVT prophylaxis: heparin SQ GI prophylaxis: Pantoprazole Glucose control: as above Mobility: BR  Code Status: Full  Family Communication:  Wife has dementia.  Sister, Vaughan Basta has consented for dialysis and wishes everything possible to be done for her brother.  Linda updated 10/18; remains hopeful and continues to want full aggressive  Disposition:  ICU   Labs   CBC: Recent Labs  Lab 09/21/20 0517 09/21/20 0517 09/22/20 0355 09/22/20 2011 09/23/20 0500 09/23/20 0952 09/24/20 0357 09/24/20 1848 09/25/20 0533 09/25/20 1643 10-09-20 0750  WBC 5.4  --  7.4  --  8.2  --  11.8*  --  11.0*  --   --   NEUTROABS 4.4  --  6.1  --  6.3  --  9.2*  --  8.9*  --   --   HGB 14.8   < > 15.3   < > 14.7   < > 13.4 12.9* 12.3* 14.3 12.6*  HCT 47.9   < > 48.8   < > 48.4   < > 43.3 38.0* 39.5 42.0 37.0*  MCV 80.8  --  79.5*  --  82.3  --  83.3  --  83.3  --   --   PLT 201  --  195  --  223  --  287  --  191  --   --    < > = values in this interval not displayed.    Basic Metabolic Panel: Recent Labs  Lab 09/22/20 0355 09/22/20 1752 09/23/20 0408 09/23/20 7782 09/24/20 0357 09/24/20 0357 09/24/20 1624 09/24/20 1848 09/25/20 0533 09/25/20 1643 09/25/20 1702 Oct 09, 2020 0433 2020/10/09 0750  NA 132*   < > 135   < > 136   < > 140   < > 138 138 138 138 137  K 4.8   < > 5.2*   < > 5.6*   < > 5.6*   < > 5.9* 5.2* 5.3* 5.1 5.4*  CL 99   < > 99  --  101  --  105  --  104  --  104 102  --   CO2 20*   < > 23  --  20*  --  21*  --  20*  --  21* 22  --   GLUCOSE 362*   < > 127*  --  301*  --  294*  --  266*  --  212* 151*  --   BUN 29*   < >  48*  --  82*  --  96*  --  114*  --  89* 73*  --   CREATININE 1.25*   < > 2.77*  --  5.47*  --  6.22*  --  7.64*  --  5.31* 4.41*  --  CALCIUM 8.9   < > 9.3  --  8.6*  --  7.9*  --  8.0*  --  7.8* 7.8*  --   MG 2.2  --  3.2*  --  2.9*  --   --   --  3.1*  --   --  2.6*  --   PHOS 3.4  --  6.0*  --  5.2*  --  5.7*  --  7.3*  --  5.3* 4.2  --    < > = values in this interval not displayed.   GFR: Estimated Creatinine Clearance: 18.1 mL/min (A) (by C-G formula based on SCr of 4.41 mg/dL (H)). Recent Labs  Lab 09/09/2020 1149 10/05/2020 1240 10/05/2020 1635 09/21/20 0517 09/22/20 0355 09/23/20 0500 09/24/20 0357 09/25/20 0533  PROCALCITON  --   --  0.41  --   --   --   --   --   WBC  --   --   --    < > 7.4 8.2 11.8* 11.0*  LATICACIDVEN 2.2* 2.5*  --   --   --   --   --   --    < > = values in this interval not displayed.    Liver Function Tests: Recent Labs  Lab 09/21/20 0517 09/21/20 0517 09/22/20 0355 09/22/20 0355 09/23/20 0408 09/23/20 0408 09/24/20 0357 09/24/20 1624 09/25/20 0533 09/25/20 1702 10/15/2020 0433  AST 107*  --  111*  --  86*  --  63*  --  91*  --   --   ALT 39  --  34  --  27  --  28  --  41  --   --   ALKPHOS 76  --  139*  --  186*  --  182*  --  227*  --   --   BILITOT 0.3  --  0.6  --  0.7  --  1.0  --  1.0  --   --   PROT 7.1  --  7.0  --  6.5  --  5.9*  --  5.6*  --   --   ALBUMIN 3.6   < > 3.4*   < > 3.3*   < > 3.1* 2.7* 2.7* 2.7* 2.6*   < > = values in this interval not displayed.   No results for input(s): LIPASE, AMYLASE in the last 168 hours. No results for input(s): AMMONIA in the last 168 hours.  ABG    Component Value Date/Time   PHART 7.199 (LL) 10-15-2020 0750   PCO2ART 68.7 (HH) 2020-10-15 0750   PO2ART 52 (L) October 15, 2020 0750   HCO3 26.4 10/15/20 0750   TCO2 28 Oct 15, 2020 0750   ACIDBASEDEF 3.0 (H) 10-15-20 0750   O2SAT 74.0 2020/10/15 0750     Coagulation Profile: Recent Labs  Lab 10/02/2020 1045  INR 1.1    Cardiac  Enzymes: Recent Labs  Lab 10/05/2020 1658  CKTOTAL 188    HbA1C: Hemoglobin A1C  Date/Time Value Ref Range Status  06/20/2020 08:58 AM 8.3 (A) 4.0 - 5.6 % Final  03/14/2020 08:36 AM 7.4 (A) 4.0 - 5.6 % Final   Hgb A1c MFr Bld  Date/Time Value Ref Range Status  09/21/2020 01:36 PM 8.5 (H) 4.8 - 5.6 % Final    Comment:    (NOTE)         Prediabetes: 5.7 - 6.4         Diabetes: >6.4  Glycemic control for adults with diabetes: <7.0   04/28/2019 06:00 AM 10.3 (H) 4.8 - 5.6 % Final    Comment:    (NOTE) Pre diabetes:          5.7%-6.4% Diabetes:              >6.4% Glycemic control for   <7.0% adults with diabetes     CBG: Recent Labs  Lab 09/25/20 1508 09/25/20 1940 09/25/20 2336 October 02, 2020 0354 10/02/2020 0759  GLUCAP 234* 225* 183* 149* 180*    CRITICAL CARE Performed by: Kennieth Rad   Total critical care time: 40 minutes  Critical care time was exclusive of separately billable procedures and treating other patients.  Critical care was necessary to treat or prevent imminent or life-threatening deterioration.  Critical care was time spent personally by me on the following activities: development of treatment plan with patient and/or surrogate as well as nursing, discussions with consultants, evaluation of patient's response to treatment, examination of patient, obtaining history from patient or surrogate, ordering and performing treatments and interventions, ordering and review of laboratory studies, ordering and review of radiographic studies, pulse oximetry, re-evaluation of patient's condition and participation in multidisciplinary rounds.   Kennieth Rad, ACNP  Pulmonary & Critical Care 10-02-2020, 8:17 AM

## 2020-10-10 NOTE — Progress Notes (Addendum)
Admit: 09/29/2020 LOS: 6  27M severe hypoxic respiratory failure from COVID-19 pneumonia, shock on norepinephrine, anuric AKI with normal baseline GFR  Current CRRT Prescription: Start Date: 10/17 Catheter: Temporary R IJ placed 10/16 by CCM BFR: 250 Pre Blood Pump: 500, 4K DFR: 1500, 4K Replacement Rate: 200, 4K Goal UF: Net even Anticoagulation: Heparin and CRRT circuit, ACT titrated Clotting: None since initiation  S: Remains anuric.  Tolerating CRRT.  FiO2 requirements going up.  Persistently acidemic.  Now on leave  O: 10/17 0701 - 10/18 0700 In: 2515.1 [P.O.:120; I.V.:955.1; NG/GT:1440] Out: 2732   DID NOT EXAMINE PATIENT TODAY IN THE SETTING OF THE COVID 19 PANDEMIC AND TO CONSERVE PPE  Filed Weights   09/24/20 0355 09/25/20 0500 10-21-20 0453  Weight: 95.3 kg 98.1 kg 95.6 kg    Recent Labs  Lab 09/25/20 0533 09/25/20 1643 09/25/20 1702 October 21, 2020 0433 10-21-20 0750  NA 138   < > 138 138 137  K 5.9*   < > 5.3* 5.1 5.4*  CL 104  --  104 102  --   CO2 20*  --  21* 22  --   GLUCOSE 266*  --  212* 151*  --   BUN 114*  --  89* 73*  --   CREATININE 7.64*  --  5.31* 4.41*  --   CALCIUM 8.0*  --  7.8* 7.8*  --   PHOS 7.3*  --  5.3* 4.2  --    < > = values in this interval not displayed.   Recent Labs  Lab 09/23/20 0500 09/23/20 0952 09/24/20 0357 09/24/20 1848 09/25/20 0533 09/25/20 1643 2020-10-21 0750  WBC 8.2  --  11.8*  --  11.0*  --   --   NEUTROABS 6.3  --  9.2*  --  8.9*  --   --   HGB 14.7   < > 13.4   < > 12.3* 14.3 12.6*  HCT 48.4   < > 43.3   < > 39.5 42.0 37.0*  MCV 82.3  --  83.3  --  83.3  --   --   PLT 223  --  287  --  191  --   --    < > = values in this interval not displayed.    Scheduled Meds: . artificial tears  1 application Both Eyes O9G  . aspirin  81 mg Per Tube Daily  . baricitinib  2 mg Per Tube Daily  . chlorhexidine gluconate (MEDLINE KIT)  15 mL Mouth Rinse BID  . Chlorhexidine Gluconate Cloth  6 each Topical Daily  .  dexamethasone  6 mg Per Tube Daily  . docusate  100 mg Per Tube BID  . feeding supplement (PROSource TF)  90 mL Per Tube QID  . heparin injection (subcutaneous)  5,000 Units Subcutaneous Q8H  . insulin aspart  0-20 Units Subcutaneous Q4H  . insulin aspart  8 Units Subcutaneous Q4H  . insulin detemir  45 Units Subcutaneous Q12H  . linagliptin  5 mg Per Tube Daily  . mouth rinse  15 mL Mouth Rinse 10 times per day  . pantoprazole (PROTONIX) IV  40 mg Intravenous Daily  . polyethylene glycol  17 g Per Tube Daily   Continuous Infusions: .  prismasol BGK 4/2.5 500 mL/hr at 09/25/20 1835  .  prismasol BGK 4/2.5 200 mL/hr at 09/25/20 0738  . sodium chloride 10 mL/hr at 10-21-20 0900  . cisatracurium (NIMBEX) infusion    . dextrose    .  feeding supplement (VITAL AF 1.2 CAL) 1,000 mL (09/25/20 1713)  . fentaNYL infusion INTRAVENOUS 250 mcg/hr (10-23-20 0900)  . heparin 10,000 units/ 20 mL infusion syringe 1,900 Units/hr (10-23-2020 0927)  . midazolam 10 mg/hr (October 23, 2020 0900)  . norepinephrine (LEVOPHED) Adult infusion 4 mcg/min (October 23, 2020 9923)  . prismasol BGK 4/2.5 1,500 mL/hr at 23-Oct-2020 0730   PRN Meds:.sodium chloride, acetaminophen, dextrose, fentaNYL, heparin, heparin, heparin, midazolam, midazolam, ondansetron **OR** ondansetron (ZOFRAN) IV, phenol  ABG    Component Value Date/Time   PHART 7.199 (LL) 10/23/20 0750   PCO2ART 68.7 (HH) 2020/10/23 0750   PO2ART 52 (L) 10/23/20 0750   HCO3 26.4 23-Oct-2020 0750   TCO2 28 10-23-20 0750   ACIDBASEDEF 3.0 (H) 10-23-20 0750   O2SAT 74.0 23-Oct-2020 0750    A/P  1. Dialysis dependent anuric AKI, suspect ATN in context of #2; likely with CKD at baseline and significant microalbuminuria, initial UA without hematuria or pyuria; renal ultrasound 10/16 with normal-sized kidneys and no obstruction.  Initiated CRRT 10/17, will continue with goal to make him net even. Possible transition to IHD later this week? 2. Severe hypoxic VDRF from  COVID-19 pneumonia, management and therapies per CCM 3. Shock on norepinephrine 4. Hyperkalemia, mild. Changed to 2K/2.5cal post filter fluid 5. DM2 at baseline 6. Hypertension at baseline, home BP meds held 7. Acidemia, worsening acidemia seems primarily due to a respiratory component   Gean Quint, MD [facility]

## 2020-10-10 NOTE — Therapy (Signed)
Pt re-tapped and proned at this time. Tolerated well

## 2020-10-10 NOTE — Death Summary Note (Signed)
DEATH SUMMARY   Patient Details  Name: Carlos Bowman. MRN: [id number] DOB: 1951-10-15  Admission/Discharge Information   Admit Date:  Oct 06, 2020  Date of Death: Date of Death: 2020-10-12  Time of Death: Time of Death: 28  Length of Stay: 03-01-2023  Referring Physician: No primary care provider on file.   Reason(s) for Hospitalization  COVID-19 pneumonia  Diagnoses  Preliminary cause of death: COVID-19 pneumonia Secondary Diagnoses (including complications and co-morbidities):  Active Problems:   COVID-19   Pulmonary fibrosis ([place])   COVID-19 virus infection   Pressure injury of skin   Acute respiratory distress   AKI (acute kidney injury) [facility])   Brief Hospital Course (including significant findings, care, treatment, and services provided and events leading to death)  Carlos Bowman. is a 69 y.o. year old male who presented with sudden onset of cough and shortness of breath. Known ILD. Initially admitted to internal medicine service but will progressively worsened and brought to ICU where he required intubation for increasing confusion. High airway pressures from the outset required prone ventilation. Patient developed acute kidney injury and anuria requiring initiation of CRRT. In spite of this, worsening acidosis with onset of refractory shock requiring initiation of vasopressors. Patient began to have coffee-ground emesis. At this point the patient was on maximal vasopressor support and given his underlying lung disease and severe ARDS and developing multisystem organ failure the patient was transitioned to comfort care after discussion with the family as there were no further therapeutic options. He was compassionately extubated.     Pertinent Labs and Studies  Significant Diagnostic Studies US RENAL  Result Date: 09/24/2020 CLINICAL DATA:  Acute kidney injury. EXAM: RENAL / URINARY TRACT ULTRASOUND COMPLETE COMPARISON:  None. FINDINGS: Right Kidney: Renal measurements:  12.3 x 5.8 x 5.4 cm = volume: 202.8 mL. Echogenicity within normal limits. No mass or hydronephrosis visualized. Left Kidney: Renal measurements: 12.3 x 6.2 x 6.2 cm = volume: 23624 mL. Echogenicity within normal limits. No mass or hydronephrosis visualized. Bladder: Decompressed, not evaluated. Other: None. IMPRESSION: Normal sonographic appearance of the kidneys. Electronically Signed   By: Lajean Manes M.D.   On: 09/24/2020 18:55   DG CHEST PORT 1 VIEW  Result Date: 09/24/2020 CLINICAL DATA:  Central line placement EXAM: PORTABLE CHEST 1 VIEW COMPARISON:  09/22/2020 FINDINGS: Left central line, endotracheal tube and NG tube remain in place, unchanged. Placement of right Central line with the tip in the inferior right innominate vein. No pneumothorax. Heart is normal size. Diffuse interstitial prominence throughout the lungs. No real change since prior study. IMPRESSION: Interval placement of right central line with the tip in the lower right innominate vein near the upper SVC. Otherwise no change. Electronically Signed   By: Rolm Baptise M.D.   On: 09/24/2020 16:38   DG CHEST PORT 1 VIEW  Result Date: 09/22/2020 CLINICAL DATA:  Status post intubation EXAM: PORTABLE CHEST 1 VIEW COMPARISON:  Film from earlier in the same day. FINDINGS: Cardiac shadow is stable. Endotracheal tube and gastric catheter are noted in satisfactory position. Left jugular central line is noted with the tip at the cavoatrial junction. No pneumothorax is seen. Diffuse bilateral airspace opacities are again identified but slightly improved following intubation consistent with the given clinical history. IMPRESSION: Improved aeration. Tubes and lines as described above without complicating factors. Electronically Signed   By: Inez Catalina M.D.   On: 09/22/2020 18:13   DG Chest Port 1 View  Result Date: 09/22/2020 CLINICAL DATA:  Acute  respiratory distress EXAM: PORTABLE CHEST 1 VIEW COMPARISON:  09/21/2020 FINDINGS: Cardiac  shadow is stable. Diffuse airspace opacity is noted throughout both lungs increased in the interval from the prior exam. No sizable effusion is seen. No pneumothorax is noted. No bony abnormality is seen. IMPRESSION: Diffuse increase in airspace opacity bilaterally consistent with progressive viral infection and possibly some superimposed edema. Electronically Signed   By: Inez Catalina M.D.   On: 09/22/2020 15:18   DG Chest Portable 1 View  Result Date: 10/04/2020 CLINICAL DATA:  Fatigue, shortness of breath, and fever.  Hypoxia. EXAM: PORTABLE CHEST 1 VIEW COMPARISON:  Chest radiograph 04/26/2019 and chest CT 07/22/2020 FINDINGS: The cardiac silhouette is upper limits of normal in size. The lungs are mildly hypoinflated. Interstitial densities are present in the lungs bilaterally, greatest in the bases. These have increased from the 2020 chest radiograph, most notably in the right upper lobe, however there is likely less of a change compared to the interval CT. No sizable pleural effusion or pneumothorax is identified. No acute osseous abnormality is seen. IMPRESSION: Chronic interstitial lung disease, possibly with superimposed atypical or viral infection. Electronically Signed   By: Logan Bores M.D.   On: 10/08/2020 11:19    Microbiology Recent Results (from the past 240 hour(s))  Respiratory Panel by RT PCR (Flu A&B, Covid) - Nasopharyngeal Swab     Status: Abnormal   Collection Time: 09/16/2020 10:52 AM   Specimen: Nasopharyngeal Swab  Result Value Ref Range Status   SARS Coronavirus 2 by RT PCR POSITIVE (A) NEGATIVE Final    Comment: emailed L. Berdik RN 11:50 09/22/2020 ([email or web address]) (NOTE) SARS-CoV-2 target nucleic acids are DETECTED.  SARS-CoV-2 RNA is generally detectable in upper respiratory specimens  during the acute phase of infection. Positive results are indicative of the presence of the identified virus, but do not rule out bacterial infection or co-infection with other pathogens  not detected by the test. Clinical correlation with patient history and other diagnostic information is necessary to determine patient infection status. The expected result is Negative.  Fact Sheet for Patients:  PinkCheek.be  Fact Sheet for Healthcare Providers: GravelBags.it  This test is not yet approved or cleared by the [place] FDA and  has been authorized for detection and/or diagnosis of SARS-CoV-2 by FDA under an Emergency Use Authorization (EUA).  This EUA will remain in effect (meaning this test can be used) for the duration of  the COVID-19  declaration under Section 564(b)(1) of the Act, 21 U.S.C. section 360bbb-3(b)(1), unless the authorization is terminated or revoked sooner.      Influenza A by PCR NEGATIVE NEGATIVE Final   Influenza B by PCR NEGATIVE NEGATIVE Final    Comment: (NOTE) The Xpert Xpress SARS-CoV-2/FLU/RSV assay is intended as an aid in  the diagnosis of influenza from Nasopharyngeal swab specimens and  should not be used as a sole basis for treatment. Nasal washings and  aspirates are unacceptable for Xpert Xpress SARS-CoV-2/FLU/RSV  testing.  Fact Sheet for Patients: PinkCheek.be  Fact Sheet for Healthcare Providers: GravelBags.it  This test is not yet approved or cleared by the [place] FDA and  has been authorized for detection and/or diagnosis of SARS-CoV-2 by  FDA under an Emergency Use Authorization (EUA). This EUA will remain  in effect (meaning this test can be used) for the duration of the  Covid-19 declaration under Section 564(b)(1) of the Act, 21  U.S.C. section 360bbb-3(b)(1), unless the authorization is  terminated or revoked. Performed at  [facility], [place] [address], [place], [place] [address]   Culture, blood (Routine x 2)     Status: Abnormal   Collection Time: 09/27/2020 11:15 AM   Specimen:  BLOOD RIGHT FOREARM  Result Value Ref Range Status   Specimen Description BLOOD RIGHT FOREARM  Final   Special Requests   Final    BOTTLES DRAWN AEROBIC AND ANAEROBIC Blood Culture adequate volume   Culture  Setup Time   Final    GRAM POSITIVE COCCI IN CLUSTERS AEROBIC BOTTLE ONLY Organism ID to follow CRITICAL RESULT CALLED TO, READ BACK BY AND VERIFIED WITH: Kittie Plater PharmD 15:05 09/22/20 ([email or web address])    Culture (A)  Final    MICROCOCCUS SPECIES Standardized susceptibility testing for this organism is not available. Performed at [facility], [place] [address] [place], [place] [address]    Report Status 09/23/2020 FINAL  Final  Blood Culture ID Panel (Reflexed)     Status: None   Collection Time: 09/28/2020 11:15 AM  Result Value Ref Range Status   Enterococcus faecalis NOT DETECTED NOT DETECTED Final   Enterococcus Faecium NOT DETECTED NOT DETECTED Final   Listeria monocytogenes NOT DETECTED NOT DETECTED Final   Staphylococcus species NOT DETECTED NOT DETECTED Final   Staphylococcus aureus (BCID) NOT DETECTED NOT DETECTED Final   Staphylococcus epidermidis NOT DETECTED NOT DETECTED Final   Staphylococcus lugdunensis NOT DETECTED NOT DETECTED Final   Streptococcus species NOT DETECTED NOT DETECTED Final   Streptococcus agalactiae NOT DETECTED NOT DETECTED Final   Streptococcus pneumoniae NOT DETECTED NOT DETECTED Final   Streptococcus pyogenes NOT DETECTED NOT DETECTED Final   A.calcoaceticus-baumannii NOT DETECTED NOT DETECTED Final   Bacteroides fragilis NOT DETECTED NOT DETECTED Final   Enterobacterales NOT DETECTED NOT DETECTED Final   Enterobacter cloacae complex NOT DETECTED NOT DETECTED Final   Escherichia coli NOT DETECTED NOT DETECTED Final   Klebsiella aerogenes NOT DETECTED NOT DETECTED Final   Klebsiella oxytoca NOT DETECTED NOT DETECTED Final   Klebsiella pneumoniae NOT DETECTED NOT DETECTED Final   Proteus species NOT DETECTED NOT DETECTED Final   Salmonella  species NOT DETECTED NOT DETECTED Final   Serratia marcescens NOT DETECTED NOT DETECTED Final   Haemophilus influenzae NOT DETECTED NOT DETECTED Final   Neisseria meningitidis NOT DETECTED NOT DETECTED Final   Pseudomonas aeruginosa NOT DETECTED NOT DETECTED Final   Stenotrophomonas maltophilia NOT DETECTED NOT DETECTED Final   Candida albicans NOT DETECTED NOT DETECTED Final   Candida auris NOT DETECTED NOT DETECTED Final   Candida glabrata NOT DETECTED NOT DETECTED Final   Candida krusei NOT DETECTED NOT DETECTED Final   Candida parapsilosis NOT DETECTED NOT DETECTED Final   Candida tropicalis NOT DETECTED NOT DETECTED Final   Cryptococcus neoformans/gattii NOT DETECTED NOT DETECTED Final    Comment: Performed at [facility], [address], [place], [place] [address]  Culture, blood (Routine x 2)     Status: None   Collection Time: 10/09/2020 11:25 AM   Specimen: BLOOD LEFT FOREARM  Result Value Ref Range Status   Specimen Description BLOOD LEFT FOREARM  Final   Special Requests   Final    BOTTLES DRAWN AEROBIC AND ANAEROBIC Blood Culture results may not be optimal due to an inadequate volume of blood received in culture bottles   Culture   Final    NO GROWTH 5 DAYS Performed at [facility], [place] [address], [place],  [address]    Report Status 09/25/2020  FINAL  Final  MRSA PCR Screening     Status: None   Collection Time: 09/24/20  8:48 AM   Specimen: Nasal Mucosa; Nasopharyngeal  Result Value Ref Range Status   MRSA by PCR NEGATIVE NEGATIVE Final    Comment:        The GeneXpert MRSA Assay (FDA approved for NASAL specimens only), is one component of a comprehensive MRSA colonization surveillance program. It is not intended to diagnose MRSA infection nor to guide or monitor treatment for MRSA infections. Performed at [facility], [place] [address], [place], [place] [address]     Lab Basic Metabolic Panel: Recent Labs  Lab 09/22/20 0355  09/22/20 1752 09/23/20 0408 09/23/20 1540 09/24/20 0357 09/24/20 0357 09/24/20 1624 09/24/20 1848 09/25/20 0533 09/25/20 0533 09/25/20 1643 09/25/20 1702 October 04, 2020 0433 10-04-2020 0750 2020-10-04 1214  NA 132*   < > 135   < > 136   < > 140   < > 138   < > 138 138 138 137 137  K 4.8   < > 5.2*   < > 5.6*   < > 5.6*   < > 5.9*   < > 5.2* 5.3* 5.1 5.4* 6.1*  CL 99   < > 99  --  101  --  105  --  104  --   --  104 102  --   --   CO2 20*   < > 23  --  20*  --  21*  --  20*  --   --  21* 22  --   --   GLUCOSE 362*   < > 127*  --  301*  --  294*  --  266*  --   --  212* 151*  --   --   BUN 29*   < > 48*  --  82*  --  96*  --  114*  --   --  89* 73*  --   --   CREATININE 1.25*   < > 2.77*  --  5.47*  --  6.22*  --  7.64*  --   --  5.31* 4.41*  --   --   CALCIUM 8.9   < > 9.3  --  8.6*  --  7.9*  --  8.0*  --   --  7.8* 7.8*  --   --   MG 2.2  --  3.2*  --  2.9*  --   --   --  3.1*  --   --   --  2.6*  --   --   PHOS 3.4  --  6.0*  --  5.2*  --  5.7*  --  7.3*  --   --  5.3* 4.2  --   --    < > = values in this interval not displayed.   Liver Function Tests: Recent Labs  Lab 09/21/20 0517 09/21/20 0517 09/22/20 0355 09/22/20 0355 09/23/20 0408 09/23/20 0408 09/24/20 0357 09/24/20 1624 09/25/20 0533 09/25/20 1702 10-04-2020 0433  AST 107*  --  111*  --  86*  --  63*  --  91*  --   --   ALT 39  --  34  --  27  --  28  --  41  --   --   ALKPHOS 76  --  139*  --  186*  --  182*  --  227*  --   --  BILITOT 0.3  --  0.6  --  0.7  --  1.0  --  1.0  --   --   PROT 7.1  --  7.0  --  6.5  --  5.9*  --  5.6*  --   --   ALBUMIN 3.6   < > 3.4*   < > 3.3*   < > 3.1* 2.7* 2.7* 2.7* 2.6*   < > = values in this interval not displayed.   No results for input(s): LIPASE, AMYLASE in the last 168 hours. No results for input(s): AMMONIA in the last 168 hours. CBC: Recent Labs  Lab 09/21/20 0517 09/21/20 0517 09/22/20 0355 09/22/20 2011 09/23/20 0500 09/23/20 6440 09/24/20 0357 09/24/20 0357  09/24/20 1848 09/25/20 0533 09/25/20 1643 10/21/20 0750 21-Oct-2020 1214  WBC 5.4  --  7.4  --  8.2  --  11.8*  --   --  11.0*  --   --   --   NEUTROABS 4.4  --  6.1  --  6.3  --  9.2*  --   --  8.9*  --   --   --   HGB 14.8   < > 15.3   < > 14.7   < > 13.4   < > 12.9* 12.3* 14.3 12.6* 11.9*  HCT 47.9   < > 48.8   < > 48.4   < > 43.3   < > 38.0* 39.5 42.0 37.0* 35.0*  MCV 80.8  --  79.5*  --  82.3  --  83.3  --   --  83.3  --   --   --   PLT 201  --  195  --  223  --  287  --   --  191  --   --   --    < > = values in this interval not displayed.   Cardiac Enzymes: Recent Labs  Lab 09/25/2020 1658  CKTOTAL 188   Sepsis Labs: Recent Labs  Lab 09/22/2020 1635 09/21/20 0517 09/22/20 0355 09/23/20 0500 09/24/20 0357 09/25/20 0533  PROCALCITON 0.41  --   --   --   --   --   WBC  --    < > 7.4 8.2 11.8* 11.0*   < > = values in this interval not displayed.    Procedures/Operations  Mechanical ventilation, CRRT.   Giulio Bertino 09/27/2020, 2:07 PM

## 2020-10-10 NOTE — Progress Notes (Signed)
Fentanyl 40cc's and versed 5cc's wasted in stericycle, witnessed by Rex Kras, RN

## 2020-10-10 NOTE — Progress Notes (Addendum)
  Spoke with Carlos Bowman, who is with patient's wife Carlos Bowman.  Explained events of the morning including his sudden decompensation and plan based upon discussions between [place] (sister) and Dr. Lynetta Mare.  Randall Hiss and [place] both agree moving forward with comfort measures.  Offered elink visit given Carlos Bowman has COVID, however they felt best to remember him as he was.      Kennieth Rad, ACNP [facility] 2020/09/28, 1:18 PM

## 2020-10-10 NOTE — Progress Notes (Signed)
[facility] Progress Note Patient Name: Carlos Bowman. DOB: [birth year] MRN: [id number]   Date of Service  10-16-2020  HPI/Events of Note  Desatting to mid 75s in setting of ventilator asynchrony despite deep sedation and prone ventilation. On CRRT.   eICU Interventions  Obtain ABG (none since y/day).  Ordered intermittent neuromuscular blockade for ventilator synchrony.     Intervention Category Major Interventions: Respiratory failure - evaluation and management  Charlott Rakes 2020-10-16, 6:52 AM

## 2020-10-10 NOTE — Progress Notes (Signed)
Pt asystole on heart monitor, heart and lung sounds auscultated. pt pronounced at 1401 by Daron Offer, RN and Rex Kras. Dr. Lorina Rabon, NP notified, as well as pt's Wife Estill Bamberg and Arna Medici.

## 2020-10-10 DEATH — deceased

## 2021-02-14 IMAGING — CT CT CHEST HIGH RESOLUTION W/O CM
1 of 2 series · 14 of 32 positions shown, 18 images · non-contrast
Comparison: 04/22/2020.

CLINICAL DATA: Interstitial lung disease, worsening shortness of
breath.

EXAM:
CT CHEST WITHOUT CONTRAST
TECHNIQUE: Multidetector CT imaging of the chest was performed following the
standard protocol without intravenous contrast. High resolution
imaging of the lungs, as well as inspiratory and expiratory imaging,
was performed.

[Series 2: chest · axial · 0.66mm/px · z∈[+1092,+1332]mm · 14 of 134 slices shown, 18 images]
[im 7/134  mediastinal]
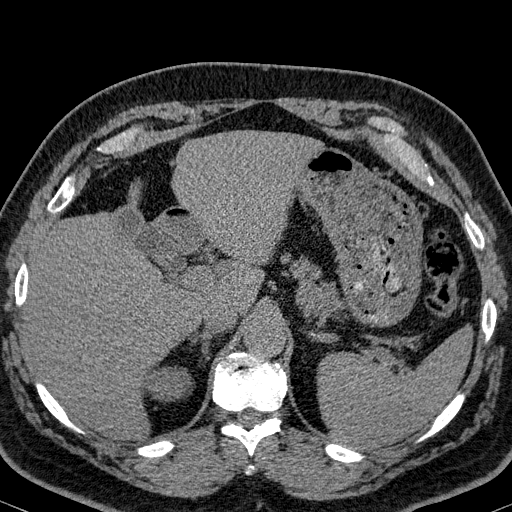
[im 7/134  lung]
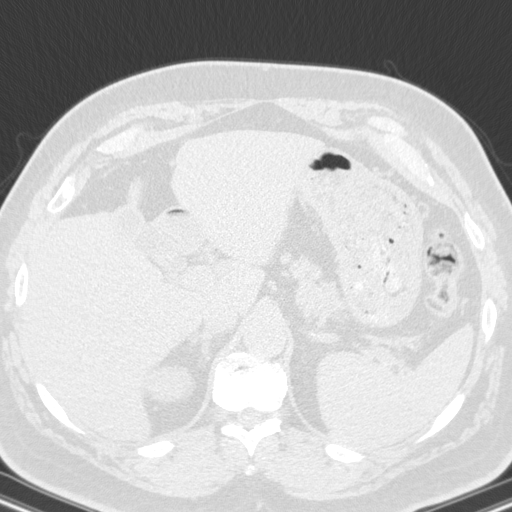
[im 19/134  lung]
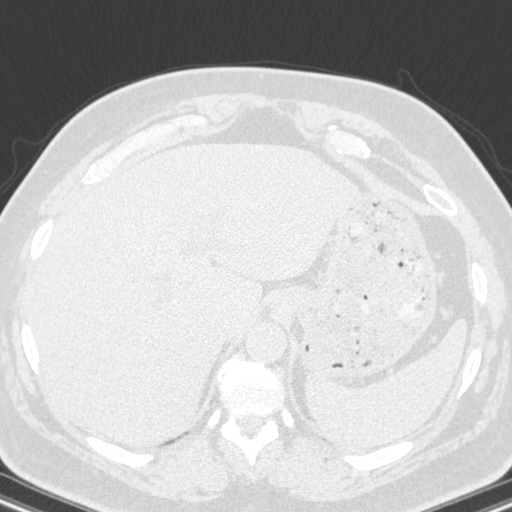
[im 31/134  lung]
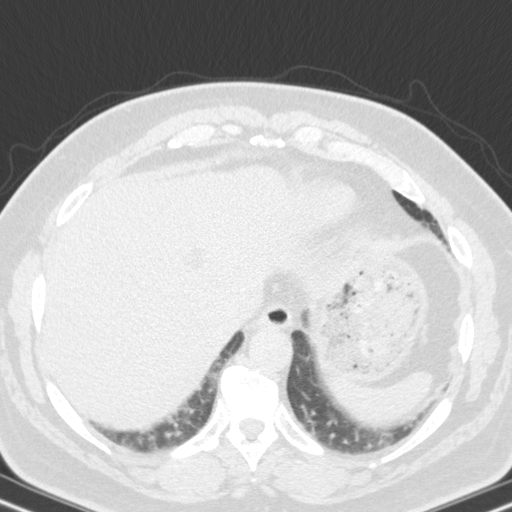
[im 37/134  lung]
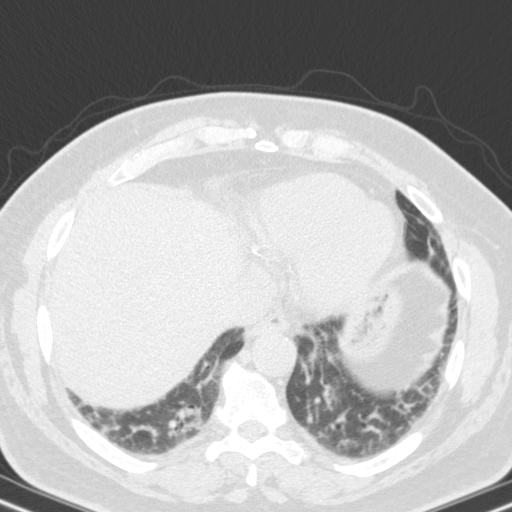
[im 43/134  mediastinal]
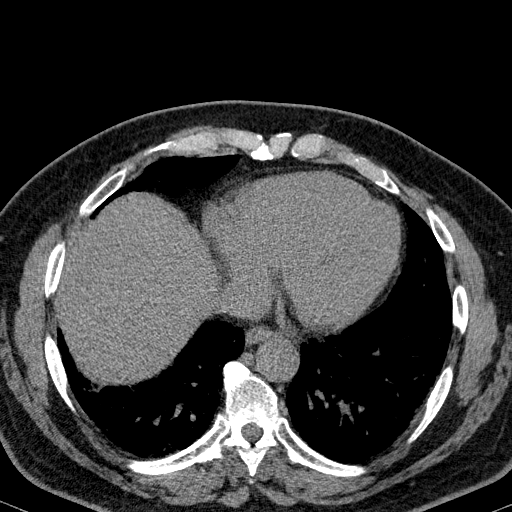
[im 43/134  lung]
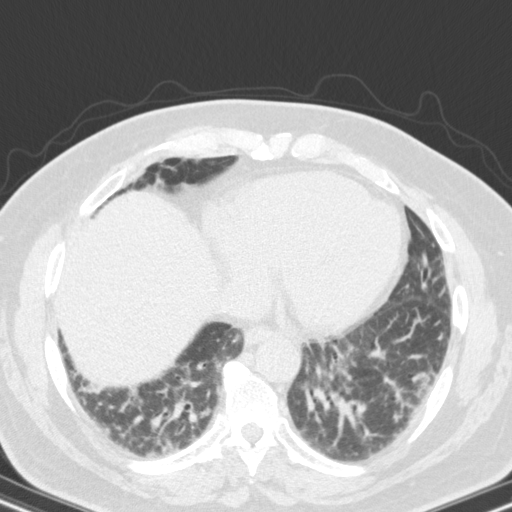
[im 55/134  lung]
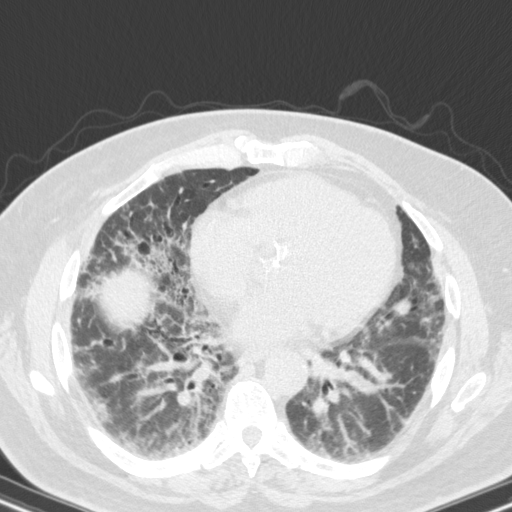
[im 63/134  lung]
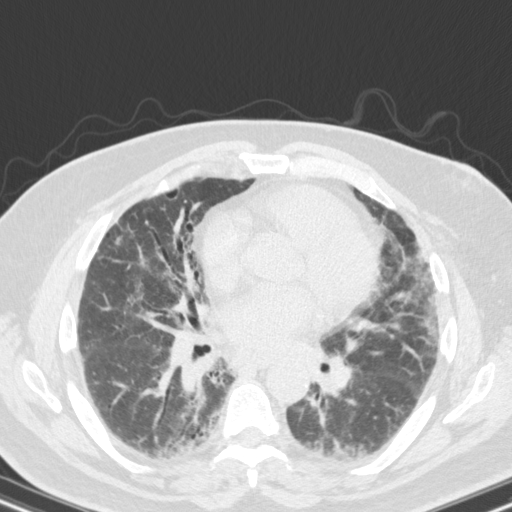
[im 67/134  lung]
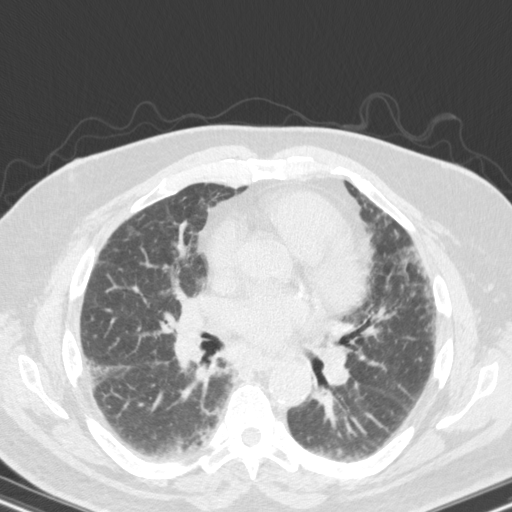
[im 79/134  mediastinal]
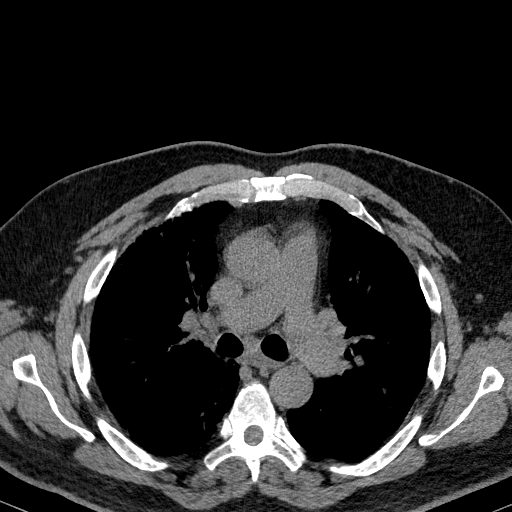
[im 79/134  lung]
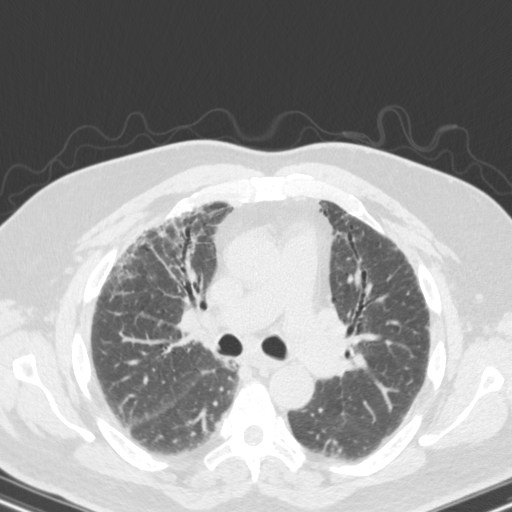
[im 91/134  lung]
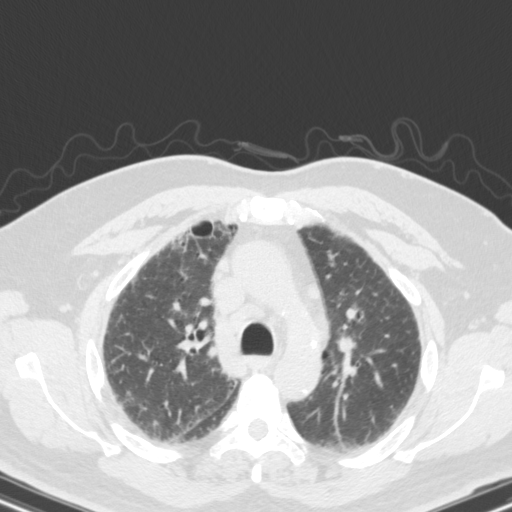
[im 100/134  lung]
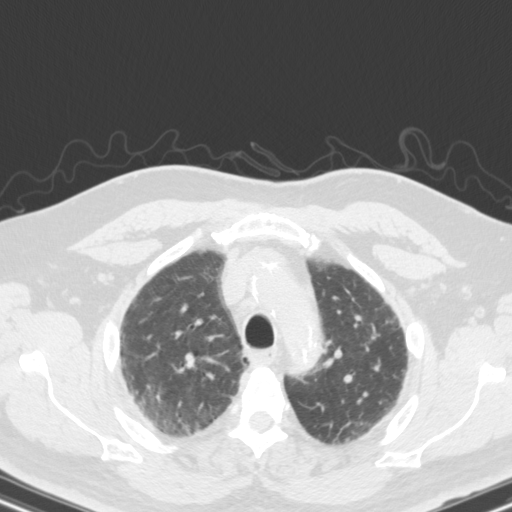
[im 103/134  lung]
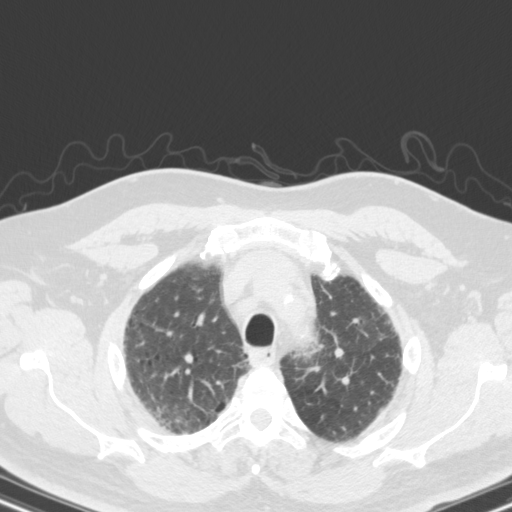
[im 115/134  mediastinal]
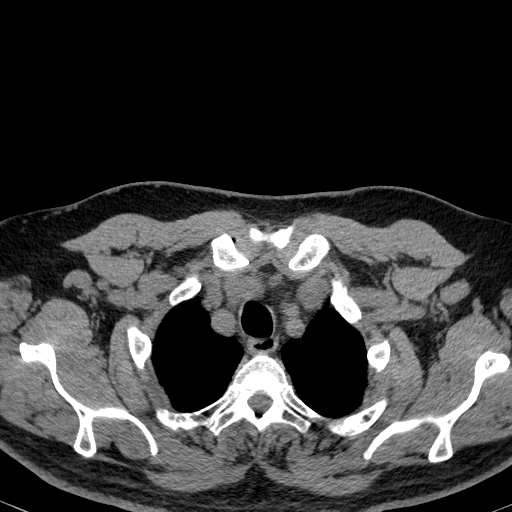
[im 115/134  lung]
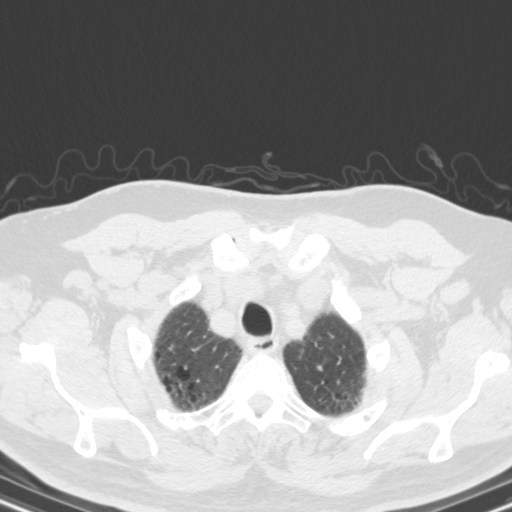
[im 127/134  lung]
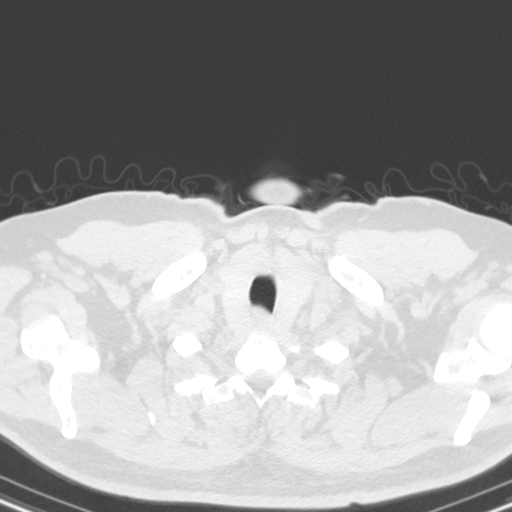

[14 of 32 positions shown; findings below may reference images not displayed]

FINDINGS: Cardiovascular: Atherosclerotic calcification of the aorta, aortic
valve and coronary arteries. Right and left pulmonary arteries and
heart are enlarged. No pericardial effusion.

Mediastinum/Nodes: Mediastinal lymph nodes measure up to 10 mm in
the low right paratracheal station, as before. Hilar regions are
difficult to definitively evaluate without IV contrast. No axillary
adenopathy. Esophagus is grossly unremarkable.

Lungs/Pleura: Centrilobular and paraseptal emphysema. Image quality
is degraded by respiratory motion. Mid and lower lung zone
predominant traction bronchiectasis/bronchiolectasis, interstitial
thickening/coarsening and subpleural ground-glass/reticulation, as
on 04/22/2020. No definite air trapping. No pleural fluid. Airway is
unremarkable.

Upper Abdomen: Low-attenuation lesions in the liver measure up to
1.6 cm, unchanged and likely cysts. Visualized portions of the
liver, gallbladder, adrenal glands, right kidney, spleen, pancreas,
stomach and bowel are otherwise unremarkable.

Musculoskeletal: Degenerative changes in the spine. No worrisome
lytic or sclerotic lesions.
IMPRESSION: 1. Pulmonary parenchymal pattern of fibrosis may be due to fibrotic
nonspecific interstitial pneumonitis or usual interstitial
pneumonitis. Findings are indeterminate for UIP per consensus
guidelines: Diagnosis of Idiopathic Pulmonary Fibrosis: An Official
ATS/ERS/JRS/ALAT Clinical Practice Guideline. Am J Respir Crit Care
Med Vol 198, Jun Tae 5, [id number][DATE].
2. Borderline enlarged mediastinal lymph nodes can be seen in the
setting of interstitial lung disease.
3. Aortic atherosclerosis (L26J0-XPF.F). Coronary artery
calcification.
4. Enlarged pulmonary arteries, indicative of pulmonary arterial
hypertension.
5.  Emphysema ([id number].X).

## 2021-04-15 IMAGING — DX DG CHEST 1V PORT
1 series · 1 of 1 positions shown · non-contrast
Comparison: Chest radiograph 04/26/2019 and chest CT 07/22/2020

CLINICAL DATA: Fatigue, shortness of breath, and fever.  Hypoxia.

EXAM:
PORTABLE CHEST 1 VIEW

[chest ap]
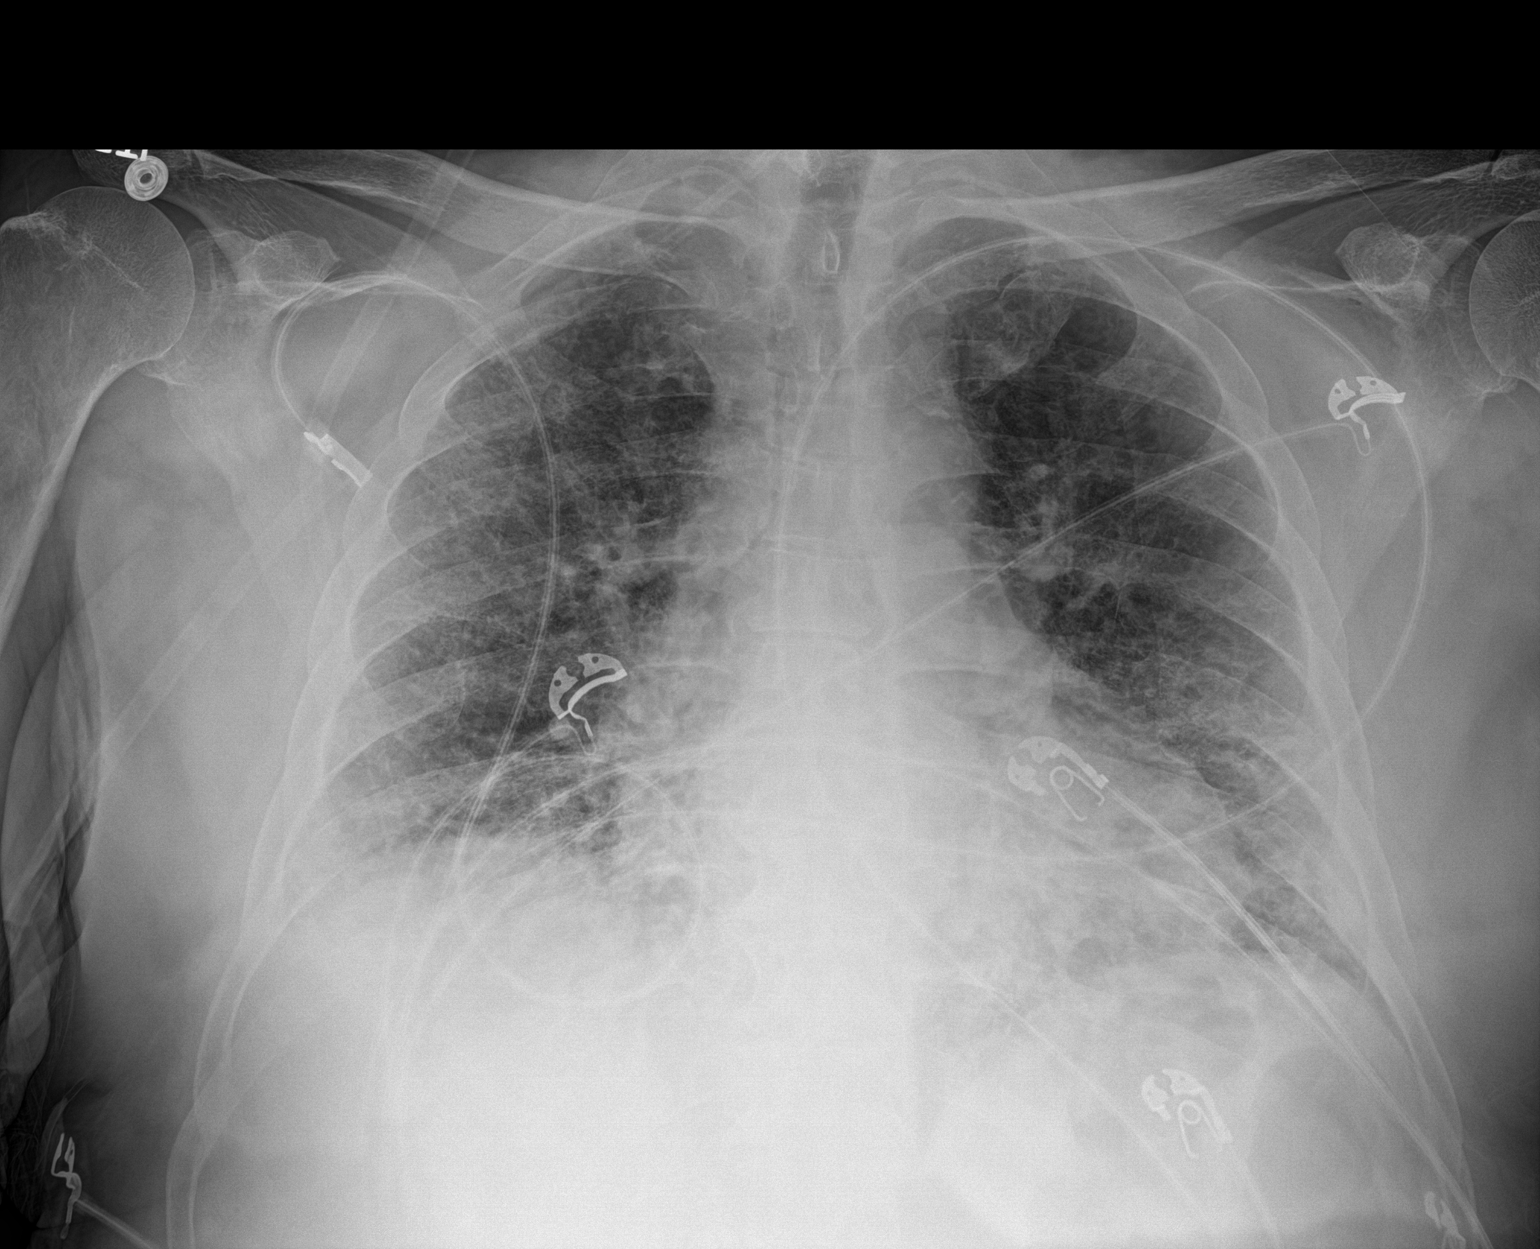

[1 of 1 positions shown; findings below may reference images not displayed]

FINDINGS: The cardiac silhouette is upper limits of normal in size. The lungs
are mildly hypoinflated. Interstitial densities are present in the
lungs bilaterally, greatest in the bases. These have increased from
the 1616 chest radiograph, most notably in the right upper lobe,
however there is likely less of a change compared to the interval
CT. No sizable pleural effusion or pneumothorax is identified. No
acute osseous abnormality is seen.
IMPRESSION: Chronic interstitial lung disease, possibly with superimposed
atypical or viral infection.

## 2021-04-17 IMAGING — DX DG CHEST 1V PORT
1 series · 1 of 1 positions shown · non-contrast
Comparison: Film from earlier in the same day.

CLINICAL DATA: Status post intubation

EXAM:
PORTABLE CHEST 1 VIEW

[chest ap]
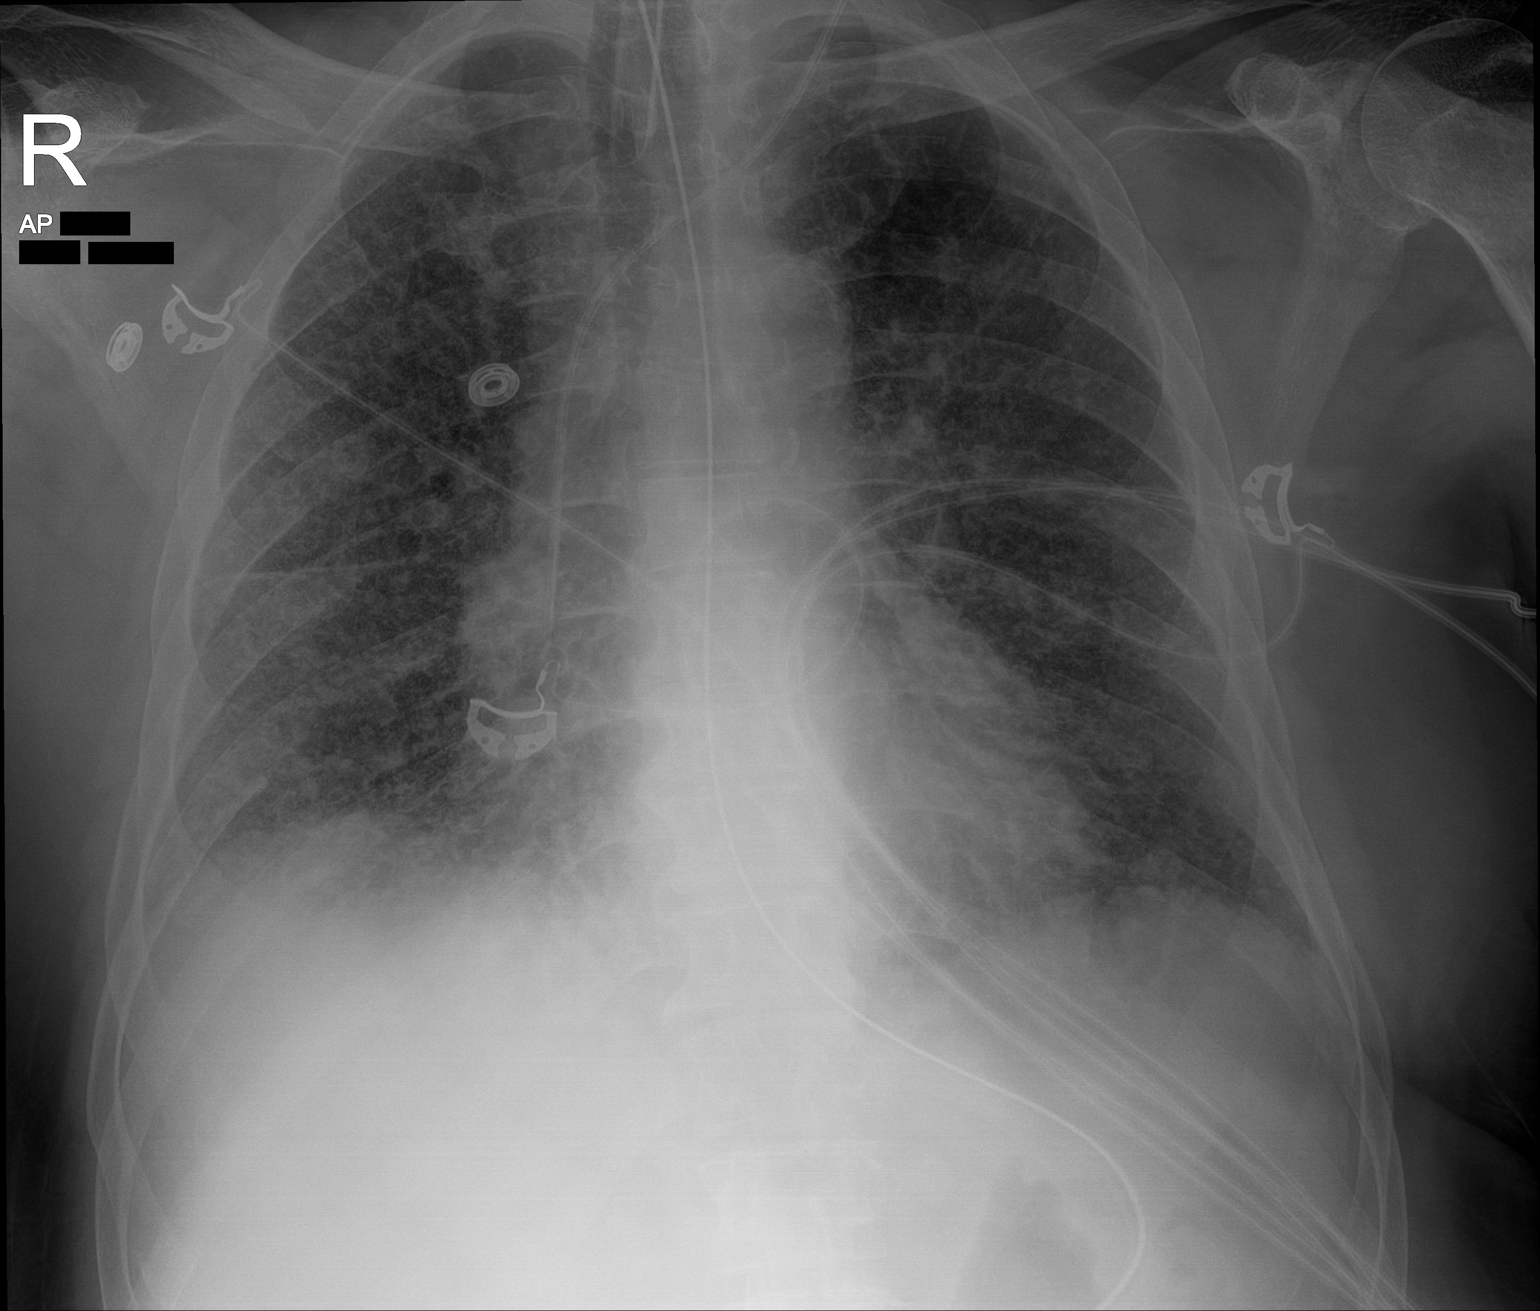

[1 of 1 positions shown; findings below may reference images not displayed]

FINDINGS: Cardiac shadow is stable. Endotracheal tube and gastric catheter are
noted in satisfactory position. Left jugular central line is noted
with the tip at the cavoatrial junction. No pneumothorax is seen.
Diffuse bilateral airspace opacities are again identified but
slightly improved following intubation consistent with the given
clinical history.
IMPRESSION: Improved aeration.

Tubes and lines as described above without complicating factors.

## 2021-04-19 IMAGING — DX DG CHEST 1V PORT
1 series · 1 of 1 positions shown · non-contrast
Comparison: 09/22/2020

CLINICAL DATA: Central line placement

EXAM:
PORTABLE CHEST 1 VIEW

[chest ap]
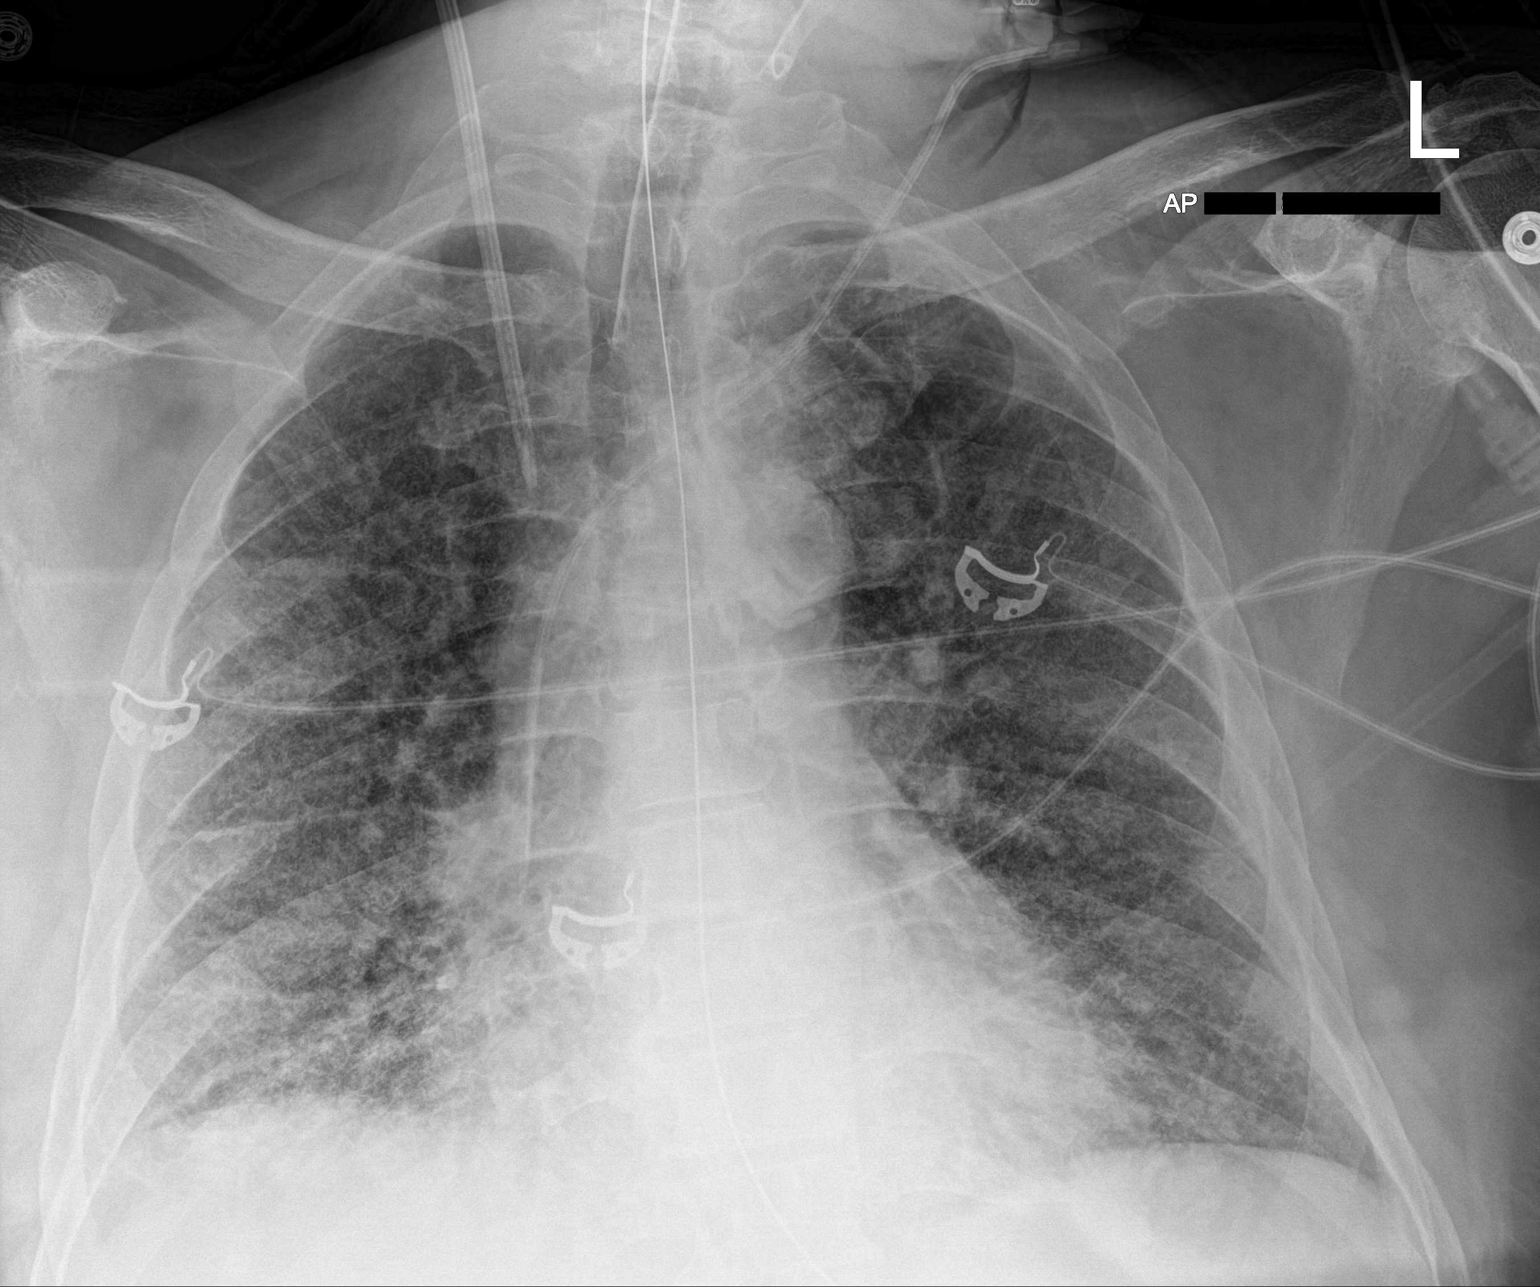

[1 of 1 positions shown; findings below may reference images not displayed]

FINDINGS: Left central line, endotracheal tube and NG tube remain in place,
unchanged. Placement of right Central line with the tip in the
inferior right innominate vein. No pneumothorax. Heart is normal
size. Diffuse interstitial prominence throughout the lungs. No real
change since prior study.
IMPRESSION: Interval placement of right central line with the tip in the lower
right innominate vein near the upper SVC. Otherwise no change.
